# Patient Record
Sex: Female | Born: 1935 | Race: White | Hispanic: No | Smoking: Former smoker
Health system: Southern US, Community
[De-identification: ages and names within clinical notes are randomized; demographics above are authoritative.]

## PROBLEM LIST (undated history)

## (undated) DIAGNOSIS — M48061 Spinal stenosis, lumbar region without neurogenic claudication: Secondary | ICD-10-CM

## (undated) DIAGNOSIS — O88219 Thromboembolism in pregnancy, unspecified trimester: Secondary | ICD-10-CM

## (undated) DIAGNOSIS — I219 Acute myocardial infarction, unspecified: Secondary | ICD-10-CM

## (undated) DIAGNOSIS — E785 Hyperlipidemia, unspecified: Secondary | ICD-10-CM

## (undated) DIAGNOSIS — M1990 Unspecified osteoarthritis, unspecified site: Secondary | ICD-10-CM

## (undated) DIAGNOSIS — N6019 Diffuse cystic mastopathy of unspecified breast: Secondary | ICD-10-CM

## (undated) DIAGNOSIS — Z9889 Other specified postprocedural states: Secondary | ICD-10-CM

## (undated) DIAGNOSIS — K2970 Gastritis, unspecified, without bleeding: Secondary | ICD-10-CM

## (undated) HISTORY — PX: BREAST SURGERY: SHX581

## (undated) HISTORY — PX: ABDOMINAL HYSTERECTOMY: SHX81

## (undated) HISTORY — PX: APPENDECTOMY: SHX54

---
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
## 1978-09-12 DIAGNOSIS — Z9889 Other specified postprocedural states: Secondary | ICD-10-CM

## 1978-09-12 HISTORY — PX: GUM SURGERY: SHX658

## 1978-09-12 HISTORY — PX: KNEE ARTHROSCOPY: SHX127

## 1978-09-12 HISTORY — DX: Other specified postprocedural states: Z98.890

## 2004-05-08 ENCOUNTER — Ambulatory Visit: Payer: Self-pay | Admitting: Internal Medicine

## 2004-05-18 ENCOUNTER — Ambulatory Visit: Payer: Self-pay | Admitting: Internal Medicine

## 2005-01-11 HISTORY — PX: CARPAL TUNNEL RELEASE: SHX101

## 2005-03-12 ENCOUNTER — Ambulatory Visit: Payer: Self-pay | Admitting: Orthopaedic Surgery

## 2005-05-18 ENCOUNTER — Ambulatory Visit: Payer: Self-pay | Admitting: Internal Medicine

## 2005-05-19 ENCOUNTER — Encounter: Payer: Self-pay | Admitting: Orthopaedic Surgery

## 2005-06-11 ENCOUNTER — Encounter: Payer: Self-pay | Admitting: Orthopaedic Surgery

## 2005-08-06 ENCOUNTER — Ambulatory Visit: Payer: Self-pay | Admitting: Unknown Physician Specialty

## 2005-09-29 ENCOUNTER — Emergency Department: Payer: Self-pay | Admitting: Emergency Medicine

## 2006-01-11 DIAGNOSIS — I219 Acute myocardial infarction, unspecified: Secondary | ICD-10-CM

## 2006-01-11 HISTORY — DX: Acute myocardial infarction, unspecified: I21.9

## 2006-05-26 ENCOUNTER — Ambulatory Visit: Payer: Self-pay | Admitting: Internal Medicine

## 2006-06-09 ENCOUNTER — Ambulatory Visit: Payer: Self-pay | Admitting: Internal Medicine

## 2007-06-13 ENCOUNTER — Ambulatory Visit: Payer: Self-pay | Admitting: Internal Medicine

## 2007-12-27 ENCOUNTER — Ambulatory Visit: Payer: Self-pay | Admitting: Surgery

## 2008-01-03 ENCOUNTER — Ambulatory Visit: Payer: Self-pay | Admitting: Surgery

## 2008-06-13 ENCOUNTER — Ambulatory Visit: Payer: Self-pay | Admitting: Internal Medicine

## 2009-05-29 ENCOUNTER — Ambulatory Visit: Payer: Self-pay | Admitting: Internal Medicine

## 2010-06-04 ENCOUNTER — Ambulatory Visit: Payer: Self-pay | Admitting: Internal Medicine

## 2012-04-11 ENCOUNTER — Encounter: Payer: Self-pay | Admitting: Specialist

## 2012-05-11 ENCOUNTER — Encounter: Payer: Self-pay | Admitting: Specialist

## 2014-03-21 ENCOUNTER — Ambulatory Visit: Payer: Self-pay | Admitting: Unknown Physician Specialty

## 2014-03-26 ENCOUNTER — Encounter
Admit: 2014-03-26 | Disposition: A | Discharge status: Home / Self Care | Payer: Self-pay | Attending: Family Medicine | Admitting: Family Medicine

## 2014-04-12 ENCOUNTER — Encounter
Admit: 2014-04-12 | Disposition: A | Discharge status: Home / Self Care | Payer: Self-pay | Attending: Family Medicine | Admitting: Family Medicine

## 2014-06-11 ENCOUNTER — Other Ambulatory Visit: Payer: Self-pay | Admitting: Internal Medicine

## 2014-06-11 DIAGNOSIS — M5416 Radiculopathy, lumbar region: Secondary | ICD-10-CM

## 2014-06-17 ENCOUNTER — Ambulatory Visit
Admission: RE | Admit: 2014-06-17 | Discharge: 2014-06-17 | Disposition: A | Discharge status: Home / Self Care | Payer: Medicare Other | Source: Ambulatory Visit | Attending: Internal Medicine | Admitting: Internal Medicine

## 2014-06-17 DIAGNOSIS — M438X6 Other specified deforming dorsopathies, lumbar region: Secondary | ICD-10-CM | POA: Diagnosis not present

## 2014-06-17 DIAGNOSIS — M5416 Radiculopathy, lumbar region: Secondary | ICD-10-CM

## 2014-06-17 DIAGNOSIS — M5116 Intervertebral disc disorders with radiculopathy, lumbar region: Secondary | ICD-10-CM | POA: Diagnosis not present

## 2014-06-17 DIAGNOSIS — M4806 Spinal stenosis, lumbar region: Secondary | ICD-10-CM | POA: Insufficient documentation

## 2015-03-26 ENCOUNTER — Encounter
Admission: RE | Admit: 2015-03-26 | Discharge: 2015-03-26 | Disposition: A | Discharge status: Home / Self Care | Payer: Medicare Other | Source: Ambulatory Visit | Attending: Orthopedic Surgery | Admitting: Orthopedic Surgery

## 2015-03-26 DIAGNOSIS — Z01812 Encounter for preprocedural laboratory examination: Secondary | ICD-10-CM | POA: Diagnosis present

## 2015-03-26 HISTORY — DX: Acute myocardial infarction, unspecified: I21.9

## 2015-03-26 HISTORY — DX: Other specified postprocedural states: Z98.890

## 2015-03-26 HISTORY — DX: Thromboembolism in pregnancy, unspecified trimester: O88.219

## 2015-03-26 HISTORY — DX: Diffuse cystic mastopathy of unspecified breast: N60.19

## 2015-03-26 HISTORY — DX: Hyperlipidemia, unspecified: E78.5

## 2015-03-26 HISTORY — DX: Unspecified osteoarthritis, unspecified site: M19.90

## 2015-03-26 HISTORY — DX: Gastritis, unspecified, without bleeding: K29.70

## 2015-03-26 HISTORY — DX: Spinal stenosis, lumbar region without neurogenic claudication: M48.061

## 2015-03-26 LAB — SURGICAL PCR SCREEN
MRSA, PCR: NEGATIVE
Staphylococcus aureus: NEGATIVE

## 2015-03-26 LAB — URINALYSIS COMPLETE WITH MICROSCOPIC (ARMC ONLY)
BACTERIA UA: NONE SEEN
Bilirubin Urine: NEGATIVE
Glucose, UA: NEGATIVE mg/dL
HGB URINE DIPSTICK: NEGATIVE
Ketones, ur: NEGATIVE mg/dL
LEUKOCYTES UA: NEGATIVE
NITRITE: NEGATIVE
PH: 8 (ref 5.0–8.0)
PROTEIN: NEGATIVE mg/dL
SQUAMOUS EPITHELIAL / LPF: NONE SEEN
Specific Gravity, Urine: 1.006 (ref 1.005–1.030)
WBC, UA: NONE SEEN WBC/hpf (ref 0–5)

## 2015-03-26 LAB — TYPE AND SCREEN
ABO/RH(D): B POS
ANTIBODY SCREEN: NEGATIVE

## 2015-03-26 LAB — BASIC METABOLIC PANEL
Anion gap: 8 (ref 5–15)
BUN: 12 mg/dL (ref 6–20)
CHLORIDE: 101 mmol/L (ref 101–111)
CO2: 28 mmol/L (ref 22–32)
CREATININE: 0.77 mg/dL (ref 0.44–1.00)
Calcium: 9.3 mg/dL (ref 8.9–10.3)
Glucose, Bld: 97 mg/dL (ref 65–99)
POTASSIUM: 4.2 mmol/L (ref 3.5–5.1)
SODIUM: 137 mmol/L (ref 135–145)

## 2015-03-26 LAB — CBC
HCT: 41.8 % (ref 35.0–47.0)
HEMOGLOBIN: 14.3 g/dL (ref 12.0–16.0)
MCH: 31.6 pg (ref 26.0–34.0)
MCHC: 34.3 g/dL (ref 32.0–36.0)
MCV: 92.1 fL (ref 80.0–100.0)
PLATELETS: 243 10*3/uL (ref 150–440)
RBC: 4.54 MIL/uL (ref 3.80–5.20)
RDW: 12.9 % (ref 11.5–14.5)
WBC: 5 10*3/uL (ref 3.6–11.0)

## 2015-03-26 LAB — SEDIMENTATION RATE: SED RATE: 11 mm/h (ref 0–30)

## 2015-03-26 LAB — PROTIME-INR
INR: 0.99
PROTHROMBIN TIME: 13.3 s (ref 11.4–15.0)

## 2015-03-26 LAB — ABO/RH: ABO/RH(D): B POS

## 2015-03-26 LAB — APTT: aPTT: 25 seconds (ref 24–36)

## 2015-03-26 NOTE — Patient Instructions (Signed)
  Your procedure is scheduled on: Wednesday April 09 2015. Report to Same Day Surgery. To find out your arrival time please call [phone] between 1PM - 3PM on Tuesday April 08, 2015.  Remember: Instructions that are not followed completely may result in serious medical risk, up to and including death, or upon the discretion of your surgeon and anesthesiologist your surgery may need to be rescheduled.    _x___ 1. Do not eat food or drink liquids after midnight. No gum chewing or hard candies.     ____ 2. No Alcohol for 24 hours before or after surgery.   ____ 3. Bring all medications with you on the day of surgery if instructed.    __x__ 4. Notify your doctor if there is any change in your medical condition     (cold, fever, infections).     Do not wear jewelry, make-up, hairpins, clips or nail polish.  Do not wear lotions, powders, or perfumes. You may wear deodorant.  Do not shave 48 hours prior to surgery. Men may shave face and neck.  Do not bring valuables to the hospital.    [facility] is not responsible for any belongings or valuables.               Contacts, dentures or bridgework may not be worn into surgery.  Leave your suitcase in the car. After surgery it may be brought to your room.  For patients admitted to the hospital, discharge time is determined by your treatment team.   Patients discharged the day of surgery will not be allowed to drive home.    Please read over the following fact sheets that you were given:   [facility] Preparing for Surgery  __x__ Take these medicines the morning of surgery with A SIP OF WATER:    1. gabapentin (NEURONTIN)    ____ Fleet Enema (as directed)   _x___ Use CHG Soap as directed  ____ Use inhalers on the day of surgery  ____ Stop metformin 2 days prior to surgery    ____ Take 1/2 of usual insulin dose the night before surgery and none on the morning of surgery.   ____ Stop Coumadin/Plavix/aspirin on does not  apply.  _x___ Stop Anti-inflammatories on Advil, aleve, ibuprofen, Motrin, aspirin products.  OK to take Tylenol for pain.   _x___ Stop supplements Vitamin C and Multivitamin until after surgery.    ____ Bring C-Pap to the hospital.

## 2015-03-26 NOTE — Pre-Procedure Instructions (Signed)
ECG 12-lead3/09/2015  [place]  ECG 12-lead3/09/2015  [place]  Component Name Value Range  Vent Rate (bpm) 82   PR Interval (msec) 158   QRS Interval (msec) 106   QT Interval (msec) 356   QTc (msec) 415    Result Narrative  Normal sinus rhythm Incomplete right bundle branch block ST and T wave abnormality, consider anterolateral ischemia Abnormal ECG , no significant change from prior ECG When compared with ECG of 01-Jul-2014 14:08, Criteria for Septal infarct are no longer present I reviewed and concur with this report. Electronically signed [id number] MD, [place] [phone]) on 03/25/2015 6:35:18 PM   Status Results Details   Encounter Summary

## 2015-03-26 NOTE — Pre-Procedure Instructions (Signed)
Pt reports she has 4 small sores on her left lower leg.  2 look as if they are healing and scabbed over, 2 look pink, swollen and open, no drainage noted.  Spoke with Margaretha Sheffield Dr. Clydell Hakim nurse regarding sore, she instructed me to bring pt over for Barnet Pall to look at sore ASAP. Took pt and daughter in law over to see Barnet Pall.  Wille Glaser said the sore look ok and we can proceed with surgery.

## 2015-03-27 NOTE — Pre-Procedure Instructions (Signed)
Medical clearance from Dr. Ouida Sills is on chart given to this nurse by patient and daughter in law.

## 2015-03-28 LAB — URINE CULTURE: SPECIAL REQUESTS: NORMAL

## 2015-03-31 NOTE — Pre-Procedure Instructions (Signed)
URINE CULTURE FAXED TO DR Maree Krabbe OFFICE

## 2015-04-02 NOTE — Pre-Procedure Instructions (Signed)
NO REPEAT URINE CULTURE NEEDED PER ELINE RN AT DR Maree Krabbe OFFICE

## 2015-04-09 ENCOUNTER — Encounter: Admission: RE | Payer: Self-pay | Source: Ambulatory Visit

## 2015-04-09 ENCOUNTER — Inpatient Hospital Stay: Admission: RE | Admit: 2015-04-09 | Payer: Medicare Other | Source: Ambulatory Visit | Admitting: Orthopedic Surgery

## 2015-04-09 SURGERY — ARTHROPLASTY, KNEE, TOTAL, USING IMAGELESS COMPUTER-ASSISTED NAVIGATION
Anesthesia: Choice | Laterality: Left

## 2016-06-08 ENCOUNTER — Ambulatory Visit
Admission: RE | Admit: 2016-06-08 | Discharge: 2016-06-08 | Disposition: A | Discharge status: Home / Self Care | Payer: Medicare Other | Source: Ambulatory Visit | Attending: Family Medicine | Admitting: Family Medicine

## 2016-06-08 ENCOUNTER — Other Ambulatory Visit: Payer: Self-pay | Admitting: Family Medicine

## 2016-06-08 DIAGNOSIS — M25552 Pain in left hip: Secondary | ICD-10-CM

## 2016-06-08 DIAGNOSIS — M8580 Other specified disorders of bone density and structure, unspecified site: Secondary | ICD-10-CM | POA: Insufficient documentation

## 2016-06-08 DIAGNOSIS — M25452 Effusion, left hip: Secondary | ICD-10-CM | POA: Insufficient documentation

## 2016-06-24 ENCOUNTER — Other Ambulatory Visit: Payer: Self-pay | Admitting: Internal Medicine

## 2016-06-24 ENCOUNTER — Ambulatory Visit
Admission: RE | Admit: 2016-06-24 | Discharge: 2016-06-24 | Disposition: A | Discharge status: Home / Self Care | Payer: Medicare Other | Source: Ambulatory Visit | Attending: Internal Medicine | Admitting: Internal Medicine

## 2016-06-24 DIAGNOSIS — M5416 Radiculopathy, lumbar region: Secondary | ICD-10-CM

## 2016-06-24 DIAGNOSIS — M48061 Spinal stenosis, lumbar region without neurogenic claudication: Secondary | ICD-10-CM | POA: Diagnosis not present

## 2016-08-13 ENCOUNTER — Emergency Department: Payer: Medicare Other

## 2016-08-13 ENCOUNTER — Encounter: Payer: Self-pay | Admitting: Intensive Care

## 2016-08-13 ENCOUNTER — Inpatient Hospital Stay
Admission: EM | Admit: 2016-08-13 | Discharge: 2016-08-16 | DRG: 470 | Disposition: A | Discharge status: Skilled Nursing Facility | Payer: Medicare Other | Attending: Internal Medicine | Admitting: Internal Medicine

## 2016-08-13 DIAGNOSIS — S72002A Fracture of unspecified part of neck of left femur, initial encounter for closed fracture: Secondary | ICD-10-CM | POA: Diagnosis present

## 2016-08-13 DIAGNOSIS — S72012A Unspecified intracapsular fracture of left femur, initial encounter for closed fracture: Principal | ICD-10-CM | POA: Diagnosis present

## 2016-08-13 DIAGNOSIS — W06XXXA Fall from bed, initial encounter: Secondary | ICD-10-CM | POA: Diagnosis present

## 2016-08-13 DIAGNOSIS — Z87891 Personal history of nicotine dependence: Secondary | ICD-10-CM | POA: Diagnosis not present

## 2016-08-13 DIAGNOSIS — M810 Age-related osteoporosis without current pathological fracture: Secondary | ICD-10-CM | POA: Diagnosis present

## 2016-08-13 DIAGNOSIS — Y92129 Unspecified place in nursing home as the place of occurrence of the external cause: Secondary | ICD-10-CM

## 2016-08-13 DIAGNOSIS — Z09 Encounter for follow-up examination after completed treatment for conditions other than malignant neoplasm: Secondary | ICD-10-CM

## 2016-08-13 DIAGNOSIS — I252 Old myocardial infarction: Secondary | ICD-10-CM

## 2016-08-13 LAB — COMPREHENSIVE METABOLIC PANEL
ALBUMIN: 3.4 g/dL — AB (ref 3.5–5.0)
ALK PHOS: 62 U/L (ref 38–126)
ALT: 18 U/L (ref 14–54)
ANION GAP: 6 (ref 5–15)
AST: 25 U/L (ref 15–41)
BUN: 19 mg/dL (ref 6–20)
CALCIUM: 8.8 mg/dL — AB (ref 8.9–10.3)
CHLORIDE: 102 mmol/L (ref 101–111)
CO2: 28 mmol/L (ref 22–32)
Creatinine, Ser: 0.85 mg/dL (ref 0.44–1.00)
GFR calc Af Amer: >60 mL/min (ref >60)
GFR calc non Af Amer: 59 mL/min — ABNORMAL LOW (ref >60)
Glucose, Bld: 98 mg/dL (ref 65–99)
Potassium: 4.2 mmol/L (ref 3.5–5.1)
SODIUM: 136 mmol/L (ref 135–145)
Total Bilirubin: 0.4 mg/dL (ref 0.3–1.2)
Total Protein: 6.9 g/dL (ref 6.5–8.1)

## 2016-08-13 LAB — URINALYSIS, ROUTINE W REFLEX MICROSCOPIC
Bilirubin Urine: NEGATIVE
GLUCOSE, UA: NEGATIVE mg/dL
HGB URINE DIPSTICK: NEGATIVE
KETONES UR: NEGATIVE mg/dL
NITRITE: NEGATIVE
PROTEIN: NEGATIVE mg/dL
RBC / HPF: NONE SEEN RBC/hpf (ref 0–5)
Specific Gravity, Urine: 1.005 (ref 1.005–1.030)
pH: 7 (ref 5.0–8.0)

## 2016-08-13 LAB — CBC WITH DIFFERENTIAL/PLATELET
BASOS PCT: 1 %
Basophils Absolute: 0.1 10*3/uL (ref 0–0.1)
EOS ABS: 0.1 10*3/uL (ref 0–0.7)
Eosinophils Relative: 2 %
HCT: 37.4 % (ref 35.0–47.0)
HEMOGLOBIN: 12.7 g/dL (ref 12.0–16.0)
Lymphocytes Relative: 14 %
Lymphs Abs: 1.1 10*3/uL (ref 1.0–3.6)
MCH: 31.3 pg (ref 26.0–34.0)
MCHC: 34 g/dL (ref 32.0–36.0)
MCV: 92.2 fL (ref 80.0–100.0)
Monocytes Absolute: 1 10*3/uL — ABNORMAL HIGH (ref 0.2–0.9)
Monocytes Relative: 12 %
NEUTROS PCT: 71 %
Neutro Abs: 5.6 10*3/uL (ref 1.4–6.5)
Platelets: 365 10*3/uL (ref 150–440)
RBC: 4.05 MIL/uL (ref 3.80–5.20)
RDW: 13.5 % (ref 11.5–14.5)
WBC: 7.8 10*3/uL (ref 3.6–11.0)

## 2016-08-13 LAB — SURGICAL PCR SCREEN
MRSA, PCR: NEGATIVE
Staphylococcus aureus: NEGATIVE

## 2016-08-13 MED ORDER — VITAMIN C 500 MG PO TABS
500.0000 mg | ORAL_TABLET | ORAL | Status: DC
  Filled 2016-08-13: qty 1

## 2016-08-13 MED ORDER — ONDANSETRON HCL 4 MG/2ML IJ SOLN: 4.0000 mg | Freq: Four times a day (QID) | INTRAMUSCULAR | Status: DC | PRN

## 2016-08-13 MED ORDER — ONDANSETRON HCL 4 MG PO TABS: 4.0000 mg | ORAL_TABLET | Freq: Four times a day (QID) | ORAL | Status: DC | PRN

## 2016-08-13 MED ORDER — MORPHINE SULFATE (PF) 2 MG/ML IV SOLN: 2.0000 mg | INTRAVENOUS | Status: DC | PRN

## 2016-08-13 MED ORDER — DOCUSATE SODIUM 100 MG PO CAPS
200.0000 mg | ORAL_CAPSULE | Freq: Every day | ORAL | Status: DC
  Administered 2016-08-13: 200 mg via ORAL
  Filled 2016-08-13: qty 2

## 2016-08-13 MED ORDER — ACETAMINOPHEN 325 MG PO TABS: 650.0000 mg | ORAL_TABLET | Freq: Four times a day (QID) | ORAL | Status: DC | PRN

## 2016-08-13 MED ORDER — CALCIUM CARBONATE-VITAMIN D 500-200 MG-UNIT PO TABS
1.0000 | ORAL_TABLET | Freq: Two times a day (BID) | ORAL | Status: DC
  Administered 2016-08-13: 22:00:00 1 via ORAL
  Filled 2016-08-13: qty 1

## 2016-08-13 MED ORDER — DICLOFENAC SODIUM 50 MG PO TBEC
50.0000 mg | DELAYED_RELEASE_TABLET | Freq: Two times a day (BID) | ORAL | Status: DC
  Administered 2016-08-13: 50 mg via ORAL
  Filled 2016-08-13 (×2): qty 1

## 2016-08-13 MED ORDER — SENNA 8.6 MG PO TABS: 1.0000 | ORAL_TABLET | Freq: Every day | ORAL | Status: DC | PRN

## 2016-08-13 MED ORDER — ACETAMINOPHEN 650 MG RE SUPP: 650.0000 mg | Freq: Four times a day (QID) | RECTAL | Status: DC | PRN

## 2016-08-13 NOTE — ED Notes (Signed)
Patient assisted to use bedpan. External catheter replaced.

## 2016-08-13 NOTE — ED Provider Notes (Signed)
[facility] Emergency Department Provider Note  ____________________________________________   I have reviewed the triage vital signs and the nursing notes.   HISTORY  Chief Complaint Hip Injury   History limited by: Dementia, some history obtained from son   HPI Misty Lucas is a 83 y.o. female who presents to the emergency department today because of concerns for left hip fracture. The son states that she had a fall sliding out of her bed roughly 1.5 weeks ago. Since that time she has been having more pain. She has been able to walk with a walker. A number of months ago she had a fall and was having left hip pain since that time. Had imaging done with that injury. Today patient was sent to [facility] clinic where x-rays showed a hip fracture.     Past Medical History:  Diagnosis Date  . Arthritis   . Blood clot embolism during pregnancy, antepartum 1957   left thigh  . Fibrocystic breast    left breast  . Gastritis   . History of arthroscopy of knee 1980's   right  . Hyperlipemia   . Lumbar stenosis    2 buldging disc  . Myocardial infarction [facility]) 2008    There are no active problems to display for this patient.   Past Surgical History:  Procedure Laterality Date  . ABDOMINAL HYSTERECTOMY    . APPENDECTOMY    . BREAST SURGERY    . CARPAL TUNNEL RELEASE Bilateral 2007  . GUM SURGERY  1980's  . KNEE ARTHROSCOPY Bilateral 1980's    Prior to Admission medications   Medication Sig Start Date End Date Taking? Authorizing Provider  acetaminophen (TYLENOL) 500 MG tablet Take 1,000 mg by mouth daily as needed.    [provider]  Ascorbic Acid (VITAMIN C WITH ROSE HIPS) 500 MG tablet Take 500 mg by mouth every morning.    [provider]  Calcium Carb-Cholecalciferol (OS-CAL CALCIUM + D3) 500-200 MG-UNIT TABS Take 1 tablet by mouth every morning.    [provider]  docusate sodium (COLACE) 100 MG capsule Take 200 mg by  mouth as needed for mild constipation.    [provider]  gabapentin (NEURONTIN) 100 MG capsule Take 100 mg by mouth 2 (two) times daily.    [provider]  Multiple Vitamins-Minerals (CENTRUM SILVER ULTRA WOMENS PO) Take 1 tablet by mouth every morning.    [provider]  Sennosides 25 MG TABS Take 2 tablets by mouth as needed.    [provider]    Allergies Carafate [sucralfate]; Lipitor [atorvastatin]; Mobic [meloxicam]; Penicillins; and Tramadol  History reviewed. No pertinent family history.  Social History Social History  Substance Use Topics  . Smoking status: Former Smoker    Quit date: 03/26/1967  . Smokeless tobacco: Not on file  . Alcohol use No    Review of Systems Constitutional: No fever/chills Eyes: No visual changes. ENT: No sore throat. Cardiovascular: Denies chest pain. Respiratory: Denies shortness of breath. Gastrointestinal: No abdominal pain.  No nausea, no vomiting.  No diarrhea.   Genitourinary: Negative for dysuria. Musculoskeletal: Pain to left hip and left leg. Skin: Negative for rash. Neurological: Negative for headaches, focal weakness or numbness.  ____________________________________________   PHYSICAL EXAM:  VITAL SIGNS: ED Triage Vitals  Enc Vitals Group     BP 08/13/16 1550 (!) 169/69     Pulse Rate 08/13/16 1550 82     Resp 08/13/16 1550 16  Temp 08/13/16 1550 97.9 F (36.6 C)     Temp Source 08/13/16 1550 Oral     SpO2 08/13/16 1550 97 %     Weight 08/13/16 1546 100 lb (45.4 kg)     Height 08/13/16 1546 5' (1.524 m)     Head Circumference --      Peak Flow --      Pain Score 08/13/16 1545 4     Pain Loc --      Pain Edu? --      Excl. in [place]? --      Constitutional: Awake and alert. No acute distress. Eyes: Conjunctivae are normal.  ENT   Head: Normocephalic and atraumatic.   Nose: No congestion/rhinnorhea.   Mouth/Throat: Mucous membranes are moist.   Neck: No  stridor. Hematological/Lymphatic/Immunilogical: No cervical lymphadenopathy. Cardiovascular: Normal rate, regular rhythm.  No murmurs, rubs, or gallops.  Respiratory: Normal respiratory effort without tachypnea nor retractions. Breath sounds are clear and equal bilaterally. No wheezes/rales/rhonchi. Gastrointestinal: Soft and non tender. No rebound. No guarding.  Genitourinary: Deferred Musculoskeletal: left leg shortened and externally rotated. Tender to manipulation and palpation of the left hip. DP 2+. Sensation grossly intact.  Neurologic:  Normal speech and language. No gross focal neurologic deficits are appreciated.  Skin:  Skin is warm, dry and intact. No rash noted. Psychiatric: Mood and affect are normal. Speech and behavior are normal. Patient exhibits appropriate insight and judgment.  ____________________________________________    LABS (pertinent positives/negatives)  Labs Reviewed  CBC WITH DIFFERENTIAL/PLATELET - Abnormal; Notable for the following:       Result Value   Monocytes Absolute 1.0 (*)    All other components within normal limits  COMPREHENSIVE METABOLIC PANEL - Abnormal; Notable for the following:    Calcium 8.8 (*)    Albumin 3.4 (*)    GFR calc non Af Amer 59 (*)    All other components within normal limits     ____________________________________________   EKG  None  ____________________________________________    RADIOLOGY  Left hip IMPRESSION: Subcapital left hip fracture could be acute but has an appearance suggestive of subacute injury. Flattening and sclerosis of the left femoral head is compatible with avascular necrosis.  CXR  IMPRESSION: No acute abnormality. Mild changes of COPD.  ____________________________________________   PROCEDURES  Procedures  ____________________________________________   INITIAL IMPRESSION / ASSESSMENT AND PLAN / ED COURSE  Pertinent labs & imaging results that were available during my  care of the patient were reviewed by me and considered in my medical decision making (see chart for details).  Patient presents to the emergency department today because of concerns for left hip fracture noted on x-ray performed at [facility] clinic. On exam patient's leg is shortened and rotated. Dorsalis pedis pulses 2+. Patient will be admitted to hospitalist service.  ____________________________________________   FINAL CLINICAL IMPRESSION(S) / ED DIAGNOSES  Final diagnoses:  Closed fracture of left hip, initial encounter [facility])     Note: This dictation was prepared with Dragon dictation. Any transcriptional errors that result from this process are unintentional     Nance Pear, MD 08/13/16 1901

## 2016-08-13 NOTE — H&P (Signed)
[facility] [place] NAME: Misty Lucas    MR#:  [id number]  DATE OF BIRTH:  28-Jul-1924  DATE OF ADMISSION:  08/13/2016  PRIMARY CARE PHYSICIAN: Rusty Aus, MD   REQUESTING/REFERRING PHYSICIAN: Dr. Nance Pear  CHIEF COMPLAINT:   Chief Complaint  Patient presents with  . Hip Injury    HISTORY OF PRESENT ILLNESS:  Misty Lucas  is a 83 y.o. female with a known history of Hyperlipidemia, lumbar stenosis, osteoarthritis, history of an antepartum embolism who presents to the hospital due to left hip pain when ambulating. Patient stated at of her bed about a week or so ago and fell to the ground twice. She was doing fine until the past few days she's been having significant pain when ambulating. She was sent to the orthopedic doctor at [facility] clinic for evaluation and they did x-rays and found her to have a left hip fracture and sent to the ER for further evaluation. Patient does complain of some mild left hip pain on ambulation but no other associated symptoms. She denies any chest pain shortness of breath nausea vomiting fevers chills cough or any other associated symptoms presently.  PAST MEDICAL HISTORY:   Past Medical History:  Diagnosis Date  . Arthritis   . Blood clot embolism during pregnancy, antepartum 1957   left thigh  . Fibrocystic breast    left breast  . Gastritis   . History of arthroscopy of knee 1980's   right  . Hyperlipemia   . Lumbar stenosis    2 buldging disc  . Myocardial infarction [facility]) 2008    PAST SURGICAL HISTORY:   Past Surgical History:  Procedure Laterality Date  . ABDOMINAL HYSTERECTOMY    . APPENDECTOMY    . BREAST SURGERY    . CARPAL TUNNEL RELEASE Bilateral 2007  . GUM SURGERY  1980's  . KNEE ARTHROSCOPY Bilateral 1980's    SOCIAL HISTORY:   Social History  Substance Use Topics  . Smoking status: Former Smoker    Types: Cigarettes    Quit date: 03/26/1967  . Smokeless tobacco: Not  on file  . Alcohol use No    FAMILY HISTORY:   Family History  Problem Relation Age of Onset  . Heart attack Mother   . Tuberculosis Father   . Dementia Father     DRUG ALLERGIES:   Allergies  Allergen Reactions  . Carafate [Sucralfate] Other (See Comments)    Unknown, pt does not remember  . Lipitor [Atorvastatin] Other (See Comments)    Unknown, pt can't remember   . Mobic [Meloxicam] Other (See Comments)    Pt does not remember  . Penicillins Other (See Comments)    Pt does not remember  . Tramadol Other (See Comments)    Pt does not remember    REVIEW OF SYSTEMS:   Review of Systems  Constitutional: Negative for chills, fever and weight loss.  HENT: Negative for congestion, nosebleeds and tinnitus.   Eyes: Negative for blurred vision, double vision and redness.  Respiratory: Negative for cough, hemoptysis, shortness of breath and wheezing.   Cardiovascular: Negative for chest pain, orthopnea, leg swelling and PND.  Gastrointestinal: Negative for abdominal pain, diarrhea, melena, nausea and vomiting.  Genitourinary: Negative for dysuria, hematuria and urgency.  Musculoskeletal: Positive for falls and joint pain (Left Hip).  Neurological: Negative for dizziness, tingling, sensory change, focal weakness, seizures, weakness and headaches.  Endo/Heme/Allergies: Negative for polydipsia. Does not bruise/bleed easily.  Psychiatric/Behavioral: Negative for depression and memory loss. The patient is not nervous/anxious.   All other systems reviewed and are negative.   MEDICATIONS AT HOME:   Prior to Admission medications   Medication Sig Start Date End Date Taking? Authorizing Provider  acetaminophen (TYLENOL) 500 MG tablet Take 1,000 mg by mouth 3 (three) times daily.    Yes [provider]  Ascorbic Acid (VITAMIN C WITH ROSE HIPS) 500 MG tablet Take 500 mg by mouth every morning.   Yes [provider]  Calcium Carb-Cholecalciferol (OS-CAL CALCIUM +  D3) 500-200 MG-UNIT TABS Take 1 tablet by mouth 2 (two) times daily.    Yes [provider]  diclofenac (VOLTAREN) 50 MG EC tablet Take 50 mg by mouth 2 (two) times daily.   Yes [provider]  docusate sodium (COLACE) 100 MG capsule Take 200 mg by mouth at bedtime.    Yes [provider]  Multiple Vitamins-Minerals (CENTRUM SILVER ULTRA WOMENS PO) Take 1 tablet by mouth every morning.   Yes [provider]  senna (SENOKOT) 8.6 MG TABS tablet Take 1 tablet by mouth daily as needed for mild constipation.   Yes [provider]      VITAL SIGNS:  Blood pressure (!) 169/69, pulse 82, temperature 97.9 F (36.6 C), temperature source Oral, resp. rate 16, height 5' (1.524 m), weight 45.4 kg (100 lb), SpO2 97 %.  PHYSICAL EXAMINATION:  Physical Exam  GENERAL:  83 y.o.-year-old patient lying in the bed in no acute distress.  EYES: Pupils equal, round, reactive to light and accommodation. No scleral icterus. Extraocular muscles intact.  HEENT: Head atraumatic, normocephalic. Oropharynx and nasopharynx clear. No oropharyngeal erythema, moist oral mucosa  NECK:  Supple, no jugular venous distention. No thyroid enlargement, no tenderness.  LUNGS: Normal breath sounds bilaterally, no wheezing, rales, rhonchi. No use of accessory muscles of respiration.  CARDIOVASCULAR: S1, S2 RRR. No murmurs, rubs, gallops, clicks.  ABDOMEN: Soft, nontender, nondistended. Bowel sounds present. No organomegaly or mass.  EXTREMITIES: No pedal edema, cyanosis, or clubbing. + 2 pedal & radial pulses b/l.  Left lower extremities shortened and externally Rotated due to her fracture. NEUROLOGIC: Cranial nerves II through XII are intact. No focal Motor or sensory deficits appreciated b/l PSYCHIATRIC: The patient is alert and oriented x 3. SKIN: No obvious rash, lesion, or ulcer.   LABORATORY PANEL:   CBC  Recent Labs Lab 08/13/16 1630  WBC 7.8  HGB 12.7  HCT 37.4  PLT 365    ------------------------------------------------------------------------------------------------------------------  Chemistries   Recent Labs Lab 08/13/16 1630  NA 136  K 4.2  CL 102  CO2 28  GLUCOSE 98  BUN 19  CREATININE 0.85  CALCIUM 8.8*  AST 25  ALT 18  ALKPHOS 62  BILITOT 0.4   ------------------------------------------------------------------------------------------------------------------  Cardiac Enzymes No results for input(s): TROPONINI in the last 168 hours. ------------------------------------------------------------------------------------------------------------------  RADIOLOGY:  Dg Chest 1 View  Result Date: 08/13/2016 CLINICAL DATA:  Left hip fracture.  Fell 2 weeks ago. EXAM: CHEST 1 VIEW COMPARISON:  None. FINDINGS: Heart size near the upper limit of normal. Tortuous aorta. Mildly hyperexpanded lungs with mildly prominent interstitial markings. Mild to moderate levoconvex thoracolumbar scoliosis and degenerative changes. Diffuse osteopenia. IMPRESSION: No acute abnormality.  Mild changes of COPD. Electronically Signed   By: Claudie Revering M.D.   On: 08/13/2016 18:40   Dg Hip Unilat W Or Wo Pelvis 2-3 Views Left  Result Date: 08/13/2016 CLINICAL DATA:  Status post fall 2 weeks  ago.  Left hip fracture. EXAM: DG HIP (WITH OR WITHOUT PELVIS) 2-3V LEFT COMPARISON:  CT left hip 06/08/2017. FINDINGS: The patient has a subcapital left hip fracture. Some fracture margins appear somewhat corticated suggesting subacute injury. The femoral head remnant is somewhat flattened and sclerotic compatible with necrosis. No other fracture is identified. The right hip is located. IMPRESSION: Subcapital left hip fracture could be acute but has an appearance suggestive of subacute injury. Flattening and sclerosis of the left femoral head is compatible with avascular necrosis. Electronically Signed   By: Inge Rise M.D.   On: 08/13/2016 18:41     IMPRESSION AND PLAN:    83 year old female with past medical history of osteoporosis, fibrocystic breast disease, hyperlipidemia, lumbar stenosis who presents to the hospital due to left hip pain and noted to have a left hip fracture.  1. Preoperative cardiovascular examination-patient is a low cardiac risk for noncardiac surgery. -Patient denies any previous history of MI or any previous CVA or any vascular disease. -We'll review preoperative EKG. No contraindications surgery at this time.  2. Left hip fracture-secondary to a mechanical fall about a week or so ago. Burnis Medin consult orthopedics, patient likely to go to the operating room tomorrow.  3. Osteoporosis-continue calcium with vitamin D supplements. -Continue diclofenac.    All the records are reviewed and case discussed with ED provider. Management plans discussed with the patient, family and they are in agreement.  CODE STATUS: Full code  TOTAL TIME TAKING CARE OF THIS PATIENT: 40 minutes.    Henreitta Leber M.D on 08/13/2016 at 7:06 PM  Between 7am to 6pm - Pager - [phone]  After 6pm go to [email or web address] - password [email or web address] [place] [facility]  Office  [phone]  CC: Primary care physician; Rusty Aus, MD

## 2016-08-13 NOTE — ED Notes (Signed)
Nurse on 1A will call this RN back when she is able to take report. Number given for call back. Pt is in NAD at this time.

## 2016-08-13 NOTE — ED Triage Notes (Signed)
Patient is from [facility] for confirmed L hip fracture. Xray done today showed fracture.

## 2016-08-14 ENCOUNTER — Encounter
Admission: EM | Disposition: A | Discharge status: Skilled Nursing Facility | Payer: Self-pay | Source: Home / Self Care | Attending: Internal Medicine

## 2016-08-14 ENCOUNTER — Encounter: Payer: Self-pay | Admitting: Anesthesiology

## 2016-08-14 ENCOUNTER — Inpatient Hospital Stay: Payer: Medicare Other

## 2016-08-14 ENCOUNTER — Inpatient Hospital Stay: Payer: Medicare Other | Admitting: Anesthesiology

## 2016-08-14 HISTORY — PX: HIP ARTHROPLASTY: SHX981

## 2016-08-14 LAB — CBC
HCT: 35.4 % (ref 35.0–47.0)
Hemoglobin: 12.2 g/dL (ref 12.0–16.0)
MCH: 31.7 pg (ref 26.0–34.0)
MCHC: 34.5 g/dL (ref 32.0–36.0)
MCV: 92.1 fL (ref 80.0–100.0)
PLATELETS: 343 10*3/uL (ref 150–440)
RBC: 3.84 MIL/uL (ref 3.80–5.20)
RDW: 13.5 % (ref 11.5–14.5)
WBC: 6.4 10*3/uL (ref 3.6–11.0)

## 2016-08-14 LAB — BASIC METABOLIC PANEL
Anion gap: 5 (ref 5–15)
BUN: 14 mg/dL (ref 6–20)
CO2: 27 mmol/L (ref 22–32)
CREATININE: 0.67 mg/dL (ref 0.44–1.00)
Calcium: 8.9 mg/dL (ref 8.9–10.3)
Chloride: 106 mmol/L (ref 101–111)
Glucose, Bld: 103 mg/dL — ABNORMAL HIGH (ref 65–99)
Potassium: 4 mmol/L (ref 3.5–5.1)
SODIUM: 138 mmol/L (ref 135–145)

## 2016-08-14 SURGERY — HEMIARTHROPLASTY, HIP, DIRECT ANTERIOR APPROACH, FOR FRACTURE
Anesthesia: Spinal | Laterality: Left

## 2016-08-14 MED ORDER — PHENOL 1.4 % MT LIQD
1.0000 | OROMUCOSAL | Status: DC | PRN
  Filled 2016-08-14: qty 177

## 2016-08-14 MED ORDER — ONDANSETRON HCL 4 MG PO TABS: 4.0000 mg | ORAL_TABLET | Freq: Four times a day (QID) | ORAL | Status: DC | PRN

## 2016-08-14 MED ORDER — ASPIRIN EC 325 MG PO TBEC
325.0000 mg | DELAYED_RELEASE_TABLET | Freq: Every day | ORAL | Status: DC
  Administered 2016-08-15 – 2016-08-16 (×2): 325 mg via ORAL
  Filled 2016-08-14 (×2): qty 1

## 2016-08-14 MED ORDER — TRANEXAMIC ACID 1000 MG/10ML IV SOLN
INTRAVENOUS | Status: DC | PRN
  Administered 2016-08-14: 1000 mg via INTRAVENOUS

## 2016-08-14 MED ORDER — BACITRACIN 50000 UNITS IM SOLR
INTRAMUSCULAR | Status: AC
  Filled 2016-08-14: qty 4

## 2016-08-14 MED ORDER — POVIDONE-IODINE 10 % EX SWAB: 2.0000 "application " | Freq: Once | CUTANEOUS | Status: DC

## 2016-08-14 MED ORDER — POLYETHYLENE GLYCOL 3350 17 G PO PACK: 17.0000 g | PACK | Freq: Every day | ORAL | Status: DC | PRN

## 2016-08-14 MED ORDER — FLEET ENEMA 7-19 GM/118ML RE ENEM: 1.0000 | ENEMA | Freq: Once | RECTAL | Status: DC | PRN

## 2016-08-14 MED ORDER — SENNA 8.6 MG PO TABS
1.0000 | ORAL_TABLET | Freq: Two times a day (BID) | ORAL | Status: DC
  Administered 2016-08-14 – 2016-08-16 (×4): 8.6 mg via ORAL
  Filled 2016-08-14 (×4): qty 1

## 2016-08-14 MED ORDER — LACTATED RINGERS IV SOLN
INTRAVENOUS | Status: DC
  Administered 2016-08-14 (×2): via INTRAVENOUS

## 2016-08-14 MED ORDER — PHENYLEPHRINE HCL 10 MG/ML IJ SOLN
INTRAMUSCULAR | Status: DC | PRN
  Administered 2016-08-14 (×2): 100 ug via INTRAVENOUS

## 2016-08-14 MED ORDER — ACETAMINOPHEN 500 MG PO TABS
1000.0000 mg | ORAL_TABLET | Freq: Four times a day (QID) | ORAL | Status: AC
  Administered 2016-08-14 (×2): 1000 mg via ORAL
  Filled 2016-08-14: qty 2

## 2016-08-14 MED ORDER — METOCLOPRAMIDE HCL 5 MG/ML IJ SOLN: 5.0000 mg | Freq: Three times a day (TID) | INTRAMUSCULAR | Status: DC | PRN

## 2016-08-14 MED ORDER — CLINDAMYCIN PHOSPHATE 600 MG/4ML IJ SOLN
600.0000 mg | Freq: Four times a day (QID) | INTRAMUSCULAR | Status: AC
  Administered 2016-08-14: 600 mg via INTRAVENOUS
  Administered 2016-08-14: 900 mg via INTRAVENOUS
  Filled 2016-08-14 (×2): qty 4

## 2016-08-14 MED ORDER — PROPOFOL 500 MG/50ML IV EMUL
INTRAVENOUS | Status: DC | PRN
  Administered 2016-08-14: 25 ug/kg/min via INTRAVENOUS

## 2016-08-14 MED ORDER — MORPHINE SULFATE (PF) 2 MG/ML IV SOLN
1.0000 mg | INTRAVENOUS | Status: DC | PRN
  Administered 2016-08-14: 1 mg via INTRAVENOUS
  Filled 2016-08-14: qty 1

## 2016-08-14 MED ORDER — CLINDAMYCIN PHOSPHATE 900 MG/50ML IV SOLN
900.0000 mg | INTRAVENOUS | Status: DC
  Filled 2016-08-14: qty 50

## 2016-08-14 MED ORDER — BACITRACIN 50000 UNITS IM SOLR
INTRAMUSCULAR | Status: DC | PRN
  Administered 2016-08-14: 3000 mL

## 2016-08-14 MED ORDER — MENTHOL 3 MG MT LOZG
1.0000 | LOZENGE | OROMUCOSAL | Status: DC | PRN
Start: 2016-08-14 — End: 2016-08-16
  Filled 2016-08-14: qty 9

## 2016-08-14 MED ORDER — ONDANSETRON HCL 4 MG/2ML IJ SOLN: 4.0000 mg | Freq: Four times a day (QID) | INTRAMUSCULAR | Status: DC | PRN

## 2016-08-14 MED ORDER — LACTATED RINGERS IV SOLN: INTRAVENOUS | Status: DC

## 2016-08-14 MED ORDER — CHLORHEXIDINE GLUCONATE 4 % EX LIQD: 60.0000 mL | Freq: Once | CUTANEOUS | Status: DC

## 2016-08-14 MED ORDER — CELECOXIB 200 MG PO CAPS
200.0000 mg | ORAL_CAPSULE | Freq: Two times a day (BID) | ORAL | Status: DC
  Administered 2016-08-14 – 2016-08-16 (×4): 200 mg via ORAL
  Filled 2016-08-14 (×4): qty 1

## 2016-08-14 MED ORDER — ALUM & MAG HYDROXIDE-SIMETH 200-200-20 MG/5ML PO SUSP: 30.0000 mL | ORAL | Status: DC | PRN

## 2016-08-14 MED ORDER — DOCUSATE SODIUM 100 MG PO CAPS
100.0000 mg | ORAL_CAPSULE | Freq: Two times a day (BID) | ORAL | Status: DC
  Administered 2016-08-14 – 2016-08-16 (×4): 100 mg via ORAL
  Filled 2016-08-14 (×4): qty 1

## 2016-08-14 MED ORDER — SODIUM CHLORIDE 0.9 % IV SOLN
1000.0000 mg | INTRAVENOUS | Status: DC
  Filled 2016-08-14: qty 10

## 2016-08-14 MED ORDER — GABAPENTIN 300 MG PO CAPS: 300.0000 mg | ORAL_CAPSULE | Freq: Once | ORAL | Status: DC

## 2016-08-14 MED ORDER — SODIUM CHLORIDE 0.9 % IJ SOLN
INTRAMUSCULAR | Status: AC
  Filled 2016-08-14: qty 50

## 2016-08-14 MED ORDER — GLYCOPYRROLATE 0.2 MG/ML IJ SOLN
INTRAMUSCULAR | Status: DC | PRN
  Administered 2016-08-14: 0.2 mg via INTRAVENOUS

## 2016-08-14 MED ORDER — PROPOFOL 10 MG/ML IV BOLUS
INTRAVENOUS | Status: DC | PRN
  Administered 2016-08-14 (×4): 10 mg via INTRAVENOUS

## 2016-08-14 MED ORDER — PROPOFOL 500 MG/50ML IV EMUL
INTRAVENOUS | Status: AC
  Filled 2016-08-14: qty 50

## 2016-08-14 MED ORDER — SODIUM CHLORIDE 0.9 % IV SOLN
INTRAVENOUS | Status: DC | PRN
  Administered 2016-08-14: 15 ug/min via INTRAVENOUS

## 2016-08-14 MED ORDER — BUPIVACAINE HCL (PF) 0.25 % IJ SOLN
INTRAMUSCULAR | Status: AC
  Filled 2016-08-14: qty 30

## 2016-08-14 MED ORDER — METOCLOPRAMIDE HCL 10 MG PO TABS: 5.0000 mg | ORAL_TABLET | Freq: Three times a day (TID) | ORAL | Status: DC | PRN

## 2016-08-14 MED ORDER — BUPIVACAINE HCL (PF) 0.5 % IJ SOLN
INTRAMUSCULAR | Status: DC | PRN
  Administered 2016-08-14: 2.5 mL

## 2016-08-14 MED ORDER — EPINEPHRINE PF 1 MG/ML IJ SOLN
INTRAMUSCULAR | Status: AC
  Filled 2016-08-14: qty 1

## 2016-08-14 MED ORDER — BISACODYL 10 MG RE SUPP
10.0000 mg | Freq: Every day | RECTAL | Status: DC | PRN
  Administered 2016-08-16: 10 mg via RECTAL
  Filled 2016-08-14: qty 1

## 2016-08-14 MED ORDER — OXYCODONE HCL 5 MG PO TABS
5.0000 mg | ORAL_TABLET | Freq: Four times a day (QID) | ORAL | Status: DC | PRN
  Administered 2016-08-14 – 2016-08-15 (×2): 5 mg via ORAL
  Filled 2016-08-14 (×3): qty 1

## 2016-08-14 MED ORDER — BUPIVACAINE-EPINEPHRINE (PF) 0.25% -1:200000 IJ SOLN
INTRAMUSCULAR | Status: DC | PRN
  Administered 2016-08-14: 30 mL

## 2016-08-14 MED ORDER — ACETAMINOPHEN 500 MG PO TABS: 1000.0000 mg | ORAL_TABLET | Freq: Once | ORAL | Status: DC

## 2016-08-14 SURGICAL SUPPLY — 55 items
BAG DECANTER FOR FLEXI CONT (MISCELLANEOUS) ×3 IMPLANT
BLADE SAW 1 (BLADE) ×3 IMPLANT
CANISTER SUCT 1200ML W/VALVE (MISCELLANEOUS) ×3 IMPLANT
CANISTER SUCT 3000ML PPV (MISCELLANEOUS) ×6 IMPLANT
CAPT HIP HEMI 2 ×3 IMPLANT
CATH FOL LEG HOLDER (MISCELLANEOUS) ×3 IMPLANT
CHLORAPREP W/TINT 26ML (MISCELLANEOUS) ×3 IMPLANT
CRADLE LAMINECT ARM (MISCELLANEOUS) ×3 IMPLANT
DRAPE INCISE IOBAN 66X60 STRL (DRAPES) ×3 IMPLANT
DRAPE SHEET LG 3/4 BI-LAMINATE (DRAPES) ×3 IMPLANT
DRAPE STERI IOBAN 125X83 (DRAPES) ×3 IMPLANT
DRAPE U-SHAPE 47X51 STRL (DRAPES) ×3 IMPLANT
DRSG AQUACEL AG ADV 3.5X10 (GAUZE/BANDAGES/DRESSINGS) ×3 IMPLANT
DRSG AQUACEL AG ADV 3.5X14 (GAUZE/BANDAGES/DRESSINGS) ×3 IMPLANT
DRSG TEGADERM 4X10 (GAUZE/BANDAGES/DRESSINGS) ×3 IMPLANT
ELECT BLADE 6.5 EXT (BLADE) ×3 IMPLANT
ELECT CAUTERY BLADE 6.4 (BLADE) IMPLANT
ELECT REM PT RETURN 9FT ADLT (ELECTROSURGICAL) ×3
ELECTRODE REM PT RTRN 9FT ADLT (ELECTROSURGICAL) ×1 IMPLANT
GAUZE PACK 2X3YD (MISCELLANEOUS) ×3 IMPLANT
GAUZE PETRO XEROFOAM 1X8 (MISCELLANEOUS) ×3 IMPLANT
GAUZE SPONGE 4X4 12PLY STRL (GAUZE/BANDAGES/DRESSINGS) ×3 IMPLANT
GAUZE XEROFORM 4X4 STRL (GAUZE/BANDAGES/DRESSINGS) ×3 IMPLANT
GLOVE INDICATOR 8.0 STRL GRN (GLOVE) ×3 IMPLANT
GLOVE SURG ORTHO 8.0 STRL STRW (GLOVE) ×3 IMPLANT
GOWN STRL REUS W/ TWL LRG LVL3 (GOWN DISPOSABLE) ×1 IMPLANT
GOWN STRL REUS W/ TWL XL LVL3 (GOWN DISPOSABLE) ×1 IMPLANT
GOWN STRL REUS W/TWL LRG LVL3 (GOWN DISPOSABLE) ×2
GOWN STRL REUS W/TWL XL LVL3 (GOWN DISPOSABLE) ×2
HEMOVAC 400CC 10FR (MISCELLANEOUS) IMPLANT
HOOD W/PEELAWAY (MISCELLANEOUS) ×9 IMPLANT
IV NS 100ML SINGLE PACK (IV SOLUTION) ×3 IMPLANT
KIT RM TURNOVER STRD PROC AR (KITS) ×3 IMPLANT
NDL SAFETY 18GX1.5 (NEEDLE) ×3 IMPLANT
NEEDLE HYPO 22GX1.5 SAFETY (NEEDLE) ×3 IMPLANT
NS IRRIG 1000ML POUR BTL (IV SOLUTION) ×3 IMPLANT
PACK HIP PROSTHESIS (MISCELLANEOUS) ×3 IMPLANT
PILLOW ABDUCTION FOAM SM (MISCELLANEOUS) ×3 IMPLANT
PILLOW ABDUCTION MEDIUM (MISCELLANEOUS) ×3 IMPLANT
PRESSURIZER CEMENT PROX FEM SM (MISCELLANEOUS) ×3 IMPLANT
PRESSURIZER FEM CANAL M (MISCELLANEOUS) ×3 IMPLANT
PULSAVAC PLUS IRRIG FAN TIP (DISPOSABLE) ×3
SOL .9 NS 3000ML IRR  AL (IV SOLUTION) ×2
SOL .9 NS 3000ML IRR UROMATIC (IV SOLUTION) ×1 IMPLANT
STAPLER SKIN PROX 35W (STAPLE) ×3 IMPLANT
SUT DVC 2 QUILL PDO  T11 36X36 (SUTURE) ×2
SUT DVC 2 QUILL PDO T11 36X36 (SUTURE) ×1 IMPLANT
SUT VIC AB 0 CT1 18XCR BRD 8 (SUTURE) ×1 IMPLANT
SUT VIC AB 0 CT1 8-18 (SUTURE) ×2
SUT VIC AB 2-0 CT1 18 (SUTURE) ×3 IMPLANT
SYR 20CC LL (SYRINGE) ×3 IMPLANT
SYR 30ML LL (SYRINGE) ×3 IMPLANT
TIP BRUSH PULSAVAC PLUS 24.33 (MISCELLANEOUS) ×3 IMPLANT
TIP FAN IRRIG PULSAVAC PLUS (DISPOSABLE) ×1 IMPLANT
TOWER CARTRIDGE SMART MIX (DISPOSABLE) ×3 IMPLANT

## 2016-08-14 NOTE — Care Management (Signed)
Her son forgot to sign the consent form. Dr. Harlow Mares did talk to him this am. The pt is in pain. I see no reason to hold off surgery. We have been unable to contact the son after repeated attempts.  I am able to talk to the pt, she seems aware of the surgery, and what we are going to do. Will proceed.

## 2016-08-14 NOTE — Progress Notes (Signed)
PT Cancellation Note  Patient Details Name: Misty Lucas MRN: [id number] DOB: Oct 01, 1924   Cancelled Treatment:    Reason Eval/Treat Not Completed: Other (comment). Consult received and chart reviewed. Pt currently pending surgery this date for displaced hip fracture. Will hold and cancel current therapy order. Will need new orders post-op for therapy initiation.    Roxanne Panek 08/14/2016, 11:01 AM  Greggory Stallion, PT, DPT [phone]

## 2016-08-14 NOTE — Progress Notes (Signed)
This nurse and Osvaldo Human RN obtained telephone consent from patient's son, Bracha Frankowski, for surgical consent.

## 2016-08-14 NOTE — Op Note (Signed)
08/13/2016 - 08/14/2016  9:41 AM  PATIENT:  Misty Lucas   MRN: [id number]  PRE-OPERATIVE DIAGNOSIS:  Displaced Subcapital fracture left hip   POST-OPERATIVE DIAGNOSIS: Same  Procedure: Left Hip Anterior Hip Hemiarthroplasty   Surgeon: Elyn Aquas. Harlow Mares, MD   Anesthesia: Spinal   EBL: 100 mL   Specimens: None   Drains: None   Components used: A size 3 standard Polarstem [employer], a 43 x 28 +0 mm bipolar head    Description of the procedure in detail: After informed consent was obtained and the appropriate extremity marked in the pre-operative holding area, the patient was taken to the operating room and placed in the supine position on the fracture table. All pressure points were well padded and bilateral lower extremities were place in traction spars. The hip was prepped and draped in standard sterile fashion. A spinal anesthetic had been delivered by the anesthesia team. The skin and subcutaneous tissues were injected with a mixture of Marcaine with epinephrine for post-operative pain. A longitudinal incision approximately 10 cm in length was carried out from the anterior superior iliac spine to the greater trochanter. The tensor fascia was divided and blunt dissection was taken down to the level of the joint capsule. The lateral circumflex vessels were cauterized. Deep retractors were placed and a portion of the anterior capsule was excised. Using fluoroscopy the neck cut was planned and carried out with a sagittal saw. The head was passed from the field with use of a corkscrew and hip skid. Deep retractors were placed along the acetabulum and bony and soft tissue debris was removed.   Attention was then turned to the proximal femur. The leg was placed in extension and external rotation. The canal was opened and sequentially broached to a size 3. The trial components were placed and the hip relocated. The components were found to be in good position using fluoroscopy. The hip was  dislocated and the trial components removed. The final components were impacted in to position and the hip relocated. The final components were again check with fluoroscopy and found to be in good position. Hemostasis was achieved with electrocautery. The deep capsule was injected with Marcaine and epinephrine. The wound was irrigated with bacitracin laced normal saline and the tensor fascia closed with #2 Quill suture. The subcutaneous tissues were closed with 2-0 vicryl and staples for the skin. A sterile dressing was applied and an abduction pillow. Patient tolerated the procedure well and there were no apparent complication. Patient was taken to the recovery room in good condition.    Elyn Aquas. Harlow Mares, MD  08/14/2016 9:41 AM

## 2016-08-14 NOTE — Anesthesia Procedure Notes (Signed)
Performed by: Saivion Goettel Pre-anesthesia Checklist: Patient identified, Emergency Drugs available, Suction available, Patient being monitored and Timeout performed Oxygen Delivery Method: Simple face mask       

## 2016-08-14 NOTE — Progress Notes (Addendum)
[facility] NAME: Misty Lucas    MR#:  [id number]  DATE OF BIRTH:  05-May-1924  SUBJECTIVE:  Came in after a fall at the facility about a week ago. Son in the room. Patient sleepy and confused.  REVIEW OF SYSTEMS:   Review of Systems  Unable to perform ROS: Dementia   Tolerating Diet:npo Tolerating PT: pending  DRUG ALLERGIES:   Allergies  Allergen Reactions  . Carafate [Sucralfate] Other (See Comments)    Unknown, pt does not remember  . Lipitor [Atorvastatin] Other (See Comments)    Unknown, pt can't remember   . Mobic [Meloxicam] Other (See Comments)    Pt does not remember  . Penicillins Other (See Comments)    Pt does not remember  . Tramadol Other (See Comments)    Pt does not remember    VITALS:  Blood pressure (!) 152/58, pulse 81, temperature 98.1 F (36.7 C), temperature source Oral, resp. rate 18, height 5' (1.524 m), weight 43.5 kg (95 lb 14.4 oz), SpO2 96 %.  PHYSICAL EXAMINATION:   Physical Exam  GENERAL:  83 y.o.-year-old patient lying in the bed with no acute distress.  EYES: Pupils equal, round, reactive to light and accommodation. No scleral icterus. Extraocular muscles intact.  HEENT: Head atraumatic, normocephalic. Oropharynx and nasopharynx clear.  NECK:  Supple, no jugular venous distention. No thyroid enlargement, no tenderness.  LUNGS: Normal breath sounds bilaterally, no wheezing, rales, rhonchi. No use of accessory muscles of respiration.  CARDIOVASCULAR: S1, S2 normal. No murmurs, rubs, or gallops.  ABDOMEN: Soft, nontender, nondistended. Bowel sounds present. No organomegaly or mass.  EXTREMITIES: No cyanosis, clubbing or edema b/l.    NEUROLOGIC: Cranial nerves II through XII are intact. No focal Motor or sensory deficits b/l.   PSYCHIATRIC:  patient is alert and oriented x 3.  SKIN: No obvious rash, lesion, or ulcer.   LABORATORY PANEL:  CBC  Recent Labs Lab 08/14/16 0426   WBC 6.4  HGB 12.2  HCT 35.4  PLT 343    Chemistries   Recent Labs Lab 08/13/16 1630 08/14/16 0426  NA 136 138  K 4.2 4.0  CL 102 106  CO2 28 27  GLUCOSE 98 103*  BUN 19 14  CREATININE 0.85 0.67  CALCIUM 8.8* 8.9  AST 25  --   ALT 18  --   ALKPHOS 62  --   BILITOT 0.4  --    Cardiac Enzymes No results for input(s): TROPONINI in the last 168 hours. RADIOLOGY:  Dg Chest 1 View  Result Date: 08/13/2016 CLINICAL DATA:  Left hip fracture.  Fell 2 weeks ago. EXAM: CHEST 1 VIEW COMPARISON:  None. FINDINGS: Heart size near the upper limit of normal. Tortuous aorta. Mildly hyperexpanded lungs with mildly prominent interstitial markings. Mild to moderate levoconvex thoracolumbar scoliosis and degenerative changes. Diffuse osteopenia. IMPRESSION: No acute abnormality.  Mild changes of COPD. Electronically Signed   By: Claudie Revering M.D.   On: 08/13/2016 18:40   Dg Hip Unilat W Or Wo Pelvis 2-3 Views Left  Result Date: 08/13/2016 CLINICAL DATA:  Status post fall 2 weeks ago.  Left hip fracture. EXAM: DG HIP (WITH OR WITHOUT PELVIS) 2-3V LEFT COMPARISON:  CT left hip 06/08/2017. FINDINGS: The patient has a subcapital left hip fracture. Some fracture margins appear somewhat corticated suggesting subacute injury. The femoral head remnant is somewhat flattened and sclerotic compatible with necrosis. No other fracture is identified. The right  hip is located. IMPRESSION: Subcapital left hip fracture could be acute but has an appearance suggestive of subacute injury. Flattening and sclerosis of the left femoral head is compatible with avascular necrosis. Electronically Signed   By: Inge Rise M.D.   On: 08/13/2016 18:41   ASSESSMENT AND PLAN:  83 year old female with past medical history of osteoporosis, fibrocystic breast disease, hyperlipidemia, lumbar stenosis who presents to the hospital due to left hip pain and noted to have a left hip fracture.  1. Preoperative cardiovascular  examination-patient is a low cardiac risk for noncardiac surgery. -Patient denies any previous history of MI or any previous CVA or any vascular disease. -preoperative EKG--NSR, no acute ST-T changes - No contraindications surgery at this time--pt is at intermediate risk for surgery. D/w son in the room  2. Left hip fracture-secondary to a mechanical fall about a week or so ago. -ortho consult with Dr Harlow Mares pending  3. Osteoporosis-continue calcium with vitamin D supplements. -Continue diclofenac.   Case discussed with Care Management/Social Worker. Management plans discussed with the patient, family and they are in agreement.  CODE STATUS: FULL code per son  DVT Prophylaxis: after surgery  TOTAL TIME TAKING CARE OF THIS PATIENT: 45minutes.  >50% time spent on counselling and coordination of care  POSSIBLE D/C IN 2-3 DAYS, DEPENDING ON CLINICAL CONDITION.  Note: This dictation was prepared with Dragon dictation along with smaller phrase technology. Any transcriptional errors that result from this process are unintentional.  Isaiahs Chancy M.D on 08/14/2016 at 8:08 AM  Between 7am to 6pm - Pager - [phone]  After 6pm go to [email or web address] - password [email or web address] [facility]  Office  [phone]  CC: Primary care physician; Rusty Aus, MD

## 2016-08-14 NOTE — Consult Note (Signed)
ORTHOPAEDIC CONSULTATION  REQUESTING PHYSICIAN: Fritzi Mandes, MD  Chief Complaint: left hip pain  HPI: Misty Lucas is a 83 y.o. female who complains of left hip pain after two mechanical falls about a week ago. She presents to the ED with a displaced subacute left hip fracture. She has no other complaints. She was able to walk without assistance prior to her injury. The history is primarily obtained from her son 34.  Past Medical History:  Diagnosis Date  . Arthritis   . Blood clot embolism during pregnancy, antepartum 1957   left thigh  . Fibrocystic breast    left breast  . Gastritis   . History of arthroscopy of knee 1980's   right  . Hyperlipemia   . Lumbar stenosis    2 buldging disc  . Myocardial infarction [facility]) 2008   Past Surgical History:  Procedure Laterality Date  . ABDOMINAL HYSTERECTOMY    . APPENDECTOMY    . BREAST SURGERY    . CARPAL TUNNEL RELEASE Bilateral 2007  . GUM SURGERY  1980's  . KNEE ARTHROSCOPY Bilateral 1980's   Social History   Social History  . Marital status: Widowed    Spouse name: N/A  . Number of children: N/A  . Years of education: N/A   Social History Main Topics  . Smoking status: Former Smoker    Types: Cigarettes    Quit date: 03/26/1967  . Smokeless tobacco: None  . Alcohol use No  . Drug use: No  . Sexual activity: Not Asked   Other Topics Concern  . None   Social History Narrative  . None   Family History  Problem Relation Age of Onset  . Heart attack Mother   . Tuberculosis Father   . Dementia Father    Allergies  Allergen Reactions  . Carafate [Sucralfate] Other (See Comments)    Unknown, pt does not remember  . Lipitor [Atorvastatin] Other (See Comments)    Unknown, pt can't remember   . Mobic [Meloxicam] Other (See Comments)    Pt does not remember  . Penicillins Other (See Comments)    Pt does not remember  . Tramadol Other (See Comments)    Pt does not remember   Prior to Admission  medications   Medication Sig Start Date End Date Taking? Authorizing Provider  acetaminophen (TYLENOL) 500 MG tablet Take 1,000 mg by mouth 3 (three) times daily.    Yes [provider]  Ascorbic Acid (VITAMIN C WITH ROSE HIPS) 500 MG tablet Take 500 mg by mouth every morning.   Yes [provider]  Calcium Carb-Cholecalciferol (OS-CAL CALCIUM + D3) 500-200 MG-UNIT TABS Take 1 tablet by mouth 2 (two) times daily.    Yes [provider]  diclofenac (VOLTAREN) 50 MG EC tablet Take 50 mg by mouth 2 (two) times daily.   Yes [provider]  docusate sodium (COLACE) 100 MG capsule Take 200 mg by mouth at bedtime.    Yes [provider]  Multiple Vitamins-Minerals (CENTRUM SILVER ULTRA WOMENS PO) Take 1 tablet by mouth every morning.   Yes [provider]  senna (SENOKOT) 8.6 MG TABS tablet Take 1 tablet by mouth daily as needed for mild constipation.   Yes [provider]   Dg Chest 1 View  Result Date: 08/13/2016 CLINICAL DATA:  Left hip fracture.  Fell 2 weeks ago. EXAM: CHEST 1 VIEW COMPARISON:  None. FINDINGS: Heart size near the upper limit of normal. Tortuous aorta. Mildly hyperexpanded  lungs with mildly prominent interstitial markings. Mild to moderate levoconvex thoracolumbar scoliosis and degenerative changes. Diffuse osteopenia. IMPRESSION: No acute abnormality.  Mild changes of COPD. Electronically Signed   By: Claudie Revering M.D.   On: 08/13/2016 18:40   Dg Hip Unilat W Or Wo Pelvis 2-3 Views Left  Result Date: 08/13/2016 CLINICAL DATA:  Status post fall 2 weeks ago.  Left hip fracture. EXAM: DG HIP (WITH OR WITHOUT PELVIS) 2-3V LEFT COMPARISON:  CT left hip 06/08/2017. FINDINGS: The patient has a subcapital left hip fracture. Some fracture margins appear somewhat corticated suggesting subacute injury. The femoral head remnant is somewhat flattened and sclerotic compatible with necrosis. No other fracture is identified. The right hip  is located. IMPRESSION: Subcapital left hip fracture could be acute but has an appearance suggestive of subacute injury. Flattening and sclerosis of the left femoral head is compatible with avascular necrosis. Electronically Signed   By: Inge Rise M.D.   On: 08/13/2016 18:41    Positive ROS: All other systems have been reviewed and were otherwise negative with the exception of those mentioned in the HPI and as above.  Physical Exam: General: Alert, no acute distress Cardiovascular: No pedal edema Respiratory: No cyanosis, no use of accessory musculature GI: No organomegaly, abdomen is soft and non-tender Skin: No lesions in the area of chief complaint, otherwise multiple skin lesions throughout bilateral lower extremities Neurologic: Sensation intact distally Psychiatric: Patient appears competent for consent with normal mood and affect Lymphatic: No axillary or cervical lymphadenopathy  MUSCULOSKELETAL: left hip short and externally rotated, +DF/PF/EHL, good cap refill RLE has a FROM and is non-tender, NVI BUE non-tender, NVI  Assessment: Displaced subcapital hip fracture and AVN of the femoral head  Plan: The diagnosis, risks, benefits and alternatives to treatment are all discussed in detail with the patient and family. Risks include but are not limited to bleeding, infection, DVT, PE, nerve or vascular injury, non-union, repeat operation, persistent pain, weakness, stiffness and death. She understands and is eager to proceed.  Plan an anterior approach left hip hemiarthroplasty.     Lovell Sheehan, MD    08/14/2016 8:52 AM

## 2016-08-14 NOTE — Progress Notes (Signed)
Attempted to call son Misty Lucas for consent, no answer.

## 2016-08-14 NOTE — Anesthesia Post-op Follow-up Note (Cosign Needed)
Anesthesia QCDR form completed.        

## 2016-08-14 NOTE — Anesthesia Procedure Notes (Signed)
Spinal  Patient location during procedure: OR Staffing Anesthesiologist: Talar Fraley Performed: anesthesiologist  Preanesthetic Checklist Completed: patient identified, site marked, surgical consent, pre-op evaluation, timeout performed, IV checked and risks and benefits discussed Spinal Block Patient position: sitting Prep: Betadine Patient monitoring: heart rate, cardiac monitor, continuous pulse ox and blood pressure Approach: midline Location: L3-4 Injection technique: single-shot Needle Needle type: Pencil-Tip  Needle gauge: 25 G Needle length: 9 cm Assessment Sensory level: T10     

## 2016-08-14 NOTE — Clinical Social Work Note (Signed)
CSW will follow for SNF placement pending PT recommendation. The CSW will assess when able as the patient has admitted from [facility].  Santiago Bumpers, MSW, Latanya Presser [phone]

## 2016-08-14 NOTE — Progress Notes (Signed)
Pt is alert to self. SCD's/TEDS  removed because pt could not tolerate them. Pt has external catheter. NPO since midnight for possible surgery today. Denies pain.

## 2016-08-14 NOTE — Anesthesia Preprocedure Evaluation (Signed)
Anesthesia Evaluation  Patient identified by MRN, date of birth, ID band Patient awake    Reviewed: Allergy & Precautions, NPO status , Patient's Chart, lab work & pertinent test results, reviewed documented beta blocker date and time   Airway Mallampati: II  TM Distance: >3 FB     Dental  (+) Chipped   Pulmonary former smoker,           Cardiovascular + Past MI       Neuro/Psych    GI/Hepatic   Endo/Other    Renal/GU      Musculoskeletal  (+) Arthritis ,   Abdominal   Peds  Hematology   Anesthesia Other Findings   Reproductive/Obstetrics                             Anesthesia Physical Anesthesia Plan  ASA: III  Anesthesia Plan: Spinal   Post-op Pain Management:    Induction:   PONV Risk Score and Plan:   Airway Management Planned:   Additional Equipment:   Intra-op Plan:   Post-operative Plan:   Informed Consent: I have reviewed the patients History and Physical, chart, labs and discussed the procedure including the risks, benefits and alternatives for the proposed anesthesia with the patient or authorized representative who has indicated his/her understanding and acceptance.     Plan Discussed with: CRNA  Anesthesia Plan Comments:         Anesthesia Quick Evaluation

## 2016-08-14 NOTE — Anesthesia Preprocedure Evaluation (Signed)
Anesthesia Evaluation  Patient identified by MRN, date of birth, ID band Patient awake    Reviewed: Allergy & Precautions, NPO status , Patient's Chart, lab work & pertinent test results, reviewed documented beta blocker date and time   Airway Mallampati: II  TM Distance: >3 FB     Dental  (+) Chipped   Pulmonary former smoker,           Cardiovascular + Past MI       Neuro/Psych    GI/Hepatic   Endo/Other    Renal/GU      Musculoskeletal  (+) Arthritis ,   Abdominal   Peds  Hematology   Anesthesia Other Findings Pt knows what the procedure is going to be.  Son forgot to sign connsent. Dr. Harlow Mares saw the son this am. The pt is in pain. Cannot get in touch with the son. I don't see any reason to postpone the case. Will proceed.  Reproductive/Obstetrics                             Anesthesia Physical Anesthesia Plan  ASA: III  Anesthesia Plan: Spinal   Post-op Pain Management:    Induction:   PONV Risk Score and Plan:   Airway Management Planned:   Additional Equipment:   Intra-op Plan:   Post-operative Plan:   Informed Consent: I have reviewed the patients History and Physical, chart, labs and discussed the procedure including the risks, benefits and alternatives for the proposed anesthesia with the patient or authorized representative who has indicated his/her understanding and acceptance.     Plan Discussed with: CRNA  Anesthesia Plan Comments:         Anesthesia Quick Evaluation

## 2016-08-14 NOTE — Progress Notes (Signed)
Patient arrived from PACU. Dressing dry and intact. External catheter placed. Son at bedside. VSS. No complaints of pain. Continue to monitor.

## 2016-08-14 NOTE — Transfer of Care (Signed)
Immediate Anesthesia Transfer of Care Note  Patient: Misty Lucas  Procedure(s) Performed: Procedure(s): ARTHROPLASTY BIPOLAR HIP (HEMIARTHROPLASTY) (Left)  Patient Location: PACU  Anesthesia Type:Spinal  Level of Consciousness: awake, alert  and oriented  Airway & Oxygen Therapy: Patient Spontanous Breathing and Patient connected to face mask oxygen  Post-op Assessment: Report given to RN and Post -op Vital signs reviewed and stable  Post vital signs: Reviewed and stable  Last Vitals:  Vitals:   08/13/16 2036 08/14/16 0736  BP: (!) 172/67 (!) 152/58  Pulse: 87 81  Resp: 17 18  Temp: 36.6 C 36.7 C    Last Pain:  Vitals:   08/14/16 0736  TempSrc: Oral  PainSc: 3          Complications: No apparent anesthesia complications

## 2016-08-15 LAB — BASIC METABOLIC PANEL
ANION GAP: 7 (ref 5–15)
BUN: 12 mg/dL (ref 6–20)
CO2: 27 mmol/L (ref 22–32)
Calcium: 8.8 mg/dL — ABNORMAL LOW (ref 8.9–10.3)
Chloride: 104 mmol/L (ref 101–111)
Creatinine, Ser: 0.88 mg/dL (ref 0.44–1.00)
GFR calc Af Amer: >60 mL/min (ref >60)
GFR, EST NON AFRICAN AMERICAN: 56 mL/min — AB (ref >60)
GLUCOSE: 142 mg/dL — AB (ref 65–99)
POTASSIUM: 3.6 mmol/L (ref 3.5–5.1)
Sodium: 138 mmol/L (ref 135–145)

## 2016-08-15 LAB — CBC
HEMATOCRIT: 35 % (ref 35.0–47.0)
Hemoglobin: 12.1 g/dL (ref 12.0–16.0)
MCH: 31.9 pg (ref 26.0–34.0)
MCHC: 34.7 g/dL (ref 32.0–36.0)
MCV: 92.1 fL (ref 80.0–100.0)
PLATELETS: 372 10*3/uL (ref 150–440)
RBC: 3.8 MIL/uL (ref 3.80–5.20)
RDW: 13.4 % (ref 11.5–14.5)
WBC: 9.5 10*3/uL (ref 3.6–11.0)

## 2016-08-15 MED ORDER — METOPROLOL TARTRATE 25 MG PO TABS
25.0000 mg | ORAL_TABLET | Freq: Two times a day (BID) | ORAL | Status: DC
  Administered 2016-08-15 – 2016-08-16 (×2): 25 mg via ORAL
  Filled 2016-08-15 (×2): qty 1

## 2016-08-15 MED ORDER — HALOPERIDOL LACTATE 5 MG/ML IJ SOLN
2.0000 mg | Freq: Three times a day (TID) | INTRAMUSCULAR | Status: DC | PRN
  Administered 2016-08-15: 2 mg via INTRAVENOUS
  Filled 2016-08-15: qty 1

## 2016-08-15 NOTE — Evaluation (Signed)
Physical Therapy Evaluation Patient Details Name: Misty Lucas MRN: [id number] DOB: [birth year] Today's Date: 08/15/2016   History of Present Illness  Pt is a 83 yo F, admitted to acute care on 8/3 for closed L hip fx, which led to ant hip hemiarthroplasty on 8/4. Pt is currently post-op day 1. Prior to admission, pt was resident at [facility] PMH: arthritis, blood clot embolism during pregnancy, fibrocystic breast, gastritis, hx of R knee arthroscopy, HLD, lumbar stenosis, MI and OP.   Clinical Impression  Pt is pleasantly confused and willing to participate in therapy. Pt performs bed mobility with Min-ModA, tranfers with Max-ModA +2, and ambulation with MinA +2, due to impaired strength, endurance, balance, pain, confusion, and pt fear of standing and amb. Pt able to amb total of 5 ft with B UE support from RW. Overall, pt responded well to today's treatment with no adverse affects. Pt would benefit from skilled PT to address the previously mentioned impairments and promote return to PLOF. Currently recommending SNF, pending d/c.    Follow Up Recommendations SNF    Equipment Recommendations  None recommended by PT    Recommendations for Other Services       Precautions / Restrictions Precautions Precautions: Fall Restrictions Weight Bearing Restrictions: Yes LLE Weight Bearing: Weight bearing as tolerated      Mobility  Bed Mobility Overal bed mobility: Needs Assistance Bed Mobility: Supine to Sit     Supine to sit: Mod assist;Min assist     General bed mobility comments: Pt min-mod assist, requiring ModA to scoot to EOB , and max verbal/visual/tactile cues to perform sequencing to transition from supine ot sit.   Transfers Overall transfer level: Needs assistance Equipment used: Rolling walker (2 wheeled) Transfers: Sit to/from Stand Sit to Stand: Max assist;+2 physical assistance;Mod assist         General transfer comment: Pt Max +2 with initial STS transfer,  struggling with initiation of task and becoming highly scared and stopping transfer when half way standing. Max cues for mechanics (cuing pt to come to full erect standing) and safety. With multiple repititions of standing (x3) pt proressed to [place] +2, demonstrating improved carryover of sequencing.   Ambulation/Gait Ambulation/Gait assistance: Min assist;+2 physical assistance Ambulation Distance (Feet): 5 Feet Assistive device: Rolling walker (2 wheeled) Gait Pattern/deviations: Shuffle Gait velocity: decreased    General Gait Details: Pt MinA +2 and requires mod cues to amb from bed to chair, then to bedside commode. Pt required minA with navigating RW, and visual cues to sit in chair, as pt was highly confused.   Stairs            Wheelchair Mobility    Modified Rankin (Stroke Patients Only)       Balance Overall balance assessment: Needs assistance Sitting-balance support: Bilateral upper extremity supported;Feet supported Sitting balance-Leahy Scale: Good Sitting balance - Comments: Supervision with sitting balance, as pt is highly impulsive. Pt with slight increase in postural sway, but is able to self correct.    Standing balance support: Bilateral upper extremity supported Standing balance-Leahy Scale: Fair Standing balance comment: Pt with fair static standing balance, requiring B UE support from RW. Pt with fear of and tendency to lose balance posteriorly, minA and cues to stang with erect posture.                             Pertinent Vitals/Pain Pain Assessment: Faces Faces Pain Scale: Hurts little more  Pain Location: L hip  Pain Descriptors / Indicators: Operative site guarding;Discomfort Pain Intervention(s): Limited activity within patient's tolerance;Monitored during session;Premedicated before session    Home Living Family/patient expects to be discharged to:: Assisted living                 Additional Comments: Pt is a poor  historian, due to high confusion     Prior Function Level of Independence: Needs assistance         Comments: Pt living in ALF at [facility], prior to admission. Pt is highly confused and a poor historian. Unclear how much asssitance or AD pt needing prior to admission.      Hand Dominance        Extremity/Trunk Assessment   Upper Extremity Assessment Upper Extremity Assessment: Overall WFL for tasks assessed    Lower Extremity Assessment Lower Extremity Assessment: Generalized weakness (MMT to pt B LE's grossly 4-/5 )       Communication   Communication: No difficulties  Cognition Arousal/Alertness: Awake/alert Behavior During Therapy: Impulsive Overall Cognitive Status: No family/caregiver present to determine baseline cognitive functioning                                 General Comments: Pt highly confused and concerned, thinking her husband may be in a car accident since he is not at the hospital. Pt oriented to person and place, but not to the situation, thinking her surgery was cancelled yesterday. Pt does not recall living in assisted living, and was unable to provide an accurate history.       General Comments      Exercises Other Exercises Other Exercises: Supine therex performed to B LE's with supervision x 10 reps: ankle pumps, quad sets, glute sets, SLR (minA for L LE), and hip abd. Pt follows multistep commands well. Able to maintain repetitive task when SPT counts reps allowed.  Other Exercises: Toileting: Pt ModA +2 for transfer, with maxA for clothing management.    Assessment/Plan    PT Assessment Patient needs continued PT services  PT Problem List Decreased strength;Decreased activity tolerance;Decreased balance;Decreased mobility;Decreased coordination;Decreased cognition;Decreased safety awareness;Pain       PT Treatment Interventions Gait training;DME instruction;Functional mobility training;Therapeutic activities;Therapeutic  exercise;Balance training;Neuromuscular re-education;Patient/family education;Manual techniques    PT Goals (Current goals can be found in the Care Plan section)  Acute Rehab PT Goals Patient Stated Goal: to get stronger PT Goal Formulation: With patient Time For Goal Achievement: 08/29/16 Potential to Achieve Goals: Good Additional Goals Additional Goal #1: Pt to perform STS x5 with min A  for greater strength through B LE's and return to PLOF.     Frequency BID   Barriers to discharge        Co-evaluation               AM-PAC PT "6 Clicks" Daily Activity  Outcome Measure Difficulty turning over in bed (including adjusting bedclothes, sheets and blankets)?: Total Difficulty moving from lying on back to sitting on the side of the bed? : Total Difficulty sitting down on and standing up from a chair with arms (e.g., wheelchair, bedside commode, etc,.)?: Total Help needed moving to and from a bed to chair (including a wheelchair)?: A Lot Help needed walking in hospital room?: A Lot Help needed climbing 3-5 steps with a railing? : Total 6 Click Score: 8    End of Session Equipment Utilized During Treatment:  Gait belt Activity Tolerance: Patient tolerated treatment well Patient left: Other (comment) (bedside commode with CNA and family in room) Nurse Communication: Mobility status PT Visit Diagnosis: Unsteadiness on feet (R26.81);Other abnormalities of gait and mobility (R26.89);Muscle weakness (generalized) (M62.81);Pain Pain - Right/Left: Left Pain - part of body: Hip    Time: 0852-0929 PT Time Calculation (min) (ACUTE ONLY): 37 min   Charges:         PT G Codes:        Oran Rein PT, SPT  Bevelyn Ngo 08/15/2016, 3:56 PM

## 2016-08-15 NOTE — Anesthesia Postprocedure Evaluation (Signed)
Anesthesia Post Note  Patient: Misty Lucas  Procedure(s) Performed: Procedure(s) (LRB): ARTHROPLASTY BIPOLAR HIP (HEMIARTHROPLASTY) (Left)  Patient location during evaluation: PACU Anesthesia Type: Spinal Level of consciousness: oriented and awake and alert Pain management: pain level controlled Vital Signs Assessment: post-procedure vital signs reviewed and stable Respiratory status: spontaneous breathing, respiratory function stable and patient connected to nasal cannula oxygen Cardiovascular status: blood pressure returned to baseline and stable Postop Assessment: no headache and no backache Anesthetic complications: no     Last Vitals:  Vitals:   08/15/16 0730 08/15/16 0731  BP: (!) 186/59 (!) 176/61  Pulse: (!) 113 (!) 107  Resp: 18   Temp: 37.5 C     Last Pain:  Vitals:   08/15/16 0730  TempSrc: Oral  PainSc:                  Jayanth Szczesniak S

## 2016-08-15 NOTE — Progress Notes (Signed)
Patient restless and pulling off her external cath x 3, pulling clothes off, very confused. Yelling out. Reoriented several times to being in the hospital. Will continue to monitor

## 2016-08-15 NOTE — Progress Notes (Signed)
Patient very restless, agittated. MD paged, orders for haldol and bp medication.

## 2016-08-15 NOTE — Progress Notes (Signed)
Patient very confused, thinks she is at home and wants to get in contact with her husband. Attempted to reorient patient, no success. Patient restless in bed this am. Bed alarm placed. Continue to monitor patient.

## 2016-08-15 NOTE — Progress Notes (Signed)
Subjective:  Patient reports pain as mild.  She is confused. Her friend is in the room.  Objective:   VITALS:   Vitals:   08/14/16 2159 08/15/16 0339 08/15/16 0730 08/15/16 0731  BP: (!) 149/71 (!) 156/61 (!) 186/59 (!) 176/61  Pulse: 88 (!) 108 (!) 113 (!) 107  Resp: 18 19 18    Temp: 97.6 F (36.4 C) 99.7 F (37.6 C) 99.5 F (37.5 C)   TempSrc:  Oral Oral   SpO2: 95% 100% 95% 96%  Weight:      Height:        PHYSICAL EXAM:  Sensation intact distally Dorsiflexion/Plantar flexion intact Incision: dressing C/D/I Compartment soft  LABS  Results for orders placed or performed during the hospital encounter of 08/13/16 (from the past 24 hour(s))  CBC     Status: None   Collection Time: 08/15/16  3:50 AM  Result Value Ref Range   WBC 9.5 3.6 - 11.0 K/uL   RBC 3.80 3.80 - 5.20 MIL/uL   Hemoglobin 12.1 12.0 - 16.0 g/dL   HCT 35.0 35.0 - 47.0 %   MCV 92.1 80.0 - 100.0 fL   MCH 31.9 26.0 - 34.0 pg   MCHC 34.7 32.0 - 36.0 g/dL   RDW 13.4 11.5 - 14.5 %   Platelets 372 150 - 440 K/uL  Basic metabolic panel     Status: Abnormal   Collection Time: 08/15/16  3:50 AM  Result Value Ref Range   Sodium 138 135 - 145 mmol/L   Potassium 3.6 3.5 - 5.1 mmol/L   Chloride 104 101 - 111 mmol/L   CO2 27 22 - 32 mmol/L   Glucose, Bld 142 (H) 65 - 99 mg/dL   BUN 12 6 - 20 mg/dL   Creatinine, Ser 0.88 0.44 - 1.00 mg/dL   Calcium 8.8 (L) 8.9 - 10.3 mg/dL   GFR calc non Af Amer 56 (L) >60 mL/min   GFR calc Af Amer >60 >60 mL/min   Anion gap 7 5 - 15    Dg Chest 1 View  Result Date: 08/13/2016 CLINICAL DATA:  Left hip fracture.  Fell 2 weeks ago. EXAM: CHEST 1 VIEW COMPARISON:  None. FINDINGS: Heart size near the upper limit of normal. Tortuous aorta. Mildly hyperexpanded lungs with mildly prominent interstitial markings. Mild to moderate levoconvex thoracolumbar scoliosis and degenerative changes. Diffuse osteopenia. IMPRESSION: No acute abnormality.  Mild changes of COPD. Electronically  Signed   By: Claudie Revering M.D.   On: 08/13/2016 18:40   Dg Pelvis Portable  Result Date: 08/14/2016 CLINICAL DATA:  Post-op left anterior hip replacement EXAM: PORTABLE PELVIS 1-2 VIEWS COMPARISON:  06/24/2016, 06/08/2016 FINDINGS: Patient has undergone left hip hemiarthroplasty. The femoral head component appears well seated in the acetabulum on the frontal views performed. Surgical clips overlie the left hip. Postoperative gas is present. Degenerative changes are noted in the right hip. Degenerative changes are seen in the spine. IMPRESSION: Postoperative appearance of left hip hemiarthroplasty. No adverse features. Electronically Signed   By: Nolon Nations M.D.   On: 08/14/2016 14:35   Dg Hip Operative Unilat W Or W/o Pelvis Left  Result Date: 08/14/2016 CLINICAL DATA:  Intra-op left anterior hip replacement, fluoro time 12 sec EXAM: OPERATIVE LEFT HIP (WITH PELVIS IF PERFORMED)  VIEWS TECHNIQUE: Fluoroscopic spot image(s) were submitted for interpretation post-operatively. COMPARISON:  None. FINDINGS: Four intraoperative fluoroscopic spot images are provided showing placement of left hip arthroplasty hardware. Final images show the hardware to be appropriately  positioned. Adjacent osseous structures appear anatomic in configuration. Fluoroscopy provided for 12 seconds. IMPRESSION: Intraoperative fluoroscopic spot images demonstrating left hip arthroplasty. Hardware appears appropriately positioned. No evidence of surgical complicating feature. Electronically Signed   By: Franki Cabot M.D.   On: 08/14/2016 13:34   Dg Hip Unilat W Or Wo Pelvis 2-3 Views Left  Result Date: 08/13/2016 CLINICAL DATA:  Status post fall 2 weeks ago.  Left hip fracture. EXAM: DG HIP (WITH OR WITHOUT PELVIS) 2-3V LEFT COMPARISON:  CT left hip 06/08/2017. FINDINGS: The patient has a subcapital left hip fracture. Some fracture margins appear somewhat corticated suggesting subacute injury. The femoral head remnant is somewhat  flattened and sclerotic compatible with necrosis. No other fracture is identified. The right hip is located. IMPRESSION: Subcapital left hip fracture could be acute but has an appearance suggestive of subacute injury. Flattening and sclerosis of the left femoral head is compatible with avascular necrosis. Electronically Signed   By: Inge Rise M.D.   On: 08/13/2016 18:41    Assessment/Plan: 1 Day Post-Op   Active Problems:   Closed left hip fracture (HCC)   [facility] diet Up with therapy D/C IV fluids Discharge to SNF when medically stable. She is cleared from an orthopedic standpoint.   Lovell Sheehan , MD 08/15/2016, 9:45 AM

## 2016-08-15 NOTE — Progress Notes (Signed)
[facility] NAME: Misty Lucas    MR#:  [id number]  DATE OF BIRTH:  1924/05/13  SUBJECTIVE:  Came in after a fall at the facility about a week ago. POD #1 IN the chair. Very confused. Appears calm and pleasant.  REVIEW OF SYSTEMS:   Review of Systems  Unable to perform ROS: Dementia   Tolerating Diet:npo Tolerating PT: pending  DRUG ALLERGIES:   Allergies  Allergen Reactions  . Carafate [Sucralfate] Other (See Comments)    Unknown, pt does not remember  . Lipitor [Atorvastatin] Other (See Comments)    Unknown, pt can't remember   . Mobic [Meloxicam] Other (See Comments)    Pt does not remember  . Penicillins Other (See Comments)    Pt does not remember  . Tramadol Other (See Comments)    Pt does not remember    VITALS:  Blood pressure (!) 176/61, pulse (!) 107, temperature 99.5 F (37.5 C), temperature source Oral, resp. rate 18, height 5' (1.524 m), weight 43.5 kg (95 lb 14.4 oz), SpO2 96 %.  PHYSICAL EXAMINATION:   Physical Exam  GENERAL:  83 y.o.-year-old patient lying in the bed with no acute distress.  EYES: Pupils equal, round, reactive to light and accommodation. No scleral icterus. Extraocular muscles intact.  HEENT: Head atraumatic, normocephalic. Oropharynx and nasopharynx clear.  NECK:  Supple, no jugular venous distention. No thyroid enlargement, no tenderness.  LUNGS: Normal breath sounds bilaterally, no wheezing, rales, rhonchi. No use of accessory muscles of respiration.  CARDIOVASCULAR: S1, S2 normal. No murmurs, rubs, or gallops.  ABDOMEN: Soft, nontender, nondistended. Bowel sounds present. No organomegaly or mass.  EXTREMITIES: No cyanosis, clubbing or edema b/l.   Left hip surgical dressing intact NEUROLOGIC: Cranial nerves II through XII are intact. No focal Motor or sensory deficits b/l.   PSYCHIATRIC:  patient is alert and awake. Confused SKIN: No obvious rash, lesion, or ulcer.    LABORATORY PANEL:  CBC  Recent Labs Lab 08/15/16 0350  WBC 9.5  HGB 12.1  HCT 35.0  PLT 372    Chemistries   Recent Labs Lab 08/13/16 1630  08/15/16 0350  NA 136  < > 138  K 4.2  < > 3.6  CL 102  < > 104  CO2 28  < > 27  GLUCOSE 98  < > 142*  BUN 19  < > 12  CREATININE 0.85  < > 0.88  CALCIUM 8.8*  < > 8.8*  AST 25  --   --   ALT 18  --   --   ALKPHOS 62  --   --   BILITOT 0.4  --   --   < > = values in this interval not displayed. Cardiac Enzymes No results for input(s): TROPONINI in the last 168 hours. RADIOLOGY:  Dg Chest 1 View  Result Date: 08/13/2016 CLINICAL DATA:  Left hip fracture.  Fell 2 weeks ago. EXAM: CHEST 1 VIEW COMPARISON:  None. FINDINGS: Heart size near the upper limit of normal. Tortuous aorta. Mildly hyperexpanded lungs with mildly prominent interstitial markings. Mild to moderate levoconvex thoracolumbar scoliosis and degenerative changes. Diffuse osteopenia. IMPRESSION: No acute abnormality.  Mild changes of COPD. Electronically Signed   By: Claudie Revering M.D.   On: 08/13/2016 18:40   Dg Pelvis Portable  Result Date: 08/14/2016 CLINICAL DATA:  Post-op left anterior hip replacement EXAM: PORTABLE PELVIS 1-2 VIEWS COMPARISON:  06/24/2016, 06/08/2016 FINDINGS: Patient has undergone  left hip hemiarthroplasty. The femoral head component appears well seated in the acetabulum on the frontal views performed. Surgical clips overlie the left hip. Postoperative gas is present. Degenerative changes are noted in the right hip. Degenerative changes are seen in the spine. IMPRESSION: Postoperative appearance of left hip hemiarthroplasty. No adverse features. Electronically Signed   By: Nolon Nations M.D.   On: 08/14/2016 14:35   Dg Hip Operative Unilat W Or W/o Pelvis Left  Result Date: 08/14/2016 CLINICAL DATA:  Intra-op left anterior hip replacement, fluoro time 12 sec EXAM: OPERATIVE LEFT HIP (WITH PELVIS IF PERFORMED)  VIEWS TECHNIQUE: Fluoroscopic spot  image(s) were submitted for interpretation post-operatively. COMPARISON:  None. FINDINGS: Four intraoperative fluoroscopic spot images are provided showing placement of left hip arthroplasty hardware. Final images show the hardware to be appropriately positioned. Adjacent osseous structures appear anatomic in configuration. Fluoroscopy provided for 12 seconds. IMPRESSION: Intraoperative fluoroscopic spot images demonstrating left hip arthroplasty. Hardware appears appropriately positioned. No evidence of surgical complicating feature. Electronically Signed   By: Franki Cabot M.D.   On: 08/14/2016 13:34   Dg Hip Unilat W Or Wo Pelvis 2-3 Views Left  Result Date: 08/13/2016 CLINICAL DATA:  Status post fall 2 weeks ago.  Left hip fracture. EXAM: DG HIP (WITH OR WITHOUT PELVIS) 2-3V LEFT COMPARISON:  CT left hip 06/08/2017. FINDINGS: The patient has a subcapital left hip fracture. Some fracture margins appear somewhat corticated suggesting subacute injury. The femoral head remnant is somewhat flattened and sclerotic compatible with necrosis. No other fracture is identified. The right hip is located. IMPRESSION: Subcapital left hip fracture could be acute but has an appearance suggestive of subacute injury. Flattening and sclerosis of the left femoral head is compatible with avascular necrosis. Electronically Signed   By: Inge Rise M.D.   On: 08/13/2016 18:41   ASSESSMENT AND PLAN:  83 year old female with past medical history of osteoporosis, fibrocystic breast disease, hyperlipidemia, lumbar stenosis who presents to the hospital due to left hip pain and noted to have a left hip fracture.  1. Preoperative cardiovascular examination-patient is a low cardiac risk for noncardiac surgery. -Patient denies any previous history of MI or any previous CVA or any vascular disease. -preoperative EKG--NSR, no acute ST-T changes -Patient tolerated surgery well.  2. Left hip fracture-secondary to a mechanical  fall about a week or so ago. -ortho consult with Dr Harlow Mares appreciated -POD#1  3. Osteoporosis-continue calcium with vitamin D supplements. -Continue diclofenac.  4. Dementia at baseline Pt is calm and pleasant  Management for discharge planning  Case discussed with Care Management/Social Worker. Management plans discussed with the patient, family and they are in agreement.  CODE STATUS: FULL code per son  DVT Prophylaxis: Lovenox   TOTAL TIME TAKING CARE OF THIS PATIENT: 44minutes.  >50% time spent on counselling and coordination of care  POSSIBLE D/C IN 1-2 DAYS, DEPENDING ON CLINICAL CONDITION.  Note: This dictation was prepared with Dragon dictation along with smaller phrase technology. Any transcriptional errors that result from this process are unintentional.  Lular Letson M.D on 08/15/2016 at 12:01 PM  Between 7am to 6pm - Pager - [phone]  After 6pm go to [email or web address] - password [email or web address] [facility]  Office  [phone]  CC: Primary care physician; Rusty Aus, MD

## 2016-08-15 NOTE — Clinical Social Work Note (Signed)
Clinical Social Work Assessment  Patient Details  Name: Misty Lucas MRN: [id number] Date of Birth: 07-28-1924  Date of referral:  08/15/16               Reason for consult:  Facility Placement                Permission sought to share information with:  Facility Art therapist granted to share information::  Yes, Verbal Permission Granted  Name::        Agency::     Relationship::     Contact Information:     Housing/Transportation Living arrangements for the past 2 months:  [place] of Information:  Adult Children Patient Interpreter Needed:  None Criminal Activity/Legal Involvement Pertinent to Current Situation/Hospitalization:  No - Comment as needed Significant Relationships:  Adult Children, Community Support Lives with:  Facility Resident Do you feel safe going back to the place where you live?  Yes Need for family participation in patient care:  Yes (Comment) (Patient has dementia and currently has post-op delirium)  Care giving concerns:  Patient admitted from [facility] ALF/Probably PT recommendation for STR   Social Worker assessment / plan:  CSW attempted to meet with patient and family at bedside. The patient was confused and seemed to have s/s of post-operative delirium in addition to baseline dementia. No family was at bedside. The CSW contacted the patient's daughter-in-law [name] as the patient's son [name] was not available. [name] gave verbal permission to conduct a SNF referral.   At baseline, the patient is oriented to self and place. The plan is for the patient to discharge by post-op day 3 to SNF pending PT recommendation. Should PT recommend HHPT, the patient can discharge back to her ALF.  Employment status:  Retired Nurse, adult PT Recommendations:  Not assessed at this time Information / Referral to community resources:  [place]  Patient/Family's Response to care:  The family thanked the CSW for assistance.  Patient/Family's Understanding of and Emotional Response to Diagnosis, Current Treatment, and Prognosis:  The patient is currently confused and does not fully understand the situation. The patient's family are aware of possible STR needs and are in agreement.  Emotional Assessment Appearance:  Appears stated age Attitude/Demeanor/Rapport:  Other (Confused and oriented only to self) Affect (typically observed):  Restless Orientation:  Oriented to Self Alcohol / Substance use:  Never Used Psych involvement (Current and /or in the community):  No (Comment)  Discharge Needs  Concerns to be addressed:  Care Coordination, Discharge Planning Concerns Readmission within the last 30 days:  No Current discharge risk:  Cognitively Impaired Barriers to Discharge:  Continued Medical Work up   [employer], LCSW 08/15/2016, 3:03 PM

## 2016-08-15 NOTE — Progress Notes (Signed)
Patient restless and confused. Attempted to reorient, no success. Patient in bed with alarm on. Abduction pillow in place. Dressing dry and intact on left hip. No needs at this time.

## 2016-08-15 NOTE — NC FL2 (Signed)
[place] LEVEL OF CARE SCREENING TOOL     IDENTIFICATION  Patient Name: Misty Lucas Birthdate: July 09, 1924 Sex: female Admission Date (Current Location): 08/13/2016  [place] and [place] Number:  Engineering geologist and Address:  [facility], [address], [place], [place] [address]      Provider Number: [id number]  Attending Physician Name and Address:  Fritzi Mandes, MD  Relative Name and Phone Number:       Current Level of Care: Hospital Recommended Level of Care: [place] Prior Approval Number:    Date Approved/Denied:   PASRR Number: [id number]  Discharge Plan: SNF    Current Diagnoses: Patient Active Problem List   Diagnosis Date Noted  . Closed left hip fracture ([place]) 08/13/2016    Orientation RESPIRATION BLADDER Height & Weight     Self, Time, Situation, Place  Normal Continent Weight: 95 lb 14.4 oz (43.5 kg) Height:  5' (152.4 cm)  BEHAVIORAL SYMPTOMS/MOOD NEUROLOGICAL BOWEL NUTRITION STATUS      Continent Diet (Heart healthy)  AMBULATORY STATUS COMMUNICATION OF NEEDS Skin   Extensive Assist Verbally Surgical wounds                       Personal Care Assistance Level of Assistance  Bathing, Feeding, Dressing Bathing Assistance: Maximum assistance Feeding assistance: Independent Dressing Assistance: Maximum assistance     Functional Limitations Info             SPECIAL CARE FACTORS FREQUENCY  PT (By licensed PT)     PT Frequency: Up to 5X per day, 5 days per week              Contractures Contractures Info: Present    Additional Factors Info  Code Status, Allergies Code Status Info: Full Allergies Info: Carafate Sucralfate, Lipitor Atorvastatin, Mobic Meloxicam, Penicillins, Tramadol           Current Medications (08/15/2016):  This is the current hospital active medication list Current Facility-Administered Medications  Medication Dose Route Frequency Provider Last  Rate Last Dose  . acetaminophen (TYLENOL) tablet 1,000 mg  1,000 mg Oral Q6H Lovell Sheehan, MD   1,000 mg at 08/14/16 2302  . alum & mag hydroxide-simeth (MAALOX/MYLANTA) 200-200-20 MG/5ML suspension 30 mL  30 mL Oral Q4H PRN Lovell Sheehan, MD      . aspirin EC tablet 325 mg  325 mg Oral Q breakfast Lovell Sheehan, MD   325 mg at 08/15/16 0816  . bisacodyl (DULCOLAX) suppository 10 mg  10 mg Rectal Daily PRN Lovell Sheehan, MD      . celecoxib (CELEBREX) capsule 200 mg  200 mg Oral Q12H Lovell Sheehan, MD   200 mg at 08/15/16 0817  . docusate sodium (COLACE) capsule 100 mg  100 mg Oral BID Lovell Sheehan, MD   100 mg at 08/15/16 0816  . lactated ringers infusion   Intravenous Continuous Lovell Sheehan, MD      . menthol-cetylpyridinium (CEPACOL) lozenge 3 mg  1 lozenge Oral PRN Lovell Sheehan, MD       Or  . phenol (CHLORASEPTIC) mouth spray 1 spray  1 spray Mouth/Throat PRN Lovell Sheehan, MD      . metoCLOPramide (REGLAN) tablet 5-10 mg  5-10 mg Oral Q8H PRN Lovell Sheehan, MD       Or  . metoCLOPramide (REGLAN) injection 5-10 mg  5-10 mg Intravenous Q8H PRN Kurtis Bushman  R, MD      . morphine 2 MG/ML injection 1 mg  1 mg Intravenous Q3H PRN Lovell Sheehan, MD   1 mg at 08/14/16 1834  . ondansetron (ZOFRAN) tablet 4 mg  4 mg Oral Q6H PRN Lovell Sheehan, MD       Or  . ondansetron [facility]) injection 4 mg  4 mg Intravenous Q6H PRN Lovell Sheehan, MD      . oxyCODONE (Oxy IR/ROXICODONE) immediate release tablet 5 mg  5 mg Oral Q6H PRN Lovell Sheehan, MD   5 mg at 08/15/16 0817  . polyethylene glycol (MIRALAX / GLYCOLAX) packet 17 g  17 g Oral Daily PRN Lovell Sheehan, MD      . senna [facility]) tablet 8.6 mg  1 tablet Oral BID Lovell Sheehan, MD   8.6 mg at 08/15/16 0816  . sodium phosphate (FLEET) 7-19 GM/118ML enema 1 enema  1 enema Rectal Once PRN Lovell Sheehan, MD         Discharge Medications: Please see discharge summary for a list of discharge  medications.  Relevant Imaging Results:  Relevant Lab Results:   Additional Information SS# [id number]  Zettie Pho, LCSW

## 2016-08-16 LAB — CBC
HEMATOCRIT: 28.9 % — AB (ref 35.0–47.0)
HEMOGLOBIN: 10.2 g/dL — AB (ref 12.0–16.0)
MCH: 32.5 pg (ref 26.0–34.0)
MCHC: 35.4 g/dL (ref 32.0–36.0)
MCV: 91.6 fL (ref 80.0–100.0)
Platelets: 294 10*3/uL (ref 150–440)
RBC: 3.16 MIL/uL — ABNORMAL LOW (ref 3.80–5.20)
RDW: 13.5 % (ref 11.5–14.5)
WBC: 9.1 10*3/uL (ref 3.6–11.0)

## 2016-08-16 MED ORDER — METOPROLOL TARTRATE 25 MG PO TABS: 25.0000 mg | ORAL_TABLET | Freq: Two times a day (BID) | ORAL | 0 refills | Status: DC

## 2016-08-16 MED ORDER — OXYCODONE HCL 5 MG PO TABS: 5.0000 mg | ORAL_TABLET | Freq: Four times a day (QID) | ORAL | 0 refills | Status: DC | PRN

## 2016-08-16 MED ORDER — ASPIRIN 325 MG PO TBEC: 325.0000 mg | DELAYED_RELEASE_TABLET | Freq: Every day | ORAL | 1 refills | Status: DC

## 2016-08-16 NOTE — Progress Notes (Signed)
Clinical Social Worker (CSW) contacted patient's daughter in law [name] to present bed offers however she did not answer and a voicemail was left. CSW contacted patient's son [name] and presented bed offers. Son chose [facility]. [name] called CSW back and is in agreement with [facility].   Patient is medically stable for D/C to [facility] today. Per [facility] admissions coordinator at [facility] patient can come today to room 407. RN will call report and arrange EMS for transport. CSW sent D/C orders to [facility] via [place]. Patient is aware of above. Patient's son [name] and daughter in law [name] are aware of above. Please reconsult if future social work needs arise. CSW signing off.   [employer], LCSW [phone]

## 2016-08-16 NOTE — Discharge Summary (Signed)
[facility] [place] NAME: Misty Lucas    MR#:  [id number]  DATE OF BIRTH:  1924/02/09  DATE OF ADMISSION:  08/13/2016 ADMITTING PHYSICIAN: Henreitta Leber, MD  DATE OF DISCHARGE:   PRIMARY CARE PHYSICIAN: Rusty Aus, MD    ADMISSION DIAGNOSIS:  Closed fracture of left hip, initial encounter ([place]) [S72.002A]  DISCHARGE DIAGNOSIS:  Closed Fracture Left hip s/p surgery  SECONDARY DIAGNOSIS:   Past Medical History:  Diagnosis Date  . Arthritis   . Blood clot embolism during pregnancy, antepartum 1957   left thigh  . Fibrocystic breast    left breast  . Gastritis   . History of arthroscopy of knee 1980's   right  . Hyperlipemia   . Lumbar stenosis    2 buldging disc  . Myocardial infarction [facility]) 2008    HOSPITAL COURSE:   84 year old female with past medical history of osteoporosis, fibrocystic breast disease, hyperlipidemia, lumbar stenosis who presents to the hospital due to left hip pain and noted to have a left hip fracture.  1. Preoperative cardiovascular examination-patient is a low cardiac risk for noncardiac surgery. -Patient denies any previous history of MI or any previous CVA or any vascular disease. -preoperative EKG--NSR, no acute ST-T changes -Patient tolerated surgery well.  2. Left hip fracture-secondary to a mechanical fall about a week or so ago. -ortho consult with Dr Harlow Mares appreciated -POD# 2---doing well  3. Osteoporosis-continue calcium with vitamin D supplements. -Continue diclofenac.  4. Dementia at baseline -much clear today -got 1 dose of haldol y'day  5. HTN (BP has been elevated consistently) Metoprolol 25 mg bid  DVT prophylaxis ASA per Dr Harlow Mares  D/c to rehab CONSULTS OBTAINED:  Treatment Team:  Lovell Sheehan, MD  DRUG ALLERGIES:   Allergies  Allergen Reactions  . Carafate [Sucralfate] Other (See Comments)    Unknown, pt does not remember  . Lipitor  [Atorvastatin] Other (See Comments)    Unknown, pt can't remember   . Mobic [Meloxicam] Other (See Comments)    Pt does not remember  . Penicillins Other (See Comments)    Pt does not remember  . Tramadol Other (See Comments)    Pt does not remember    DISCHARGE MEDICATIONS:   Current Discharge Medication List    START taking these medications   Details  aspirin EC 325 MG EC tablet Take 1 tablet (325 mg total) by mouth daily with breakfast. Qty: 30 tablet, Refills: 1    metoprolol tartrate (LOPRESSOR) 25 MG tablet Take 1 tablet (25 mg total) by mouth 2 (two) times daily. Qty: 60 tablet, Refills: 0    oxyCODONE (OXY IR/ROXICODONE) 5 MG immediate release tablet Take 1 tablet (5 mg total) by mouth every 6 (six) hours as needed for breakthrough pain. Qty: 20 tablet, Refills: 0      CONTINUE these medications which have NOT CHANGED   Details  acetaminophen (TYLENOL) 500 MG tablet Take 1,000 mg by mouth 3 (three) times daily.     Ascorbic Acid (VITAMIN C WITH ROSE HIPS) 500 MG tablet Take 500 mg by mouth every morning.    Calcium Carb-Cholecalciferol (OS-CAL CALCIUM + D3) 500-200 MG-UNIT TABS Take 1 tablet by mouth 2 (two) times daily.     diclofenac (VOLTAREN) 50 MG EC tablet Take 50 mg by mouth 2 (two) times daily.    docusate sodium (COLACE) 100 MG capsule Take 200 mg by mouth at bedtime.  Multiple Vitamins-Minerals (CENTRUM SILVER ULTRA WOMENS PO) Take 1 tablet by mouth every morning.    senna (SENOKOT) 8.6 MG TABS tablet Take 1 tablet by mouth daily as needed for mild constipation.        If you experience worsening of your admission symptoms, develop shortness of breath, life threatening emergency, suicidal or homicidal thoughts you must seek medical attention immediately by calling 911 or calling your MD immediately  if symptoms less severe.  You Must read complete instructions/literature along with all the possible adverse reactions/side effects for all the  Medicines you take and that have been prescribed to you. Take any new Medicines after you have completely understood and accept all the possible adverse reactions/side effects.   Please note  You were cared for by a hospitalist during your hospital stay. If you have any questions about your discharge medications or the care you received while you were in the hospital after you are discharged, you can call the unit and asked to speak with the hospitalist on call if the hospitalist that took care of you is not available. Once you are discharged, your primary care physician will handle any further medical issues. Please note that NO REFILLS for any discharge medications will be authorized once you are discharged, as it is imperative that you return to your primary care physician (or establish a relationship with a primary care physician if you do not have one) for your aftercare needs so that they can reassess your need for medications and monitor your lab values. Today   SUBJECTIVE   Eating apple sauce No new issues  VITAL SIGNS:  Blood pressure (!) 160/69, pulse 97, temperature 98.4 F (36.9 C), temperature source Oral, resp. rate 16, height 5' (1.524 m), weight 43.5 kg (95 lb 14.4 oz), SpO2 96 %.  I/O:   Intake/Output Summary (Last 24 hours) at 08/16/16 0837 Last data filed at 08/15/16 2135  Gross per 24 hour  Intake              360 ml  Output                0 ml  Net              360 ml    PHYSICAL EXAMINATION:  GENERAL:  83 y.o.-year-old patient lying in the bed with no acute distress.  EYES: Pupils equal, round, reactive to light and accommodation. No scleral icterus. Extraocular muscles intact.  HEENT: Head atraumatic, normocephalic. Oropharynx and nasopharynx clear.  NECK:  Supple, no jugular venous distention. No thyroid enlargement, no tenderness.  LUNGS: Normal breath sounds bilaterally, no wheezing, rales,rhonchi or crepitation. No use of accessory muscles of respiration.   CARDIOVASCULAR: S1, S2 normal. No murmurs, rubs, or gallops.  ABDOMEN: Soft, non-tender, non-distended. Bowel sounds present. No organomegaly or mass.  EXTREMITIES: No pedal edema, cyanosis, or clubbing.  NEUROLOGIC: Cranial nerves II through XII are intact. Muscle strength 5/5 in all extremities. Sensation intact. Gait not checked.  PSYCHIATRIC: The patient is alert and awake SKIN: No obvious rash, lesion, or ulcer.   DATA REVIEW:   CBC   Recent Labs Lab 08/16/16 0413  WBC 9.1  HGB 10.2*  HCT 28.9*  PLT 294    Chemistries   Recent Labs Lab 08/13/16 1630  08/15/16 0350  NA 136  < > 138  K 4.2  < > 3.6  CL 102  < > 104  CO2 28  < > 27  GLUCOSE 98  < >  142*  BUN 19  < > 12  CREATININE 0.85  < > 0.88  CALCIUM 8.8*  < > 8.8*  AST 25  --   --   ALT 18  --   --   ALKPHOS 62  --   --   BILITOT 0.4  --   --   < > = values in this interval not displayed.  Microbiology Results   Recent Results (from the past 240 hour(s))  Surgical pcr screen     Status: None   Collection Time: 08/13/16  9:53 PM  Result Value Ref Range Status   MRSA, PCR NEGATIVE NEGATIVE Final   Staphylococcus aureus NEGATIVE NEGATIVE Final    Comment:        The Xpert SA Assay (FDA approved for NASAL specimens in patients over 18 years of age), is one component of a comprehensive surveillance program.  Test performance has been validated by [facility] for patients greater than or equal to 21 year old. It is not intended to diagnose infection nor to guide or monitor treatment.     RADIOLOGY:  Dg Pelvis Portable  Result Date: 08/14/2016 CLINICAL DATA:  Post-op left anterior hip replacement EXAM: PORTABLE PELVIS 1-2 VIEWS COMPARISON:  06/24/2016, 06/08/2016 FINDINGS: Patient has undergone left hip hemiarthroplasty. The femoral head component appears well seated in the acetabulum on the frontal views performed. Surgical clips overlie the left hip. Postoperative gas is present. Degenerative  changes are noted in the right hip. Degenerative changes are seen in the spine. IMPRESSION: Postoperative appearance of left hip hemiarthroplasty. No adverse features. Electronically Signed   By: Nolon Nations M.D.   On: 08/14/2016 14:35   Dg Hip Operative Unilat W Or W/o Pelvis Left  Result Date: 08/14/2016 CLINICAL DATA:  Intra-op left anterior hip replacement, fluoro time 12 sec EXAM: OPERATIVE LEFT HIP (WITH PELVIS IF PERFORMED)  VIEWS TECHNIQUE: Fluoroscopic spot image(s) were submitted for interpretation post-operatively. COMPARISON:  None. FINDINGS: Four intraoperative fluoroscopic spot images are provided showing placement of left hip arthroplasty hardware. Final images show the hardware to be appropriately positioned. Adjacent osseous structures appear anatomic in configuration. Fluoroscopy provided for 12 seconds. IMPRESSION: Intraoperative fluoroscopic spot images demonstrating left hip arthroplasty. Hardware appears appropriately positioned. No evidence of surgical complicating feature. Electronically Signed   By: Franki Cabot M.D.   On: 08/14/2016 13:34     Management plans discussed with the patient, family and they are in agreement.  CODE STATUS:     Code Status Orders        Start     Ordered   08/14/16 0812  Full code  Continuous     08/14/16 0811    Code Status History    Date Active Date Inactive Code Status Order ID Comments User Context   08/13/2016  8:07 PM 08/14/2016  8:11 AM DNR [id number]  Henreitta Leber, MD ED    Advance Directive Documentation     Most Recent Value  Type of Advance Directive  Healthcare Power of Attorney  Pre-existing out of facility DNR order (yellow form or pink MOST form)  -  "MOST" Form in Place?  -      TOTAL TIME TAKING CARE OF THIS PATIENT: 40 minutes.    Brynnlee Cumpian M.D on 08/16/2016 at 8:37 AM  Between 7am to 6pm - Pager - [phone] After 6pm go to [email or web address] - password [email or web address] [facility]  Office   [phone]  CC: Primary care physician; Emily Filbert  F, MD

## 2016-08-16 NOTE — Care Management Important Message (Signed)
Important Message  Patient Details  Name: FREDDA CLARIDA MRN: [id number] Date of Birth: 01-01-1925   Medicare Important Message Given:  Yes    Jolly Mango, RN 08/16/2016, 9:14 AM

## 2016-08-16 NOTE — Clinical Social Work Placement (Signed)
   CLINICAL SOCIAL WORK PLACEMENT  NOTE  Date:  08/16/2016  Patient Details  Name: Misty Lucas MRN: [id number] Date of Birth: 11/13/1924  Clinical Social Work is seeking post-discharge placement for this patient at the [place] level of care (*CSW will initial, date and re-position this form in  chart as items are completed):  Yes   Patient/family provided with [place] Work Department's list of facilities offering this level of care within the geographic area requested by the patient (or if unable, by the patient's family).  Yes   Patient/family informed of their freedom to choose among providers that offer the needed level of care, that participate in Medicare, Medicaid or managed care program needed by the patient, have an available bed and are willing to accept the patient.  Yes   Patient/family informed of [place]'s ownership interest in [facility] and [facility], as well as of the fact that they are under no obligation to receive care at these facilities.  PASRR submitted to EDS on 08/15/16     PASRR number received on 08/15/16     Existing PASRR number confirmed on       FL2 transmitted to all facilities in geographic area requested by pt/family on 08/15/16     FL2 transmitted to all facilities within larger geographic area on       Patient informed that his/her managed care company has contracts with or will negotiate with certain facilities, including the following:        Yes   Patient/family informed of bed offers received.  Patient chooses bed at  [facility] )     Physician recommends and patient chooses bed at      Patient to be transferred to  [facility] ) on 08/16/16.  Patient to be transferred to facility by  [facility] )     Patient family notified on 08/16/16 of transfer.  Name of family member notified:   (Patient's son [name] and daughter in law [name] are aware of D/C today. )      PHYSICIAN       Additional Comment:    _______________________________________________ Ramzi Brathwaite, Veronia Beets, LCSW 08/16/2016, 11:09 AM

## 2016-08-16 NOTE — Progress Notes (Signed)
Physical Therapy Treatment Patient Details Name: Misty Lucas MRN: [id number] DOB: [birth year] Today's Date: 08/16/2016    History of Present Illness Pt is a 83 yo F, admitted to acute care on 8/3 for closed L hip fx, which led to ant hip hemiarthroplasty on 8/4. Pt is currently post-op day 1. Prior to admission, pt was resident at [facility]. PMH: arthritis, blood clot embolism during pregnancy, fibrocystic breast, gastritis, hx of R knee arthroscopy, HLD, lumbar stenosis, MI and OP.     PT Comments    Pt agreeable to PT; pain noted in left hip with movement 4/10 through face scale. Pt participates well with supine exercises with assist throughout and greater assist on left. Mild muscle guarding/operative site guarding on left with movement. Pt to transfer to skilled nursing facility today for continued rehab efforts.    Follow Up Recommendations  SNF     Equipment Recommendations       Recommendations for Other Services       Precautions / Restrictions Restrictions Weight Bearing Restrictions: Yes LLE Weight Bearing: Weight bearing as tolerated    Mobility  Bed Mobility               General bed mobility comments: Not tested  Transfers                    Ambulation/Gait                 Stairs            Wheelchair Mobility    Modified Rankin (Stroke Patients Only)       Balance                                            Cognition Arousal/Alertness: Awake/alert Behavior During Therapy: Anxious (at times with LLE movement) Overall Cognitive Status: No family/caregiver present to determine baseline cognitive functioning                                        Exercises Total Joint Exercises Ankle Circles/Pumps: AAROM;Both;10 reps;Supine Quad Sets: Strengthening;Both;10 reps;Supine Gluteal Sets: Strengthening;Both;10 reps;Supine Short Arc Quad: AAROM;Both;10 reps;Supine Heel Slides: AAROM;Both;10  reps;Supine Hip ABduction/ADduction: AAROM;Both;10 reps;Supine Straight Leg Raises: AAROM;Both;10 reps;Supine    General Comments        Pertinent Vitals/Pain Faces Pain Scale: Hurts little more Pain Location: L hip  Pain Descriptors / Indicators: Operative site guarding Pain Intervention(s): Monitored during session;Limited activity within patient's tolerance    Home Living                      Prior Function            PT Goals (current goals can now be found in the care plan section)      Frequency    BID      PT Plan Current plan remains appropriate    Co-evaluation              AM-PAC PT "6 Clicks" Daily Activity  Outcome Measure  Difficulty turning over in bed (including adjusting bedclothes, sheets and blankets)?: Total Difficulty moving from lying on back to sitting on the side of the bed? : Total Difficulty sitting down on and standing up from a chair  with arms (e.g., wheelchair, bedside commode, etc,.)?: Total Help needed moving to and from a bed to chair (including a wheelchair)?: Total Help needed walking in hospital room?: Total Help needed climbing 3-5 steps with a railing? : Total 6 Click Score: 6    End of Session   Activity Tolerance: Patient tolerated treatment well Patient left: in bed;with call bell/phone within reach;with bed alarm set   PT Visit Diagnosis: Unsteadiness on feet (R26.81);Other abnormalities of gait and mobility (R26.89);Muscle weakness (generalized) (M62.81);Pain Pain - Right/Left: Left Pain - part of body: Hip     Time: 0712-1975 PT Time Calculation (min) (ACUTE ONLY): 20 min  Charges:  $Therapeutic Exercise: 8-22 mins                    G Codes:       Misty Lucas, PTA 08/16/2016, 12:13 PM

## 2016-08-16 NOTE — Progress Notes (Signed)
Called report to [place], LPN @ [facility]. Answered all questions. EMS called for transport.

## 2016-08-17 ENCOUNTER — Encounter: Payer: Self-pay | Admitting: Orthopedic Surgery

## 2016-08-18 LAB — SURGICAL PATHOLOGY

## 2016-08-31 ENCOUNTER — Emergency Department: Payer: Medicare Other

## 2016-08-31 ENCOUNTER — Emergency Department
Admission: EM | Admit: 2016-08-31 | Discharge: 2016-09-01 | Disposition: A | Discharge status: Home / Self Care | Payer: Medicare Other | Attending: Student in an Organized Health Care Education/Training Program | Admitting: Student in an Organized Health Care Education/Training Program

## 2016-08-31 DIAGNOSIS — Z87891 Personal history of nicotine dependence: Secondary | ICD-10-CM | POA: Diagnosis not present

## 2016-08-31 DIAGNOSIS — Z7982 Long term (current) use of aspirin: Secondary | ICD-10-CM | POA: Insufficient documentation

## 2016-08-31 DIAGNOSIS — R079 Chest pain, unspecified: Secondary | ICD-10-CM | POA: Diagnosis present

## 2016-08-31 DIAGNOSIS — Z96642 Presence of left artificial hip joint: Secondary | ICD-10-CM | POA: Insufficient documentation

## 2016-08-31 DIAGNOSIS — Z79899 Other long term (current) drug therapy: Secondary | ICD-10-CM | POA: Diagnosis not present

## 2016-08-31 LAB — CBC WITH DIFFERENTIAL/PLATELET
BASOS ABS: 0.1 10*3/uL (ref 0–0.1)
Basophils Relative: 1 %
EOS ABS: 0.2 10*3/uL (ref 0–0.7)
Eosinophils Relative: 1 %
HCT: 31.9 % — ABNORMAL LOW (ref 35.0–47.0)
HEMOGLOBIN: 10.6 g/dL — AB (ref 12.0–16.0)
Lymphocytes Relative: 6 %
Lymphs Abs: 0.8 10*3/uL — ABNORMAL LOW (ref 1.0–3.6)
MCH: 30.3 pg (ref 26.0–34.0)
MCHC: 33.3 g/dL (ref 32.0–36.0)
MCV: 91 fL (ref 80.0–100.0)
MONO ABS: 0.9 10*3/uL (ref 0.2–0.9)
Monocytes Relative: 7 %
NEUTROS ABS: 11.6 10*3/uL — AB (ref 1.4–6.5)
Neutrophils Relative %: 85 %
PLATELETS: 633 10*3/uL — AB (ref 150–440)
RBC: 3.5 MIL/uL — AB (ref 3.80–5.20)
RDW: 14 % (ref 11.5–14.5)
WBC: 13.6 10*3/uL — ABNORMAL HIGH (ref 3.6–11.0)

## 2016-08-31 LAB — COMPREHENSIVE METABOLIC PANEL
ALK PHOS: 86 U/L (ref 38–126)
ALT: 15 U/L (ref 14–54)
AST: 20 U/L (ref 15–41)
Albumin: 2.8 g/dL — ABNORMAL LOW (ref 3.5–5.0)
Anion gap: 7 (ref 5–15)
BUN: 16 mg/dL (ref 6–20)
CHLORIDE: 102 mmol/L (ref 101–111)
CO2: 25 mmol/L (ref 22–32)
CREATININE: 0.78 mg/dL (ref 0.44–1.00)
Calcium: 8.7 mg/dL — ABNORMAL LOW (ref 8.9–10.3)
GFR calc Af Amer: >60 mL/min (ref >60)
GFR calc non Af Amer: >60 mL/min (ref >60)
GLUCOSE: 138 mg/dL — AB (ref 65–99)
Potassium: 3.8 mmol/L (ref 3.5–5.1)
SODIUM: 134 mmol/L — AB (ref 135–145)
Total Bilirubin: 0.4 mg/dL (ref 0.3–1.2)
Total Protein: 6.4 g/dL — ABNORMAL LOW (ref 6.5–8.1)

## 2016-08-31 LAB — TROPONIN I: Troponin I: <0.03 ng/mL (ref <0.03)

## 2016-08-31 LAB — LIPASE, BLOOD: Lipase: 32 U/L (ref 11–51)

## 2016-08-31 MED ORDER — SODIUM CHLORIDE 0.9 % IV BOLUS (SEPSIS)
250.0000 mL | Freq: Once | INTRAVENOUS | Status: AC
  Administered 2016-08-31: 250 mL via INTRAVENOUS

## 2016-08-31 MED ORDER — IOPAMIDOL (ISOVUE-370) INJECTION 76%
75.0000 mL | Freq: Once | INTRAVENOUS | Status: AC | PRN
  Administered 2016-08-31: 75 mL via INTRAVENOUS

## 2016-08-31 MED ORDER — AMLODIPINE BESYLATE 5 MG PO TABS
5.0000 mg | ORAL_TABLET | Freq: Once | ORAL | Status: AC
  Administered 2016-08-31: 5 mg via ORAL
  Filled 2016-08-31: qty 1

## 2016-08-31 MED ORDER — ACETAMINOPHEN 325 MG PO TABS
650.0000 mg | ORAL_TABLET | Freq: Once | ORAL | Status: AC
  Administered 2016-08-31: 650 mg via ORAL
  Filled 2016-08-31: qty 2

## 2016-08-31 NOTE — ED Provider Notes (Signed)
[facility] Emergency Department Provider Note    First MD Initiated Contact with Patient 08/31/16 Bosie Helper     (approximate)  I have reviewed the triage vital signs and the nursing notes.   HISTORY  Chief Complaint Chest Pain    HPI Misty Lucas is a 83 y.o. female who presents from [facility] 2 and half weeks status post left hip arthroplasty which she complaint of chest pain. Patient does have a reported history of dementia. States that she has been having chest pain and ill on and off for the past 2 months. States she was having chest pain that is squeezing in the epigastric region and going around her chest this morning. No associated nausea. EMS was called and patient was given nitroglycerin. Patient without any pain at this time. Patient states that she was at [facility] she can't remember why. It appears the patient is recalling her admission to this facility for operative fixation of hip fracture.   Past Medical History:  Diagnosis Date  . Arthritis   . Blood clot embolism during pregnancy, antepartum 1957   left thigh  . Fibrocystic breast    left breast  . Gastritis   . History of arthroscopy of knee 1980's   right  . Hyperlipemia   . Lumbar stenosis    2 buldging disc  . Myocardial infarction [facility]) 2008   Family History  Problem Relation Age of Onset  . Heart attack Mother   . Tuberculosis Father   . Dementia Father    Past Surgical History:  Procedure Laterality Date  . ABDOMINAL HYSTERECTOMY    . APPENDECTOMY    . BREAST SURGERY    . CARPAL TUNNEL RELEASE Bilateral 2007  . GUM SURGERY  1980's  . HIP ARTHROPLASTY Left 08/14/2016   Procedure: ARTHROPLASTY BIPOLAR HIP (HEMIARTHROPLASTY);  Surgeon: Lovell Sheehan, MD;  Location: [facility] ORS;  Service: Orthopedics;  Laterality: Left;  . KNEE ARTHROSCOPY Bilateral 1980's   Patient Active Problem List   Diagnosis Date Noted  . Closed left hip fracture ([place]) 08/13/2016      Prior to  Admission medications   Medication Sig Start Date End Date Taking? Authorizing Provider  acetaminophen (TYLENOL) 500 MG tablet Take 1,000 mg by mouth 3 (three) times daily.     [provider]  Ascorbic Acid (VITAMIN C WITH ROSE HIPS) 500 MG tablet Take 500 mg by mouth every morning.    [provider]  aspirin EC 325 MG EC tablet Take 1 tablet (325 mg total) by mouth daily with breakfast. 08/17/16   Fritzi Mandes, MD  Calcium Carb-Cholecalciferol (OS-CAL CALCIUM + D3) 500-200 MG-UNIT TABS Take 1 tablet by mouth 2 (two) times daily.     [provider]  diclofenac (VOLTAREN) 50 MG EC tablet Take 50 mg by mouth 2 (two) times daily.    [provider]  docusate sodium (COLACE) 100 MG capsule Take 200 mg by mouth at bedtime.     [provider]  metoprolol tartrate (LOPRESSOR) 25 MG tablet Take 1 tablet (25 mg total) by mouth 2 (two) times daily. 08/16/16   Fritzi Mandes, MD  Multiple Vitamins-Minerals (CENTRUM SILVER ULTRA WOMENS PO) Take 1 tablet by mouth every morning.    [provider]  oxyCODONE (OXY IR/ROXICODONE) 5 MG immediate release tablet Take 1 tablet (5 mg total) by mouth every 6 (six) hours as needed for breakthrough pain. 08/16/16   Fritzi Mandes, MD  senna (SENOKOT) 8.6 MG TABS  tablet Take 1 tablet by mouth daily as needed for mild constipation.    [provider]    Allergies Carafate [sucralfate]; Lipitor [atorvastatin]; Mobic [meloxicam]; Penicillins; and Tramadol    Social History Social History  Substance Use Topics  . Smoking status: Former Smoker    Types: Cigarettes    Quit date: 03/26/1967  . Smokeless tobacco: Never Used  . Alcohol use No    Review of Systems Patient denies headaches, rhinorrhea, blurry vision, numbness, shortness of breath, chest pain, edema, cough, abdominal pain, nausea, vomiting, diarrhea, dysuria, fevers, rashes or hallucinations unless otherwise stated above in  HPI. ____________________________________________   PHYSICAL EXAM:  VITAL SIGNS: Vitals:   08/31/16 2300 08/31/16 2330  BP: (!) 175/123 (!) 182/70  Pulse: 97 90  Resp: (!) 23 (!) 23  SpO2: 96% 94%    Constitutional: Alert, in no acute distress. Eyes: Conjunctivae are normal.  Head: Atraumatic. Nose: No congestion/rhinnorhea. Mouth/Throat: Mucous membranes are moist.   Neck: No stridor. Painless ROM.  Cardiovascular: Normal rate, regular rhythm. Grossly normal heart sounds.  Good peripheral circulation. Respiratory: Normal respiratory effort.  No retractions. Lungs CTAB. Gastrointestinal: Soft and nontender. No distention. No abdominal bruits. No CVA tenderness. Genitourinary:  Musculoskeletal: No lower extremity tenderness , left hip appears appropriate in the post op phase, no erythema, + edema, compartments are soft.  No joint effusions. Neurologic:  Normal speech and language. No gross focal neurologic deficits are appreciated. No facial droop Skin:  Skin is warm, dry and intact. No rash noted. Psychiatric: Mood and affect are normal. Speech and behavior are normal.  ____________________________________________   LABS (all labs ordered are listed, but only abnormal results are displayed)  Results for orders placed or performed during the hospital encounter of 08/31/16 (from the past 24 hour(s))  CBC with Differential/Platelet     Status: Abnormal   Collection Time: 08/31/16  6:48 PM  Result Value Ref Range   WBC 13.6 (H) 3.6 - 11.0 K/uL   RBC 3.50 (L) 3.80 - 5.20 MIL/uL   Hemoglobin 10.6 (L) 12.0 - 16.0 g/dL   HCT 31.9 (L) 35.0 - 47.0 %   MCV 91.0 80.0 - 100.0 fL   MCH 30.3 26.0 - 34.0 pg   MCHC 33.3 32.0 - 36.0 g/dL   RDW 14.0 11.5 - 14.5 %   Platelets 633 (H) 150 - 440 K/uL   Neutrophils Relative % 85 %   Neutro Abs 11.6 (H) 1.4 - 6.5 K/uL   Lymphocytes Relative 6 %   Lymphs Abs 0.8 (L) 1.0 - 3.6 K/uL   Monocytes Relative 7 %   Monocytes Absolute 0.9 0.2 -  0.9 K/uL   Eosinophils Relative 1 %   Eosinophils Absolute 0.2 0 - 0.7 K/uL   Basophils Relative 1 %   Basophils Absolute 0.1 0 - 0.1 K/uL  Comprehensive metabolic panel     Status: Abnormal   Collection Time: 08/31/16  6:48 PM  Result Value Ref Range   Sodium 134 (L) 135 - 145 mmol/L   Potassium 3.8 3.5 - 5.1 mmol/L   Chloride 102 101 - 111 mmol/L   CO2 25 22 - 32 mmol/L   Glucose, Bld 138 (H) 65 - 99 mg/dL   BUN 16 6 - 20 mg/dL   Creatinine, Ser 0.78 0.44 - 1.00 mg/dL   Calcium 8.7 (L) 8.9 - 10.3 mg/dL   Total Protein 6.4 (L) 6.5 - 8.1 g/dL   Albumin 2.8 (L) 3.5 - 5.0 g/dL  AST 20 15 - 41 U/L   ALT 15 14 - 54 U/L   Alkaline Phosphatase 86 38 - 126 U/L   Total Bilirubin 0.4 0.3 - 1.2 mg/dL   GFR calc non Af Amer >60 >60 mL/min   GFR calc Af Amer >60 >60 mL/min   Anion gap 7 5 - 15  Lipase, blood     Status: None   Collection Time: 08/31/16  6:48 PM  Result Value Ref Range   Lipase 32 11 - 51 U/L  Troponin I     Status: None   Collection Time: 08/31/16  6:48 PM  Result Value Ref Range   Troponin I <0.03 <0.03 ng/mL   ____________________________________________  EKG My review and personal interpretation at Time: 18:47   Indication: chest pain  Rate: 85  Rhythm: sinus Axis: normal  Other: infero lateral t wave inversions and incomplete rbbb unchanged from previous 08/13/16 ____________________________________________  RADIOLOGY  I personally reviewed all radiographic images ordered to evaluate for the above acute complaints and reviewed radiology reports and findings.  These findings were personally discussed with the patient.  Please see medical record for radiology report.  ____________________________________________   PROCEDURES  Procedure(s) performed:  Procedures    Critical Care performed: no ____________________________________________   INITIAL IMPRESSION / ASSESSMENT AND PLAN / ED COURSE  Pertinent labs & imaging results that were available during  my care of the patient were reviewed by me and considered in my medical decision making (see chart for details).  DDX: ACS, pericarditis, esophagitis, boerhaaves, pe, dissection, pna, bronchitis, costochondritis   Misty Lucas is a 83 y.o. who presents to the ED with atypical chest discomfort as described above. Her abdominal exam is soft and benign. EKG shows nonspecific changes but there are no changes compared to her preop surgery and she was able to make it through orthopedic surgery without any complications. Trop negative.  Patient otherwise afebrile and hemodynamically stable. Does have a mild leukocytosis which would be appropriate in the postoperative phase. No significant blood loss anemia. Based on her symptoms she has high risk for PE will order CT angiogram to further characterize.  The patient will be placed on continuous pulse oximetry and telemetry for monitoring.  Laboratory evaluation will be sent to evaluate for the above complaints.     Clinical Course as of Sep 02 2  Tue Aug 31, 2016  2241 Glucose: (!) 138 [PR]  2241 No evidence of pulmonary embolism. Patient remains dynamically stable after discussion with son would like to have patient transferred back to [facility]  [PR]  2253 CT angiogram negative. I discussed the case with the patient's family on the phone. I spoke with them at bedside earlier today. Discussed that she does have mildly abnormal EKG but is unchanged from previous. We discussed that her troponin is negative and she does not have any evidence of any chest pain right now despite having several days of pain per her report. I have offered admission to hospital for observation and further management but after discussing her since of dementia with family and concern for worsening of her confusion the family has preferred her to be further worked up as an outpatient which I do believe is reasonable. She does not appear to be in any acute distress or discomfort at this time.  She appears comfortable.  [PR]    Clinical Course User Index [PR] Merlyn Lot, MD     ____________________________________________   FINAL CLINICAL IMPRESSION(S) / ED DIAGNOSES  Final diagnoses:  Chest pain, unspecified type      NEW MEDICATIONS STARTED DURING THIS VISIT:  New Prescriptions   No medications on file     Note:  This document was prepared using Dragon voice recognition software and may include unintentional dictation errors.    Merlyn Lot, MD 09/01/16 [phone]

## 2016-08-31 NOTE — ED Triage Notes (Signed)
Pt. Arrived via EMS from [facility] facility with chest pain. EMS reported pt had a few PACs on the EKG along with HTN in the 190s. Pt was given Nitro and 324 ASA per EMS. They also inserted a 20G in the left AC. EMS reported that pt has been doing femur fracture rehabilitation and has a left leg splint. Pt also skin tear on left arm.

## 2016-08-31 NOTE — ED Notes (Signed)
Misty Lucas (son) [phone] (home) [phone] (cell) [phone] (work)

## 2016-09-01 NOTE — ED Notes (Signed)
Pt gave verbal consent for E-signature,. Pad not working.

## 2016-09-08 ENCOUNTER — Emergency Department: Payer: Medicare Other

## 2016-09-08 ENCOUNTER — Encounter: Payer: Self-pay | Admitting: Emergency Medicine

## 2016-09-08 ENCOUNTER — Emergency Department
Admission: EM | Admit: 2016-09-08 | Discharge: 2016-09-09 | Disposition: A | Discharge status: Home / Self Care | Payer: Medicare Other | Attending: Emergency Medicine | Admitting: Emergency Medicine

## 2016-09-08 DIAGNOSIS — K5641 Fecal impaction: Secondary | ICD-10-CM | POA: Diagnosis not present

## 2016-09-08 DIAGNOSIS — K5900 Constipation, unspecified: Secondary | ICD-10-CM

## 2016-09-08 DIAGNOSIS — Z96642 Presence of left artificial hip joint: Secondary | ICD-10-CM | POA: Insufficient documentation

## 2016-09-08 DIAGNOSIS — Z87891 Personal history of nicotine dependence: Secondary | ICD-10-CM | POA: Insufficient documentation

## 2016-09-08 DIAGNOSIS — Z79899 Other long term (current) drug therapy: Secondary | ICD-10-CM | POA: Insufficient documentation

## 2016-09-08 LAB — CBC WITH DIFFERENTIAL/PLATELET
BASOS ABS: 0.1 10*3/uL (ref 0–0.1)
BASOS PCT: 1 %
EOS ABS: 0.1 10*3/uL (ref 0–0.7)
EOS PCT: 1 %
HCT: 33.7 % — ABNORMAL LOW (ref 35.0–47.0)
Hemoglobin: 11.5 g/dL — ABNORMAL LOW (ref 12.0–16.0)
Lymphocytes Relative: 5 %
Lymphs Abs: 0.6 10*3/uL — ABNORMAL LOW (ref 1.0–3.6)
MCH: 30.4 pg (ref 26.0–34.0)
MCHC: 34.2 g/dL (ref 32.0–36.0)
MCV: 88.8 fL (ref 80.0–100.0)
MONO ABS: 1.1 10*3/uL — AB (ref 0.2–0.9)
Monocytes Relative: 9 %
Neutro Abs: 10.5 10*3/uL — ABNORMAL HIGH (ref 1.4–6.5)
Neutrophils Relative %: 84 %
PLATELETS: 604 10*3/uL — AB (ref 150–440)
RBC: 3.79 MIL/uL — ABNORMAL LOW (ref 3.80–5.20)
RDW: 13.9 % (ref 11.5–14.5)
WBC: 12.3 10*3/uL — ABNORMAL HIGH (ref 3.6–11.0)

## 2016-09-08 LAB — BASIC METABOLIC PANEL
ANION GAP: 9 (ref 5–15)
BUN: 18 mg/dL (ref 6–20)
CALCIUM: 9.1 mg/dL (ref 8.9–10.3)
CO2: 25 mmol/L (ref 22–32)
Chloride: 97 mmol/L — ABNORMAL LOW (ref 101–111)
Creatinine, Ser: 0.75 mg/dL (ref 0.44–1.00)
GLUCOSE: 144 mg/dL — AB (ref 65–99)
Potassium: 4.2 mmol/L (ref 3.5–5.1)
Sodium: 131 mmol/L — ABNORMAL LOW (ref 135–145)

## 2016-09-08 LAB — HEPATIC FUNCTION PANEL
ALT: 15 U/L (ref 14–54)
AST: 23 U/L (ref 15–41)
Albumin: 3 g/dL — ABNORMAL LOW (ref 3.5–5.0)
Alkaline Phosphatase: 80 U/L (ref 38–126)
Total Bilirubin: 0.5 mg/dL (ref 0.3–1.2)
Total Protein: 7.2 g/dL (ref 6.5–8.1)

## 2016-09-08 LAB — LACTIC ACID, PLASMA: LACTIC ACID, VENOUS: 1.4 mmol/L (ref 0.5–1.9)

## 2016-09-08 LAB — LIPASE, BLOOD: Lipase: 22 U/L (ref 11–51)

## 2016-09-08 LAB — TROPONIN I: TROPONIN I: 0.03 ng/mL — AB (ref <0.03)

## 2016-09-08 MED ORDER — LIDOCAINE HCL 2 % EX GEL: 1.0000 "application " | Freq: Once | CUTANEOUS | Status: DC

## 2016-09-08 MED ORDER — SODIUM CHLORIDE 0.9 % IV BOLUS (SEPSIS)
1000.0000 mL | Freq: Once | INTRAVENOUS | Status: AC
  Administered 2016-09-08: 1000 mL via INTRAVENOUS

## 2016-09-08 MED ORDER — IOPAMIDOL (ISOVUE-370) INJECTION 76%
100.0000 mL | Freq: Once | INTRAVENOUS | Status: AC | PRN
  Administered 2016-09-08: 100 mL via INTRAVENOUS

## 2016-09-08 MED ORDER — LIDOCAINE HCL 2 % EX GEL
CUTANEOUS | Status: AC
  Filled 2016-09-08: qty 10

## 2016-09-08 NOTE — ED Triage Notes (Signed)
Pt. Here via EMS from [place].  Pt. States lower abdominal pain with constipation for the past 3 days.

## 2016-09-08 NOTE — ED Notes (Signed)
Pt. Stated "My back hurts" Pt. repositioned on side.  Pt. Stated, " I am still cold".  Pt. Given additional warm blankets.

## 2016-09-08 NOTE — ED Notes (Signed)
Lab called to report elevated troponin of 0.03, results given to Dr. Forest Gleason.

## 2016-09-08 NOTE — Discharge Instructions (Signed)
Please increase the amount of fiber in your diet and make sure you remain well-hydrated. Follow-up with your primary care physician as needed.  It was a pleasure to take care of you today, and thank you for coming to our emergency department.  If you have any questions or concerns before leaving please ask the nurse to grab me and I'm more than happy to go through your aftercare instructions again.  If you were prescribed any opioid pain medication today such as Norco, Vicodin, Percocet, morphine, hydrocodone, or oxycodone please make sure you do not drive when you are taking this medication as it can alter your ability to drive safely.  If you have any concerns once you are home that you are not improving or are in fact getting worse before you can make it to your follow-up appointment, please do not hesitate to call [phone] and come back for further evaluation.  Darel Hong, MD  Results for orders placed or performed during the hospital encounter of 29/52/84  Basic metabolic panel  Result Value Ref Range   Sodium 131 (L) 135 - 145 mmol/L   Potassium 4.2 3.5 - 5.1 mmol/L   Chloride 97 (L) 101 - 111 mmol/L   CO2 25 22 - 32 mmol/L   Glucose, Bld 144 (H) 65 - 99 mg/dL   BUN 18 6 - 20 mg/dL   Creatinine, Ser 0.75 0.44 - 1.00 mg/dL   Calcium 9.1 8.9 - 10.3 mg/dL   GFR calc non Af Amer >60 >60 mL/min   GFR calc Af Amer >60 >60 mL/min   Anion gap 9 5 - 15  Hepatic function panel  Result Value Ref Range   Total Protein 7.2 6.5 - 8.1 g/dL   Albumin 3.0 (L) 3.5 - 5.0 g/dL   AST 23 15 - 41 U/L   ALT 15 14 - 54 U/L   Alkaline Phosphatase 80 38 - 126 U/L   Total Bilirubin 0.5 0.3 - 1.2 mg/dL   Bilirubin, Direct <0.1 (L) 0.1 - 0.5 mg/dL   Indirect Bilirubin NOT CALCULATED 0.3 - 0.9 mg/dL  Lipase, blood  Result Value Ref Range   Lipase 22 11 - 51 U/L  Troponin I  Result Value Ref Range   Troponin I 0.03 (HH) <0.03 ng/mL  Lactic acid, plasma  Result Value Ref Range   Lactic Acid, Venous 1.4  0.5 - 1.9 mmol/L  CBC with Differential  Result Value Ref Range   WBC 12.3 (H) 3.6 - 11.0 K/uL   RBC 3.79 (L) 3.80 - 5.20 MIL/uL   Hemoglobin 11.5 (L) 12.0 - 16.0 g/dL   HCT 33.7 (L) 35.0 - 47.0 %   MCV 88.8 80.0 - 100.0 fL   MCH 30.4 26.0 - 34.0 pg   MCHC 34.2 32.0 - 36.0 g/dL   RDW 13.9 11.5 - 14.5 %   Platelets 604 (H) 150 - 440 K/uL   Neutrophils Relative % 84 %   Neutro Abs 10.5 (H) 1.4 - 6.5 K/uL   Lymphocytes Relative 5 %   Lymphs Abs 0.6 (L) 1.0 - 3.6 K/uL   Monocytes Relative 9 %   Monocytes Absolute 1.1 (H) 0.2 - 0.9 K/uL   Eosinophils Relative 1 %   Eosinophils Absolute 0.1 0 - 0.7 K/uL   Basophils Relative 1 %   Basophils Absolute 0.1 0 - 0.1 K/uL   Dg Chest 1 View  Result Date: 08/13/2016 CLINICAL DATA:  Left hip fracture.  Fell 2 weeks ago. EXAM: CHEST 1  VIEW COMPARISON:  None. FINDINGS: Heart size near the upper limit of normal. Tortuous aorta. Mildly hyperexpanded lungs with mildly prominent interstitial markings. Mild to moderate levoconvex thoracolumbar scoliosis and degenerative changes. Diffuse osteopenia. IMPRESSION: No acute abnormality.  Mild changes of COPD. Electronically Signed   By: Claudie Revering M.D.   On: 08/13/2016 18:40   Ct Angio Chest Pe W And/or Wo Contrast  Result Date: 08/31/2016 CLINICAL DATA:  Chest pain. EXAM: CT ANGIOGRAPHY CHEST WITH CONTRAST TECHNIQUE: Multidetector CT imaging of the chest was performed using the standard protocol during bolus administration of intravenous contrast. Multiplanar CT image reconstructions and MIPs were obtained to evaluate the vascular anatomy. CONTRAST:  75 mL of Isovue 370 intravenously. COMPARISON:  Radiographs of same day. FINDINGS: Cardiovascular: Satisfactory opacification of the pulmonary arteries to the segmental level. No evidence of pulmonary embolism. Normal heart size. No pericardial effusion. Atherosclerosis of thoracic aorta is noted. Mediastinum/Nodes: 2 cm left thyroid nodule is noted. No mediastinal  adenopathy is noted. The esophagus is unremarkable. Lungs/Pleura: Lungs are clear. No pleural effusion or pneumothorax. Upper Abdomen: No acute abnormality. Musculoskeletal: No chest wall abnormality. No acute or significant osseous findings. Review of the MIP images confirms the above findings. IMPRESSION: No definite evidence of pulmonary embolus. 2 cm left thyroid nodule is noted. Thyroid ultrasound is recommended for further evaluation. Aortic Atherosclerosis (ICD10-I70.0). Electronically Signed   By: Marijo Conception, M.D.   On: 08/31/2016 22:29   Dg Pelvis Portable  Result Date: 08/14/2016 CLINICAL DATA:  Post-op left anterior hip replacement EXAM: PORTABLE PELVIS 1-2 VIEWS COMPARISON:  06/24/2016, 06/08/2016 FINDINGS: Patient has undergone left hip hemiarthroplasty. The femoral head component appears well seated in the acetabulum on the frontal views performed. Surgical clips overlie the left hip. Postoperative gas is present. Degenerative changes are noted in the right hip. Degenerative changes are seen in the spine. IMPRESSION: Postoperative appearance of left hip hemiarthroplasty. No adverse features. Electronically Signed   By: Nolon Nations M.D.   On: 08/14/2016 14:35   Dg Chest Port 1 View  Result Date: 09/08/2016 CLINICAL DATA:  Chest pain x3 days and dyspnea EXAM: PORTABLE CHEST 1 VIEW COMPARISON:  08/13/2016 FINDINGS: Stable cardiomegaly with aortic atherosclerosis. Clear lungs. Scoliotic curvature of the lower thoracic spine convex to the left is stable appearance. IMPRESSION: 1. Cardiomegaly with aortic atherosclerosis. 2. No acute pulmonary disease. 3. Levoscoliosis of the lower thoracic spine, stable in appearance. Electronically Signed   By: Ashley Royalty M.D.   On: 09/08/2016 20:55   Dg Abdomen Acute W/chest  Result Date: 08/31/2016 CLINICAL DATA:  Pain. EXAM: DG ABDOMEN ACUTE W/ 1V CHEST COMPARISON:  None. FINDINGS: There is no evidence of dilated bowel loops or free intraperitoneal  air. No radiopaque calculi or other significant radiographic abnormality is seen. Heart size and mediastinal contours are within normal limits. Both lungs are clear. IMPRESSION: Negative abdominal radiographs.  No acute cardiopulmonary disease. Electronically Signed   By: Dorise Bullion III M.D   On: 08/31/2016 19:32   Dg Hip Operative Unilat W Or W/o Pelvis Left  Result Date: 08/14/2016 CLINICAL DATA:  Intra-op left anterior hip replacement, fluoro time 12 sec EXAM: OPERATIVE LEFT HIP (WITH PELVIS IF PERFORMED)  VIEWS TECHNIQUE: Fluoroscopic spot image(s) were submitted for interpretation post-operatively. COMPARISON:  None. FINDINGS: Four intraoperative fluoroscopic spot images are provided showing placement of left hip arthroplasty hardware. Final images show the hardware to be appropriately positioned. Adjacent osseous structures appear anatomic in configuration. Fluoroscopy provided for 12 seconds.  IMPRESSION: Intraoperative fluoroscopic spot images demonstrating left hip arthroplasty. Hardware appears appropriately positioned. No evidence of surgical complicating feature. Electronically Signed   By: Franki Cabot M.D.   On: 08/14/2016 13:34   Dg Hip Unilat W Or Wo Pelvis 2-3 Views Left  Result Date: 08/13/2016 CLINICAL DATA:  Status post fall 2 weeks ago.  Left hip fracture. EXAM: DG HIP (WITH OR WITHOUT PELVIS) 2-3V LEFT COMPARISON:  CT left hip 06/08/2017. FINDINGS: The patient has a subcapital left hip fracture. Some fracture margins appear somewhat corticated suggesting subacute injury. The femoral head remnant is somewhat flattened and sclerotic compatible with necrosis. No other fracture is identified. The right hip is located. IMPRESSION: Subcapital left hip fracture could be acute but has an appearance suggestive of subacute injury. Flattening and sclerosis of the left femoral head is compatible with avascular necrosis. Electronically Signed   By: Inge Rise M.D.   On: 08/13/2016 18:41    Ct Angio Abd/pel W And/or Wo Contrast  Result Date: 09/08/2016 CLINICAL DATA:  Lower abdominal pain with constipation for 3 days EXAM: CTA ABDOMEN AND PELVIS wITHOUT AND WITH CONTRAST TECHNIQUE: Multidetector CT imaging of the abdomen and pelvis was performed using the standard protocol during bolus administration of intravenous contrast. Multiplanar reconstructed images and MIPs were obtained and reviewed to evaluate the vascular anatomy. CONTRAST:  100 mL Isovue 370 intravenous COMPARISON:  Radiograph 821 1,018 FINDINGS: VASCULAR Aorta: No aneurysmal dilatation. Atherosclerotic calcifications. No dissection is seen. Celiac: Calcification at the origin with mild narrowing. Distal vessel branch vessels are patent SMA: Small amount of calcification at the origin. No significant stenosis. No filling defects Renals: Single right and single left renal artery. Mild narrowing at the origin of left renal artery. Normal right renal artery. IMA: Patent without evidence of aneurysm, dissection, vasculitis or significant stenosis. Inflow: Atherosclerotic calcifications. No hemodynamically significant stenosis or occlusive disease. Proximal Outflow: Bilateral common femoral and visualized portions of the superficial and profunda femoral arteries are patent without evidence of aneurysm, dissection, vasculitis or significant stenosis. Veins: No obvious venous abnormality within the limitations of this arterial phase study. Review of the MIP images confirms the above findings. NON-VASCULAR Lower chest: Calcified granuloma in the right lower lobe. No consolidation or effusion. Mitral calcification. Normal heart size. Hepatobiliary: No focal liver abnormality is seen. No gallstones, gallbladder wall thickening, or biliary dilatation. Pancreas: Unremarkable. No pancreatic ductal dilatation or surrounding inflammatory changes. Spleen: Normal in size without focal abnormality. Adrenals/Urinary Tract: Adrenal glands are  unremarkable. Kidneys are normal, without renal calculi, focal lesion, or hydronephrosis. Bladder is unremarkable. Prominent extrarenal pelvis bilaterally. Stomach/Bowel: Questionable wall thickening of the distal stomach with mild mucosal enhancement. No dilated small bowel. Diffuse fluid-filled colon. Moderate stool in the left colon. Moderate feces retention in the rectum. No wall thickening or intramural air. Nonvisualized appendix Lymphatic: No significantly enlarged abdominal or pelvic lymph nodes Reproductive: Status post hysterectomy. No adnexal masses. Other: Negative for free air or free fluid Musculoskeletal: Scoliosis and degenerative changes. No acute or suspicious bone lesion. Status post left hip replacement with artifact IMPRESSION: VASCULAR 1. Scattered atherosclerotic calcifications of the aorta and branch vessels but no evidence for hemodynamically significant stenosis or occlusive disease. NON-VASCULAR 1. Fluid-filled right and transverse colon with moderate stool in the left colon and moderate feces retention in the rectum. Negative for bowel obstruction or colon wall thickening. Negative for intramural air, mesenteric venous gas, or free air 2. Suggested mild wall thickening of the distal stomach with prominent mucosal  enhancement, query gastritis Electronically Signed   By: Donavan Foil M.D.   On: 09/08/2016 22:47

## 2016-09-08 NOTE — ED Notes (Signed)
Patient transported to CT 

## 2016-09-08 NOTE — ED Provider Notes (Signed)
[facility] Emergency Department Provider Note  ____________________________________________   First MD Initiated Contact with Patient 09/08/16 2027     (approximate)  I have reviewed the triage vital signs and the nursing notes.   HISTORY  Chief Complaint Abdominal Pain and Constipation  Level V exemption history Limited by the patient's dementia  HPI Misty Lucas is a 83 y.o. female who comes to the emergency department via EMS with 3 days of constipation and mild to moderate diffuse abdominal discomfort. She does have a past medical history of atrial fibrillation as well as the past surgical history of appendicitis in the past.   Past Medical History:  Diagnosis Date  . Arthritis   . Blood clot embolism during pregnancy, antepartum 1957   left thigh  . Fibrocystic breast    left breast  . Gastritis   . History of arthroscopy of knee 1980's   right  . Hyperlipemia   . Lumbar stenosis    2 buldging disc  . Myocardial infarction [facility]) 2008    Patient Active Problem List   Diagnosis Date Noted  . Closed left hip fracture ([place]) 08/13/2016    Past Surgical History:  Procedure Laterality Date  . ABDOMINAL HYSTERECTOMY    . APPENDECTOMY    . BREAST SURGERY    . CARPAL TUNNEL RELEASE Bilateral 2007  . GUM SURGERY  1980's  . HIP ARTHROPLASTY Left 08/14/2016   Procedure: ARTHROPLASTY BIPOLAR HIP (HEMIARTHROPLASTY);  Surgeon: Lovell Sheehan, MD;  Location: [facility] ORS;  Service: Orthopedics;  Laterality: Left;  . KNEE ARTHROSCOPY Bilateral 1980's    Prior to Admission medications   Medication Sig Start Date End Date Taking? Authorizing Provider  acetaminophen (TYLENOL) 500 MG tablet Take 1,000 mg by mouth 3 (three) times daily.     [provider]  Ascorbic Acid (VITAMIN C WITH ROSE HIPS) 500 MG tablet Take 500 mg by mouth every morning.    [provider]  aspirin EC 325 MG EC tablet Take 1 tablet (325 mg total) by mouth  daily with breakfast. 08/17/16   Fritzi Mandes, MD  Calcium Carb-Cholecalciferol (OS-CAL CALCIUM + D3) 500-200 MG-UNIT TABS Take 1 tablet by mouth 2 (two) times daily.     [provider]  diclofenac (VOLTAREN) 50 MG EC tablet Take 50 mg by mouth 2 (two) times daily.    [provider]  docusate sodium (COLACE) 100 MG capsule Take 200 mg by mouth at bedtime.     [provider]  metoprolol tartrate (LOPRESSOR) 25 MG tablet Take 1 tablet (25 mg total) by mouth 2 (two) times daily. 08/16/16   Fritzi Mandes, MD  Multiple Vitamins-Minerals (CENTRUM SILVER ULTRA WOMENS PO) Take 1 tablet by mouth every morning.    [provider]  oxyCODONE (OXY IR/ROXICODONE) 5 MG immediate release tablet Take 1 tablet (5 mg total) by mouth every 6 (six) hours as needed for breakthrough pain. 08/16/16   Fritzi Mandes, MD  senna (SENOKOT) 8.6 MG TABS tablet Take 1 tablet by mouth daily as needed for mild constipation.    [provider]    Allergies Carafate [sucralfate]; Lipitor [atorvastatin]; Mobic [meloxicam]; Penicillins; and Tramadol  Family History  Problem Relation Age of Onset  . Heart attack Mother   . Tuberculosis Father   . Dementia Father     Social History Social History  Substance Use Topics  . Smoking status: Former Smoker    Types: Cigarettes    Quit date: 03/26/1967  .  Smokeless tobacco: Never Used  . Alcohol use No    Review of Systems {*level V exemption history Limited by the patient's dementia  ____________________________________________   PHYSICAL EXAM:  VITAL SIGNS: ED Triage Vitals  Enc Vitals Group     BP      Pulse      Resp      Temp      Temp src      SpO2      Weight      Height      Head Circumference      Peak Flow      Pain Score      Pain Loc      Pain Edu?      Excl. in [place]?     Constitutional: pleasant cooperative knows her name but not the date or where she is no distress Eyes: PERRL EOMI. Head:  Atraumatic. Nose: No congestion/rhinnorhea. Mouth/Throat: No trismus Neck: No stridor.   Cardiovascular: Normal rate, regular rhythm. Grossly normal heart sounds.  Good peripheral circulation. Respiratory: Normal respiratory effort.  No retractions. Lungs CTAB and moving good air Gastrointestinal: soft mild diffuse tenderness without focality no rebound or guarding no peritonitis Musculoskeletal: No lower extremity edema   Neurologic:  Normal speech and language. No gross focal neurologic deficits are appreciated. Skin:  Skin is warm, dry and intact. No rash noted. Psychiatric: severe dementia   ____________________________________________   DIFFERENTIAL includes but not limited to  Mesenteric ischemia, colitis, diverticulitis, volvulus, constipation ____________________________________________   LABS (all labs ordered are listed, but only abnormal results are displayed)  Labs Reviewed  BASIC METABOLIC PANEL - Abnormal; Notable for the following:       Result Value   Sodium 131 (*)    Chloride 97 (*)    Glucose, Bld 144 (*)    All other components within normal limits  HEPATIC FUNCTION PANEL - Abnormal; Notable for the following:    Albumin 3.0 (*)    Bilirubin, Direct <0.1 (*)    All other components within normal limits  TROPONIN I - Abnormal; Notable for the following:    Troponin I 0.03 (*)    All other components within normal limits  CBC WITH DIFFERENTIAL/PLATELET - Abnormal; Notable for the following:    WBC 12.3 (*)    RBC 3.79 (*)    Hemoglobin 11.5 (*)    HCT 33.7 (*)    Platelets 604 (*)    Neutro Abs 10.5 (*)    Lymphs Abs 0.6 (*)    Monocytes Absolute 1.1 (*)    All other components within normal limits  LIPASE, BLOOD  LACTIC ACID, PLASMA  URINALYSIS, COMPLETE (UACMP) WITH MICROSCOPIC    Elevated troponin of unclear clinical significance normal lactate __________________________________________  EKG  ED ECG REPORT I, Darel Hong, the attending  physician, personally viewed and interpreted this ECG.  Date: 09/08/2016 Rate: 91 Rhythm: normal sinus rhythm QRS Axis: normal Intervals: normal ST/T Wave abnormalities: normal Narrative Interpretation: normal sinus rhythm at 91 normal intervals sinus arrhythmia and no blocks incomplete right bundle branch block with lateral T-wave inversions unchanged from previous EKG days ago. She does have large voltage and this could be normal repolarization abnormalities. No signs of acute ischemia. __________________________________________  RADIOLOGY  CT angiogram of the abdomen and pelvis with no acute disease aside from fecal impaction ____________________________________________   PROCEDURES  Procedure(s) performed: yes  ------------------------------------------------------------------------------------------------------------------- Fecal Disimpaction Procedure Note:  Performed by me:  Patient placed in the lateral  recumbent position with knees drawn towards chest. Nurse present for patient support. Large amount of hard brown stool removed. No complications during procedure.   ------------------------------------------------------------------------------------------------------------------    Procedures  Critical Care performed: no  Observation: no ____________________________________________   INITIAL IMPRESSION / ASSESSMENT AND PLAN / ED COURSE  Pertinent labs & imaging results that were available during my care of the patient were reviewed by me and considered in my medical decision making (see chart for details).  Exam is difficult on the severely demented patient. Given her history of atrial fibrillation and diffuse abdominal pain I believe she requires a CT angiogram of her abdomen pelvis and not simply regular CT scan.    ----------------------------------------- 11:09 PM on 09/08/2016 -----------------------------------------  The patient's CT scan is negative  for acute pathology aside from fecal impaction. I performed a disimpaction removing copious amounts of hard stool. The patient will get a soapsuds enema following and then be discharged home.  ____________________________________________   FINAL CLINICAL IMPRESSION(S) / ED DIAGNOSES  Final diagnoses:  Constipation, unspecified constipation type  Fecal impaction ([place])      NEW MEDICATIONS STARTED DURING THIS VISIT:  New Prescriptions   No medications on file     Note:  This document was prepared using Dragon voice recognition software and may include unintentional dictation errors.     Darel Hong, MD 09/08/16 2310

## 2016-09-09 NOTE — ED Notes (Signed)
Pt. Assisted to toilet, pt. Had large bowel movement post soap suds enema.

## 2016-09-09 NOTE — ED Notes (Signed)
Enema given per MD order, pt tolerated well.

## 2016-09-16 ENCOUNTER — Inpatient Hospital Stay: Payer: Medicare Other

## 2016-09-16 ENCOUNTER — Inpatient Hospital Stay: Payer: Medicare Other | Admitting: Anesthesiology

## 2016-09-16 ENCOUNTER — Emergency Department: Payer: Medicare Other

## 2016-09-16 ENCOUNTER — Inpatient Hospital Stay
Admission: EM | Admit: 2016-09-16 | Discharge: 2016-09-30 | DRG: 853 | Disposition: A | Discharge status: Skilled Nursing Facility | Payer: Medicare Other | Attending: Surgery | Admitting: Surgery

## 2016-09-16 ENCOUNTER — Encounter
Admission: EM | Disposition: A | Discharge status: Skilled Nursing Facility | Payer: Self-pay | Source: Home / Self Care | Attending: Surgery

## 2016-09-16 ENCOUNTER — Encounter: Payer: Self-pay | Admitting: Emergency Medicine

## 2016-09-16 DIAGNOSIS — K316 Fistula of stomach and duodenum: Secondary | ICD-10-CM | POA: Diagnosis present

## 2016-09-16 DIAGNOSIS — Z96642 Presence of left artificial hip joint: Secondary | ICD-10-CM | POA: Diagnosis present

## 2016-09-16 DIAGNOSIS — Z515 Encounter for palliative care: Secondary | ICD-10-CM

## 2016-09-16 DIAGNOSIS — Z87891 Personal history of nicotine dependence: Secondary | ICD-10-CM

## 2016-09-16 DIAGNOSIS — K651 Peritoneal abscess: Secondary | ICD-10-CM | POA: Diagnosis present

## 2016-09-16 DIAGNOSIS — Z7189 Other specified counseling: Secondary | ICD-10-CM

## 2016-09-16 DIAGNOSIS — I10 Essential (primary) hypertension: Secondary | ICD-10-CM | POA: Diagnosis present

## 2016-09-16 DIAGNOSIS — K631 Perforation of intestine (nontraumatic): Secondary | ICD-10-CM

## 2016-09-16 DIAGNOSIS — D473 Essential (hemorrhagic) thrombocythemia: Secondary | ICD-10-CM | POA: Diagnosis not present

## 2016-09-16 DIAGNOSIS — K5641 Fecal impaction: Secondary | ICD-10-CM | POA: Diagnosis present

## 2016-09-16 DIAGNOSIS — G309 Alzheimer's disease, unspecified: Secondary | ICD-10-CM | POA: Diagnosis present

## 2016-09-16 DIAGNOSIS — Z885 Allergy status to narcotic agent status: Secondary | ICD-10-CM

## 2016-09-16 DIAGNOSIS — B9620 Unspecified Escherichia coli [E. coli] as the cause of diseases classified elsewhere: Secondary | ICD-10-CM | POA: Diagnosis present

## 2016-09-16 DIAGNOSIS — Z681 Body mass index (BMI) 19 or less, adult: Secondary | ICD-10-CM

## 2016-09-16 DIAGNOSIS — F0390 Unspecified dementia without behavioral disturbance: Secondary | ICD-10-CM | POA: Diagnosis not present

## 2016-09-16 DIAGNOSIS — Z79899 Other long term (current) drug therapy: Secondary | ICD-10-CM | POA: Diagnosis not present

## 2016-09-16 DIAGNOSIS — E43 Unspecified severe protein-calorie malnutrition: Secondary | ICD-10-CM | POA: Diagnosis present

## 2016-09-16 DIAGNOSIS — Z9889 Other specified postprocedural states: Secondary | ICD-10-CM | POA: Diagnosis not present

## 2016-09-16 DIAGNOSIS — E876 Hypokalemia: Secondary | ICD-10-CM | POA: Diagnosis not present

## 2016-09-16 DIAGNOSIS — R000 Tachycardia, unspecified: Secondary | ICD-10-CM | POA: Diagnosis present

## 2016-09-16 DIAGNOSIS — R778 Other specified abnormalities of plasma proteins: Secondary | ICD-10-CM

## 2016-09-16 DIAGNOSIS — K5900 Constipation, unspecified: Secondary | ICD-10-CM

## 2016-09-16 DIAGNOSIS — N390 Urinary tract infection, site not specified: Secondary | ICD-10-CM | POA: Diagnosis present

## 2016-09-16 DIAGNOSIS — D649 Anemia, unspecified: Secondary | ICD-10-CM | POA: Diagnosis not present

## 2016-09-16 DIAGNOSIS — F329 Major depressive disorder, single episode, unspecified: Secondary | ICD-10-CM | POA: Diagnosis present

## 2016-09-16 DIAGNOSIS — Z66 Do not resuscitate: Secondary | ICD-10-CM | POA: Diagnosis present

## 2016-09-16 DIAGNOSIS — A419 Sepsis, unspecified organism: Principal | ICD-10-CM

## 2016-09-16 DIAGNOSIS — Z880 Allergy status to penicillin: Secondary | ICD-10-CM

## 2016-09-16 DIAGNOSIS — K265 Chronic or unspecified duodenal ulcer with perforation: Secondary | ICD-10-CM | POA: Diagnosis not present

## 2016-09-16 DIAGNOSIS — R748 Abnormal levels of other serum enzymes: Secondary | ICD-10-CM | POA: Diagnosis not present

## 2016-09-16 DIAGNOSIS — A0472 Enterocolitis due to Clostridium difficile, not specified as recurrent: Secondary | ICD-10-CM | POA: Diagnosis present

## 2016-09-16 DIAGNOSIS — Z86718 Personal history of other venous thrombosis and embolism: Secondary | ICD-10-CM

## 2016-09-16 DIAGNOSIS — F0280 Dementia in other diseases classified elsewhere without behavioral disturbance: Secondary | ICD-10-CM | POA: Diagnosis present

## 2016-09-16 DIAGNOSIS — I2510 Atherosclerotic heart disease of native coronary artery without angina pectoris: Secondary | ICD-10-CM | POA: Diagnosis present

## 2016-09-16 DIAGNOSIS — M1990 Unspecified osteoarthritis, unspecified site: Secondary | ICD-10-CM | POA: Diagnosis not present

## 2016-09-16 DIAGNOSIS — Z7982 Long term (current) use of aspirin: Secondary | ICD-10-CM

## 2016-09-16 DIAGNOSIS — K219 Gastro-esophageal reflux disease without esophagitis: Secondary | ICD-10-CM | POA: Diagnosis present

## 2016-09-16 DIAGNOSIS — K3189 Other diseases of stomach and duodenum: Secondary | ICD-10-CM | POA: Diagnosis not present

## 2016-09-16 DIAGNOSIS — I252 Old myocardial infarction: Secondary | ICD-10-CM

## 2016-09-16 DIAGNOSIS — N179 Acute kidney failure, unspecified: Secondary | ICD-10-CM | POA: Diagnosis present

## 2016-09-16 DIAGNOSIS — E785 Hyperlipidemia, unspecified: Secondary | ICD-10-CM | POA: Diagnosis not present

## 2016-09-16 DIAGNOSIS — Z888 Allergy status to other drugs, medicaments and biological substances status: Secondary | ICD-10-CM

## 2016-09-16 DIAGNOSIS — F05 Delirium due to known physiological condition: Secondary | ICD-10-CM | POA: Diagnosis not present

## 2016-09-16 DIAGNOSIS — M48061 Spinal stenosis, lumbar region without neurogenic claudication: Secondary | ICD-10-CM | POA: Diagnosis present

## 2016-09-16 DIAGNOSIS — R7989 Other specified abnormal findings of blood chemistry: Secondary | ICD-10-CM

## 2016-09-16 DIAGNOSIS — R1084 Generalized abdominal pain: Secondary | ICD-10-CM

## 2016-09-16 DIAGNOSIS — N3000 Acute cystitis without hematuria: Secondary | ICD-10-CM

## 2016-09-16 HISTORY — PX: LAPAROTOMY: SHX154

## 2016-09-16 LAB — COMPREHENSIVE METABOLIC PANEL
ALK PHOS: 83 U/L (ref 38–126)
ALT: 21 U/L (ref 14–54)
AST: 29 U/L (ref 15–41)
Albumin: 3.2 g/dL — ABNORMAL LOW (ref 3.5–5.0)
Anion gap: 12 (ref 5–15)
BILIRUBIN TOTAL: 0.6 mg/dL (ref 0.3–1.2)
BUN: 15 mg/dL (ref 6–20)
CALCIUM: 9.2 mg/dL (ref 8.9–10.3)
CO2: 29 mmol/L (ref 22–32)
Chloride: 93 mmol/L — ABNORMAL LOW (ref 101–111)
Creatinine, Ser: 0.81 mg/dL (ref 0.44–1.00)
Glucose, Bld: 171 mg/dL — ABNORMAL HIGH (ref 65–99)
Potassium: 4.4 mmol/L (ref 3.5–5.1)
Sodium: 134 mmol/L — ABNORMAL LOW (ref 135–145)
TOTAL PROTEIN: 8.2 g/dL — AB (ref 6.5–8.1)

## 2016-09-16 LAB — CBC WITH DIFFERENTIAL/PLATELET
Basophils Absolute: 0.1 10*3/uL (ref 0–0.1)
Basophils Relative: 0 %
Eosinophils Absolute: 0 10*3/uL (ref 0–0.7)
Eosinophils Relative: 0 %
HCT: 38.2 % (ref 35.0–47.0)
Hemoglobin: 13.2 g/dL (ref 12.0–16.0)
Lymphocytes Relative: 8 %
Lymphs Abs: 1 10*3/uL (ref 1.0–3.6)
MCH: 29.8 pg (ref 26.0–34.0)
MCHC: 34.6 g/dL (ref 32.0–36.0)
MCV: 86.2 fL (ref 80.0–100.0)
Monocytes Absolute: 0.9 10*3/uL (ref 0.2–0.9)
Monocytes Relative: 7 %
Neutro Abs: 11 10*3/uL — ABNORMAL HIGH (ref 1.4–6.5)
Neutrophils Relative %: 85 %
Platelets: 875 10*3/uL — ABNORMAL HIGH (ref 150–440)
RBC: 4.43 MIL/uL (ref 3.80–5.20)
RDW: 13.7 % (ref 11.5–14.5)
WBC: 13 10*3/uL — ABNORMAL HIGH (ref 3.6–11.0)

## 2016-09-16 LAB — URINALYSIS, COMPLETE (UACMP) WITH MICROSCOPIC
Bacteria, UA: NONE SEEN
Bilirubin Urine: NEGATIVE
Glucose, UA: NEGATIVE mg/dL
Hgb urine dipstick: NEGATIVE
KETONES UR: 5 mg/dL — AB
Nitrite: POSITIVE — AB
PH: 7 (ref 5.0–8.0)
PROTEIN: 30 mg/dL — AB
Specific Gravity, Urine: 1.013 (ref 1.005–1.030)
Squamous Epithelial / LPF: NONE SEEN

## 2016-09-16 LAB — GLUCOSE, CAPILLARY: Glucose-Capillary: 158 mg/dL — ABNORMAL HIGH (ref 65–99)

## 2016-09-16 LAB — TROPONIN I
TROPONIN I: 0.05 ng/mL — AB (ref <0.03)
TROPONIN I: 0.07 ng/mL — AB (ref <0.03)

## 2016-09-16 LAB — LIPASE, BLOOD: Lipase: 24 U/L (ref 11–51)

## 2016-09-16 LAB — LACTIC ACID, PLASMA: Lactic Acid, Venous: 2.2 mmol/L (ref 0.5–1.9)

## 2016-09-16 LAB — MRSA PCR SCREENING: MRSA BY PCR: NEGATIVE

## 2016-09-16 SURGERY — LAPAROTOMY, EXPLORATORY
Anesthesia: General

## 2016-09-16 MED ORDER — DOCUSATE SODIUM 100 MG PO CAPS: 200.0000 mg | ORAL_CAPSULE | Freq: Every day | ORAL | Status: DC

## 2016-09-16 MED ORDER — NITROGLYCERIN 2 % TD OINT
TOPICAL_OINTMENT | TRANSDERMAL | Status: DC | PRN
  Administered 2016-09-16: 1 [in_us] via TOPICAL

## 2016-09-16 MED ORDER — FENTANYL CITRATE (PF) 100 MCG/2ML IJ SOLN: 25.0000 ug | INTRAMUSCULAR | Status: DC | PRN

## 2016-09-16 MED ORDER — ONDANSETRON HCL 4 MG/2ML IJ SOLN
INTRAMUSCULAR | Status: AC
  Filled 2016-09-16: qty 2

## 2016-09-16 MED ORDER — CENTRUM SILVER ULTRA WOMENS PO TABS: ORAL_TABLET | ORAL | Status: DC

## 2016-09-16 MED ORDER — METRONIDAZOLE IN NACL 5-0.79 MG/ML-% IV SOLN
500.0000 mg | Freq: Once | INTRAVENOUS | Status: AC
  Administered 2016-09-16: 500 mg via INTRAVENOUS
  Filled 2016-09-16: qty 100

## 2016-09-16 MED ORDER — DEXAMETHASONE SODIUM PHOSPHATE 10 MG/ML IJ SOLN
INTRAMUSCULAR | Status: AC
  Filled 2016-09-16: qty 1

## 2016-09-16 MED ORDER — SENNA 8.6 MG PO TABS: 1.0000 | ORAL_TABLET | Freq: Two times a day (BID) | ORAL | Status: DC

## 2016-09-16 MED ORDER — ETOMIDATE 2 MG/ML IV SOLN
INTRAVENOUS | Status: AC
  Filled 2016-09-16: qty 10

## 2016-09-16 MED ORDER — HYDROCHLOROTHIAZIDE 25 MG PO TABS: 25.0000 mg | ORAL_TABLET | Freq: Every day | ORAL | Status: DC

## 2016-09-16 MED ORDER — BUPIVACAINE HCL (PF) 0.5 % IJ SOLN
INTRAMUSCULAR | Status: DC | PRN
  Administered 2016-09-16: 30 mL

## 2016-09-16 MED ORDER — SODIUM CHLORIDE 0.9 % IV SOLN
INTRAVENOUS | Status: DC | PRN
  Administered 2016-09-16 (×2): via INTRAVENOUS

## 2016-09-16 MED ORDER — MORPHINE SULFATE (PF) 2 MG/ML IV SOLN
2.0000 mg | Freq: Once | INTRAVENOUS | Status: AC
Start: 2016-09-16 — End: 2016-09-16
  Administered 2016-09-16: 2 mg via INTRAVENOUS

## 2016-09-16 MED ORDER — PIPERACILLIN-TAZOBACTAM 3.375 G IVPB 30 MIN: 3.3750 g | INTRAVENOUS | Status: DC

## 2016-09-16 MED ORDER — LACTATED RINGERS IV SOLN: INTRAVENOUS | Status: DC | PRN

## 2016-09-16 MED ORDER — ROCURONIUM BROMIDE 50 MG/5ML IV SOLN
INTRAVENOUS | Status: AC
  Filled 2016-09-16: qty 1

## 2016-09-16 MED ORDER — ENOXAPARIN SODIUM 40 MG/0.4ML ~~LOC~~ SOLN
40.0000 mg | SUBCUTANEOUS | Status: DC
  Administered 2016-09-17: 40 mg via SUBCUTANEOUS
  Filled 2016-09-16: qty 0.4

## 2016-09-16 MED ORDER — FENTANYL CITRATE (PF) 250 MCG/5ML IJ SOLN
INTRAMUSCULAR | Status: AC
  Filled 2016-09-16: qty 5

## 2016-09-16 MED ORDER — SENNOSIDES-DOCUSATE SODIUM 8.6-50 MG PO TABS: 1.0000 | ORAL_TABLET | Freq: Every evening | ORAL | Status: DC | PRN

## 2016-09-16 MED ORDER — SODIUM CHLORIDE 0.9 % IV BOLUS (SEPSIS)
1000.0000 mL | Freq: Once | INTRAVENOUS | Status: AC
  Administered 2016-09-16: 1000 mL via INTRAVENOUS

## 2016-09-16 MED ORDER — METOPROLOL TARTRATE 25 MG PO TABS: 25.0000 mg | ORAL_TABLET | Freq: Two times a day (BID) | ORAL | Status: DC

## 2016-09-16 MED ORDER — ACETAMINOPHEN 500 MG PO TABS: 1000.0000 mg | ORAL_TABLET | Freq: Three times a day (TID) | ORAL | Status: DC

## 2016-09-16 MED ORDER — LIDOCAINE HCL (PF) 2 % IJ SOLN
INTRAMUSCULAR | Status: AC
  Filled 2016-09-16: qty 2

## 2016-09-16 MED ORDER — SENNA 8.6 MG PO TABS: 1.0000 | ORAL_TABLET | Freq: Every day | ORAL | Status: DC

## 2016-09-16 MED ORDER — DICLOFENAC SODIUM 50 MG PO TBEC: 50.0000 mg | DELAYED_RELEASE_TABLET | Freq: Two times a day (BID) | ORAL | Status: DC

## 2016-09-16 MED ORDER — SODIUM CHLORIDE 0.9 % IV SOLN
Freq: Once | INTRAVENOUS | Status: AC
  Administered 2016-09-16: 12:00:00 via INTRAVENOUS

## 2016-09-16 MED ORDER — FENTANYL CITRATE (PF) 100 MCG/2ML IJ SOLN
INTRAMUSCULAR | Status: DC | PRN
  Administered 2016-09-16: 50 ug via INTRAVENOUS
  Administered 2016-09-16 (×2): 25 ug via INTRAVENOUS
  Administered 2016-09-16: 50 ug via INTRAVENOUS

## 2016-09-16 MED ORDER — SEVOFLURANE IN SOLN
RESPIRATORY_TRACT | Status: AC
  Filled 2016-09-16: qty 250

## 2016-09-16 MED ORDER — NITROGLYCERIN 2 % TD OINT
TOPICAL_OINTMENT | TRANSDERMAL | Status: AC
  Filled 2016-09-16: qty 1

## 2016-09-16 MED ORDER — SODIUM CHLORIDE 0.9 % IV SOLN
INTRAVENOUS | Status: DC | PRN
  Administered 2016-09-16: 60 mL

## 2016-09-16 MED ORDER — ONDANSETRON HCL 4 MG PO TABS: 4.0000 mg | ORAL_TABLET | Freq: Four times a day (QID) | ORAL | Status: DC | PRN

## 2016-09-16 MED ORDER — ONDANSETRON HCL 4 MG/2ML IJ SOLN: 4.0000 mg | Freq: Once | INTRAMUSCULAR | Status: DC | PRN

## 2016-09-16 MED ORDER — PANTOPRAZOLE SODIUM 40 MG IV SOLR
40.0000 mg | Freq: Two times a day (BID) | INTRAVENOUS | Status: DC
  Administered 2016-09-16 – 2016-09-30 (×28): 40 mg via INTRAVENOUS
  Filled 2016-09-16 (×28): qty 40

## 2016-09-16 MED ORDER — MORPHINE SULFATE (PF) 2 MG/ML IV SOLN
INTRAVENOUS | Status: AC
  Filled 2016-09-16: qty 1

## 2016-09-16 MED ORDER — SODIUM CHLORIDE 0.9 % IV SOLN
INTRAVENOUS | Status: DC | PRN
  Administered 2016-09-16 (×2): via INTRAVENOUS

## 2016-09-16 MED ORDER — LACTATED RINGERS IV SOLN
INTRAVENOUS | Status: DC
  Administered 2016-09-16 – 2016-09-19 (×6): via INTRAVENOUS

## 2016-09-16 MED ORDER — ETOMIDATE 2 MG/ML IV SOLN
INTRAVENOUS | Status: DC | PRN
  Administered 2016-09-16: 10 mg via INTRAVENOUS

## 2016-09-16 MED ORDER — PIPERACILLIN-TAZOBACTAM 3.375 G IVPB
3.3750 g | Freq: Three times a day (TID) | INTRAVENOUS | Status: DC
  Administered 2016-09-16 – 2016-09-17 (×2): 3.375 g via INTRAVENOUS
  Filled 2016-09-16 (×2): qty 50

## 2016-09-16 MED ORDER — OXYCODONE HCL 5 MG PO TABS: 5.0000 mg | ORAL_TABLET | Freq: Four times a day (QID) | ORAL | Status: DC | PRN

## 2016-09-16 MED ORDER — LIDOCAINE HCL (CARDIAC) 20 MG/ML IV SOLN
INTRAVENOUS | Status: DC | PRN
  Administered 2016-09-16: 40 mg via INTRAVENOUS

## 2016-09-16 MED ORDER — NITROGLYCERIN 2 % TD OINT
1.0000 [in_us] | TOPICAL_OINTMENT | Freq: Once | TRANSDERMAL | Status: AC
  Administered 2016-09-16: 1 [in_us] via TOPICAL
  Filled 2016-09-16: qty 1

## 2016-09-16 MED ORDER — ONDANSETRON HCL 4 MG/2ML IJ SOLN
4.0000 mg | Freq: Four times a day (QID) | INTRAMUSCULAR | Status: DC | PRN
  Administered 2016-09-19: 4 mg via INTRAVENOUS
  Filled 2016-09-16: qty 2

## 2016-09-16 MED ORDER — PROPOFOL 10 MG/ML IV BOLUS
INTRAVENOUS | Status: AC
  Filled 2016-09-16: qty 20

## 2016-09-16 MED ORDER — DEXTROSE 5 % IV SOLN: 1.0000 g | INTRAVENOUS | Status: DC

## 2016-09-16 MED ORDER — ACETAMINOPHEN 10 MG/ML IV SOLN
INTRAVENOUS | Status: DC | PRN
  Administered 2016-09-16: 1000 mg via INTRAVENOUS

## 2016-09-16 MED ORDER — ASPIRIN EC 325 MG PO TBEC: 325.0000 mg | DELAYED_RELEASE_TABLET | Freq: Every day | ORAL | Status: DC

## 2016-09-16 MED ORDER — LIDOCAINE VISCOUS 2 % MT SOLN
15.0000 mL | Freq: Once | OROMUCOSAL | Status: AC
  Administered 2016-09-16: 15 mL via OROMUCOSAL
  Filled 2016-09-16: qty 15

## 2016-09-16 MED ORDER — HYDRALAZINE HCL 20 MG/ML IJ SOLN: 10.0000 mg | Freq: Four times a day (QID) | INTRAMUSCULAR | Status: DC | PRN

## 2016-09-16 MED ORDER — ASPIRIN 81 MG PO CHEW
324.0000 mg | CHEWABLE_TABLET | Freq: Once | ORAL | Status: AC
  Administered 2016-09-16: 324 mg via ORAL
  Filled 2016-09-16: qty 4

## 2016-09-16 MED ORDER — VASOPRESSIN 20 UNIT/ML IV SOLN
INTRAVENOUS | Status: DC | PRN
  Administered 2016-09-16: 1 [IU] via INTRAVENOUS
  Administered 2016-09-16 (×2): .5 [IU] via INTRAVENOUS
  Administered 2016-09-16: 1 [IU] via INTRAVENOUS
  Administered 2016-09-16: .5 [IU] via INTRAVENOUS
  Administered 2016-09-16: 1 [IU] via INTRAVENOUS
  Administered 2016-09-16: .5 [IU] via INTRAVENOUS

## 2016-09-16 MED ORDER — METOPROLOL TARTRATE 5 MG/5ML IV SOLN
INTRAVENOUS | Status: DC | PRN
  Administered 2016-09-16 (×5): 1 mg via INTRAVENOUS

## 2016-09-16 MED ORDER — ONDANSETRON HCL 4 MG/2ML IJ SOLN
INTRAMUSCULAR | Status: DC | PRN
  Administered 2016-09-16: 4 mg via INTRAVENOUS

## 2016-09-16 MED ORDER — OYSTER CALCIUM 500 MG PO TABS: 500.0000 mg | ORAL_TABLET | Freq: Every day | ORAL | Status: DC

## 2016-09-16 MED ORDER — SUCCINYLCHOLINE CHLORIDE 20 MG/ML IJ SOLN
INTRAMUSCULAR | Status: AC
  Filled 2016-09-16: qty 1

## 2016-09-16 MED ORDER — CEFTRIAXONE SODIUM 1 G IJ SOLR
1.0000 g | Freq: Once | INTRAMUSCULAR | Status: AC
  Administered 2016-09-16: 1 g via INTRAVENOUS
  Filled 2016-09-16: qty 10

## 2016-09-16 MED ORDER — PHENYLEPHRINE HCL 10 MG/ML IJ SOLN
INTRAMUSCULAR | Status: DC | PRN
  Administered 2016-09-16: 100 ug via INTRAVENOUS

## 2016-09-16 MED ORDER — PHENYLEPHRINE HCL 10 MG/ML IJ SOLN
INTRAMUSCULAR | Status: DC | PRN
  Administered 2016-09-16: 25 ug/min via INTRAVENOUS

## 2016-09-16 MED ORDER — ROCURONIUM BROMIDE 100 MG/10ML IV SOLN
INTRAVENOUS | Status: DC | PRN
  Administered 2016-09-16: 40 mg via INTRAVENOUS
  Administered 2016-09-16: 20 mg via INTRAVENOUS
  Administered 2016-09-16: 10 mg via INTRAVENOUS
  Administered 2016-09-16: 20 mg via INTRAVENOUS
  Administered 2016-09-16: 10 mg via INTRAVENOUS

## 2016-09-16 MED ORDER — SERTRALINE HCL 25 MG PO TABS: 25.0000 mg | ORAL_TABLET | Freq: Every day | ORAL | Status: DC

## 2016-09-16 MED ORDER — DEXAMETHASONE SODIUM PHOSPHATE 10 MG/ML IJ SOLN
INTRAMUSCULAR | Status: DC | PRN
  Administered 2016-09-16: 10 mg via INTRAVENOUS

## 2016-09-16 MED ORDER — ACETAMINOPHEN 10 MG/ML IV SOLN
INTRAVENOUS | Status: AC
  Filled 2016-09-16: qty 100

## 2016-09-16 MED ORDER — METOPROLOL TARTRATE 5 MG/5ML IV SOLN
INTRAVENOUS | Status: AC
  Filled 2016-09-16: qty 5

## 2016-09-16 MED ORDER — IOPAMIDOL (ISOVUE-370) INJECTION 76%
80.0000 mL | Freq: Once | INTRAVENOUS | Status: AC | PRN
  Administered 2016-09-16: 80 mL via INTRAVENOUS

## 2016-09-16 SURGICAL SUPPLY — 48 items
BAG BILE T-TUBES STRL (MISCELLANEOUS) ×3 IMPLANT
BAG URINE DRAINAGE (UROLOGICAL SUPPLIES) ×3 IMPLANT
BULB RESERV EVAC DRAIN JP 100C (MISCELLANEOUS) ×6 IMPLANT
CANISTER SUCT 1200ML W/VALVE (MISCELLANEOUS) ×3 IMPLANT
CATH T TUBE 20FR (CATHETERS) ×3 IMPLANT
CHLORAPREP W/TINT 10.5 ML (MISCELLANEOUS) ×3 IMPLANT
DRAIN CHANNEL JP 19F (MISCELLANEOUS) ×6 IMPLANT
DRAIN PENROSE 1/4X12 LTX (DRAIN) ×3 IMPLANT
DRAPE LAPAROTOMY T 102X78X121 (DRAPES) ×3 IMPLANT
DRSG OPSITE POSTOP 4X10 (GAUZE/BANDAGES/DRESSINGS) ×3 IMPLANT
DRSG OPSITE POSTOP 4X12 (GAUZE/BANDAGES/DRESSINGS) ×3 IMPLANT
DRSG TEGADERM 4X10 (GAUZE/BANDAGES/DRESSINGS) ×3 IMPLANT
DRSG TEGADERM 4X4.75 (GAUZE/BANDAGES/DRESSINGS) ×3 IMPLANT
ELECT CAUTERY BLADE 6.4 (BLADE) ×3 IMPLANT
ELECT REM PT RETURN 9FT ADLT (ELECTROSURGICAL) ×3
ELECTRODE REM PT RTRN 9FT ADLT (ELECTROSURGICAL) ×1 IMPLANT
GAUZE SPONGE 4X4 12PLY STRL (GAUZE/BANDAGES/DRESSINGS) ×3 IMPLANT
GLOVE SURG SYN 7.0 (GLOVE) ×6 IMPLANT
GLOVE SURG SYN 7.5  E (GLOVE) ×4
GLOVE SURG SYN 7.5 E (GLOVE) ×2 IMPLANT
GOWN STRL REUS W/ TWL LRG LVL3 (GOWN DISPOSABLE) ×4 IMPLANT
GOWN STRL REUS W/TWL LRG LVL3 (GOWN DISPOSABLE) ×8
LABEL OR SOLS (LABEL) ×3 IMPLANT
LIGASURE IMPACT 36 18CM CVD LR (INSTRUMENTS) ×3 IMPLANT
NDL SAFETY 22GX1.5 (NEEDLE) ×9 IMPLANT
NS IRRIG 1000ML POUR BTL (IV SOLUTION) ×3 IMPLANT
PACK BASIN MAJOR ARMC (MISCELLANEOUS) ×3 IMPLANT
PACK COLON CLEAN CLOSURE (MISCELLANEOUS) ×3 IMPLANT
RELOAD PROXIMATE 75MM BLUE (ENDOMECHANICALS) ×9 IMPLANT
RELOAD STAPLE SKIN SM 35W (MISCELLANEOUS) ×3 IMPLANT
SEPRAFILM MEMBRANE 5X6 (MISCELLANEOUS) ×3 IMPLANT
STAPLER PROXIMATE 75MM BLUE (STAPLE) ×3 IMPLANT
SUT ETHILON 3-0 (SUTURE) ×3 IMPLANT
SUT PDS AB 1 TP1 96 (SUTURE) ×6 IMPLANT
SUT PROLENE 0 CT 1 30 (SUTURE) ×9 IMPLANT
SUT PROLENE 2 0 SH DA (SUTURE) ×3 IMPLANT
SUT PROLENE 5 0 RB 1 DA (SUTURE) ×3 IMPLANT
SUT SILK 2 0 (SUTURE) ×2
SUT SILK 2-0 18XBRD TIE 12 (SUTURE) ×1 IMPLANT
SUT SILK 3-0 (SUTURE) ×3 IMPLANT
SUT VIC AB 3-0 SH 27 (SUTURE) ×4
SUT VIC AB 3-0 SH 27X BRD (SUTURE) ×2 IMPLANT
SYR 20CC LL (SYRINGE) ×3 IMPLANT
SYR 30ML LL (SYRINGE) ×6 IMPLANT
SYRINGE 10CC LL (SYRINGE) ×3 IMPLANT
TRAY FOLEY W/METER SILVER 16FR (SET/KITS/TRAYS/PACK) ×3 IMPLANT
TUBE MOSS GAS 18FR (TUBING) ×3 IMPLANT
TUBE T CATTELL RUB 24F (TUBING) ×3 IMPLANT

## 2016-09-16 NOTE — H&P (Signed)
Date of Admission:  09/16/2016  Reason for Admission:  Pneumoperitoneum  History of Present Illness: Misty Lucas is a 83 y.o. female who presents with abdominal pain.  She has dementia and thus the history is from her daughter in law who is at bedside.  She has been dealing with constipation issues for a long time and has been having more recently upper abdominal pain for the past week.  She had been recently seen in the emergency room and was noted to be constipated with fecal impaction. She was disimpacted and her pain at that point had subsided but now has worsened again. She was initially admitted to the medical team today with concerns for possible UTI which is positive on her urinalysis. There was a concern for possible EKG changes compared to the EKG from last week. However CT scan of the abdomen was done which showed pneumoperitoneum with possible perforated peptic ulcer.  Past Medical History: Past Medical History:  Diagnosis Date  . Arthritis   . Blood clot embolism during pregnancy, antepartum 1957   left thigh  . Fibrocystic breast    left breast  . Gastritis   . History of arthroscopy of knee 1980's   right  . Hyperlipemia   . Lumbar stenosis    2 buldging disc  . Myocardial infarction [facility]) 2008     Past Surgical History: Past Surgical History:  Procedure Laterality Date  . ABDOMINAL HYSTERECTOMY    . APPENDECTOMY    . BREAST SURGERY    . CARPAL TUNNEL RELEASE Bilateral 2007  . GUM SURGERY  1980's  . HIP ARTHROPLASTY Left 08/14/2016   Procedure: ARTHROPLASTY BIPOLAR HIP (HEMIARTHROPLASTY);  Surgeon: Lovell Sheehan, MD;  Location: [facility] ORS;  Service: Orthopedics;  Laterality: Left;  . KNEE ARTHROSCOPY Bilateral 1980's    Home Medications: Prior to Admission medications   Medication Sig Start Date End Date Taking? Authorizing Provider  acetaminophen (TYLENOL) 500 MG tablet Take 1,000 mg by mouth 3 (three) times daily.    Yes [provider]  aspirin EC  325 MG EC tablet Take 1 tablet (325 mg total) by mouth daily with breakfast. 08/17/16  Yes Fritzi Mandes, MD  diclofenac (VOLTAREN) 50 MG EC tablet Take 50 mg by mouth 2 (two) times daily.   Yes [provider]  docusate sodium (COLACE) 100 MG capsule Take 200 mg by mouth at bedtime.    Yes [provider]  hydrochlorothiazide (HYDRODIURIL) 25 MG tablet Take 25 mg by mouth daily.   Yes [provider]  metoprolol tartrate (LOPRESSOR) 25 MG tablet Take 1 tablet (25 mg total) by mouth 2 (two) times daily. 08/16/16  Yes Fritzi Mandes, MD  Multiple Vitamins-Minerals (CENTRUM SILVER ULTRA WOMENS PO) Take 1 tablet by mouth every morning.   Yes [provider]  oxyCODONE (OXY IR/ROXICODONE) 5 MG immediate release tablet Take 1 tablet (5 mg total) by mouth every 6 (six) hours as needed for breakthrough pain. 08/16/16  Yes Fritzi Mandes, MD  [place] (OYSTER CALCIUM) 500 MG TABS tablet Take 500 mg of elemental calcium by mouth daily.   Yes [provider]  senna (SENOKOT) 8.6 MG TABS tablet Take 1 tablet by mouth daily.    Yes [provider]  sertraline (ZOLOFT) 25 MG tablet Take 25 mg by mouth daily.    [provider]    Allergies: Allergies  Allergen Reactions  . Carafate [Sucralfate] Other (See Comments)    Unknown, pt does not remember  .  Lipitor [Atorvastatin] Other (See Comments)    Unknown, pt can't remember   . Mobic [Meloxicam] Other (See Comments)    Pt does not remember  . Penicillins Other (See Comments)    Pt does not remember  . Tramadol Other (See Comments)    Pt does not remember    Social History:  reports that she quit smoking about 49 years ago. Her smoking use included Cigarettes. She has never used smokeless tobacco. She reports that she does not drink alcohol or use drugs.   Family History: Family History  Problem Relation Age of Onset  . Heart attack Mother   . Tuberculosis Father   . Dementia Father      Review of Systems: Review of Systems  Constitutional: Negative for chills and fever.  HENT: Negative for hearing loss.   Respiratory: Negative for shortness of breath.   Cardiovascular: Negative for chest pain.  Gastrointestinal: Positive for abdominal pain and constipation. Negative for nausea and vomiting.  Genitourinary: Negative for dysuria.  Musculoskeletal: Negative for myalgias.  Skin: Negative for rash.  Neurological: Negative for dizziness.  Psychiatric/Behavioral: Negative for depression.    Physical Exam BP 140/76   Pulse 76   Temp 98.2 F (36.8 C) (Oral)   Resp (!) 30   Wt 46.7 kg (103 lb)   SpO2 97%   BMI 18.25 kg/m  CONSTITUTIONAL: No acute distress HEENT:  Normocephalic, atraumatic, extraocular motion intact. NECK: Trachea is midline, and there is no jugular venous distension.  RESPIRATORY:  Lungs are clear, and breath sounds are equal bilaterally. Normal respiratory effort without pathologic use of accessory muscles. CARDIOVASCULAR: Heart is regular rhythm but tachycardic, without murmurs, gallops, or rubs. GI: The abdomen is soft, distended, tender to palpation throughout. There were no palpable masses.  MUSCULOSKELETAL:  Normal muscle strength and tone in all four extremities.  No peripheral edema or cyanosis. SKIN: Skin turgor is normal. There are no pathologic skin lesions.  NEUROLOGIC:  Motor and sensation is grossly normal.  Cranial nerves are grossly intact. PSYCH:  Alert and oriented to person, place and time. Affect is normal.  Laboratory Analysis: Results for orders placed or performed during the hospital encounter of 09/16/16 (from the past 24 hour(s))  CBC with Differential     Status: Abnormal   Collection Time: 09/16/16  9:00 AM  Result Value Ref Range   WBC 13.0 (H) 3.6 - 11.0 K/uL   RBC 4.43 3.80 - 5.20 MIL/uL   Hemoglobin 13.2 12.0 - 16.0 g/dL   HCT 38.2 35.0 - 47.0 %   MCV 86.2 80.0 - 100.0 fL   MCH 29.8 26.0 - 34.0 pg   MCHC 34.6  32.0 - 36.0 g/dL   RDW 13.7 11.5 - 14.5 %   Platelets 875 (H) 150 - 440 K/uL   Neutrophils Relative % 85 %   Neutro Abs 11.0 (H) 1.4 - 6.5 K/uL   Lymphocytes Relative 8 %   Lymphs Abs 1.0 1.0 - 3.6 K/uL   Monocytes Relative 7 %   Monocytes Absolute 0.9 0.2 - 0.9 K/uL   Eosinophils Relative 0 %   Eosinophils Absolute 0.0 0 - 0.7 K/uL   Basophils Relative 0 %   Basophils Absolute 0.1 0 - 0.1 K/uL  Comprehensive metabolic panel     Status: Abnormal   Collection Time: 09/16/16  9:00 AM  Result Value Ref Range   Sodium 134 (L) 135 - 145 mmol/L   Potassium 4.4 3.5 - 5.1 mmol/L   Chloride  93 (L) 101 - 111 mmol/L   CO2 29 22 - 32 mmol/L   Glucose, Bld 171 (H) 65 - 99 mg/dL   BUN 15 6 - 20 mg/dL   Creatinine, Ser 0.81 0.44 - 1.00 mg/dL   Calcium 9.2 8.9 - 10.3 mg/dL   Total Protein 8.2 (H) 6.5 - 8.1 g/dL   Albumin 3.2 (L) 3.5 - 5.0 g/dL   AST 29 15 - 41 U/L   ALT 21 14 - 54 U/L   Alkaline Phosphatase 83 38 - 126 U/L   Total Bilirubin 0.6 0.3 - 1.2 mg/dL   GFR calc non Af Amer >60 >60 mL/min   GFR calc Af Amer >60 >60 mL/min   Anion gap 12 5 - 15  Lipase, blood     Status: None   Collection Time: 09/16/16  9:00 AM  Result Value Ref Range   Lipase 24 11 - 51 U/L  Troponin I     Status: Abnormal   Collection Time: 09/16/16  9:00 AM  Result Value Ref Range   Troponin I 0.05 (HH) <0.03 ng/mL  Urinalysis, Complete w Microscopic     Status: Abnormal   Collection Time: 09/16/16 10:23 AM  Result Value Ref Range   Color, Urine AMBER (A) YELLOW   APPearance CLOUDY (A) CLEAR   Specific Gravity, Urine 1.013 1.005 - 1.030   pH 7.0 5.0 - 8.0   Glucose, UA NEGATIVE NEGATIVE mg/dL   Hgb urine dipstick NEGATIVE NEGATIVE   Bilirubin Urine NEGATIVE NEGATIVE   Ketones, ur 5 (A) NEGATIVE mg/dL   Protein, ur 30 (A) NEGATIVE mg/dL   Nitrite POSITIVE (A) NEGATIVE   Leukocytes, UA SMALL (A) NEGATIVE   RBC / HPF 0-5 0 - 5 RBC/hpf   WBC, UA 6-30 0 - 5 WBC/hpf   Bacteria, UA NONE SEEN NONE SEEN    Squamous Epithelial / LPF NONE SEEN NONE SEEN   Amorphous Crystal PRESENT   Lactic acid, plasma     Status: Abnormal   Collection Time: 09/16/16 10:23 AM  Result Value Ref Range   Lactic Acid, Venous 2.2 (HH) 0.5 - 1.9 mmol/L    Imaging: Dg Abd 2 Views  Result Date: 09/16/2016 CLINICAL DATA:  Diffuse abdominal pain starting this morning, constipation and abdominal pain for several months but severely worse today, history gastritis, MI, former smoker EXAM: ABDOMEN - 2 VIEW COMPARISON:  08/31/2016 FINDINGS: Lung bases clear. Increased stool throughout colon. Nonobstructive bowel gas pattern. No bowel dilatation or bowel wall thickening. No free intraperitoneal air. Osseous demineralization with biconvex thoracolumbar scoliosis and note of a LEFT hip prosthesis. Scattered costal cartilaginous calcifications without definite urinary tract calcifications. IMPRESSION: Increased stool throughout colon. Otherwise negative exam. Electronically Signed   By: Lavonia Dana M.D.   On: 09/16/2016 09:36   Ct Angio Abd/pel W/ And/or W/o  Result Date: 09/16/2016 CLINICAL DATA:  83 year old female with a history of ongoing abdominal pain EXAM: CTA ABDOMEN AND PELVIS WITH CONTRAST TECHNIQUE: Multidetector CT imaging of the abdomen and pelvis was performed using the standard protocol during bolus administration of intravenous contrast. Multiplanar reconstructed images and MIPs were obtained and reviewed to evaluate the vascular anatomy. CONTRAST:  80 cc Isovue 370 COMPARISON:  None. FINDINGS: VASCULAR Aorta: Mild tortuosity of the lower thoracic and abdominal aorta. No aneurysm. No dissection flap. Scattered calcifications of the abdominal aorta. No periaortic fluid. Celiac: Moderate atherosclerotic changes at the celiac artery origin which is patent. Branch vessels are patent. SMA: Moderate atherosclerotic changes  at the origin of the superior mesenteric artery which remains patent. Renals: Single bilateral renal arteries  with mild atherosclerotic changes at the origin of the left renal artery. IMA: Inferior mesenteric artery is patent. Right lower extremity: Tortuous right-sided iliac system, with a regular beading appearance of the proximal external iliac artery, unchanged from prior. No significant stenosis. No dissection. Proximal femoral vasculature patent. Hypogastric artery patent. Left lower extremity: Tortuous left-sided iliac system. Hypogastric artery patent. Proximal femoral vasculature patent. Veins: Unremarkable appearance of the venous system. Review of the MIP images confirms the above findings. NON-VASCULAR Lower chest: Unremarkable. Hepatobiliary: Unremarkable appearance of liver parenchyma. Unremarkable gallbladder. Pancreas: Unremarkable pancreas Spleen: Unremarkable spleen Adrenals/Urinary Tract: Unremarkable adrenal glands Right: No hydronephrosis. Symmetric perfusion to the left. No nephrolithiasis. Unremarkable course of the right ureter. Left: No hydronephrosis. Symmetric perfusion to the right. No nephrolithiasis. Unremarkable course of the left ureter. Unremarkable appearance of the urinary bladder . Stomach/Bowel: Small hiatal hernia. Circumferential thickening of the very pyloric region, distal stomach, with focal gaseous outpouching on the anterior wall of the proximal duodenum compatible with perforation of ulcer. This is in direct communication to free air on the anterior surface of the liver. Relatively decompressed small bowel. Moderate to large stool burden throughout the length of the colon. Fluid filled distal colon with no significant burden of formed stool in the rectum. No transition. Appendix not identified. No significant inflammatory changes adjacent to the cecum. Lymphatic: No lymphadenopathy. Mesenteric: Significant volume of free air within the abdomen. Small foci in the hepatic hilum and adjacent to the proximal duodenum, compatible with proximal small bowel perforation. Free air layers  inferiorly along the anterior abdominal wall to the lower abdomen/ pelvis. Small amount of free fluid within the dependent abdomen. Reproductive: Hysterectomy Other: No abdominal hernia. Musculoskeletal: Avulsion fracture of the left anterior inferior iliac spine. Multilevel degenerative changes of the visualized thoracolumbar spine. S-shaped curvature, with right apex curvature of the lumbar spine centered at L3. Associated degenerative disc disease. Surgical changes of left hip arthroplasty IMPRESSION: Acute peri-pyloric/proximal duodenum ulcer perforation with free peritoneal air. There is associated inflammatory changes of proximal duodenum and distal stomach. These preliminary results were called by telephone at the time of interpretation on 09/16/2016 at 12:43 pm to Dr. Lenise Arena , who verbally acknowledged these results. Atherosclerotic changes of the abdominal aorta and mesenteric vessels, without high-grade stenosis of celiac artery, superior mesenteric artery, or inferior mesenteric artery. Tortuous bilateral iliac system. Irregular beaded appearance of the proximal right external iliac artery, potentially representing fibromuscular dysplasia, although an unusual distribution. Avulsion fracture of the left anterior inferior iliac spine, present on the comparison CT. Left hip arthroplasty. Electronically Signed   By: Corrie Mckusick D.O.   On: 09/16/2016 12:54    Assessment and Plan: This is a 83 y.o. female who presents with pneumoperitoneum, likely from a perforated peptic ulcer.  -Patient will be taken to the operating room emergently today for an exploratory laparotomy. The risks and benefits of the procedure were explained to the daughter in law as well as postoperative outcomes and expectations and she understands and are willing to proceed. She had received a dose of Rocephin in the emergency room for possible UTI, will add Flagyl as well to this for broader coverage. She will then be on IV  Zosyn per pharmacy consult. She'll otherwise have appropriate pain nausea control NG tube for suction and decompression as well as a Foley catheter for strict ins and outs. Most likely she'll also  have drains in her abdomen to drain the contaminated fluid in her abdomen. Patient will go to the operating room now.    Melvyn Neth, [place]

## 2016-09-16 NOTE — Transfer of Care (Addendum)
Immediate Anesthesia Transfer of Care Note  Patient: Misty Lucas  Procedure(s) Performed: Procedure(s): EXPLORATORY LAPAROTOMY, DISTAL GASTRECTOMY WITH OVERSEWING OF STOMACH, GASTROJEJUNOSTOMY, PLACEMENT OF DUODENAL DRAIN AND GJTUBE (N/A)  Patient Location: ICU  Anesthesia Type:General  Level of Consciousness: Patient remains intubated per anesthesia plan  Airway & Oxygen Therapy: Patient remains intubated per anesthesia plan and Patient placed on Ventilator (see vital sign flow sheet for setting)  Post-op Assessment: Report given to RN and Post -op Vital signs reviewed and stable  Post vital signs: Reviewed and stable  Last Vitals:  Vitals:   09/16/16 1000 09/16/16 1130  BP: (!) 200/90 140/76  Pulse:  76  Resp:  (!) 30  Temp:    SpO2:  97%    Last Pain:  Vitals:   09/16/16 1238  TempSrc:   PainSc: 6          Complications: No apparent anesthesia complications

## 2016-09-16 NOTE — ED Notes (Signed)
New results came back from CT results, ED provider needs to consult surgeon, pt staying in ED

## 2016-09-16 NOTE — Anesthesia Preprocedure Evaluation (Addendum)
Anesthesia Evaluation  Patient identified by MRN, date of birth, ID band Patient awake    Reviewed: Allergy & Precautions, H&P , NPO status , Patient's Chart, lab work & pertinent test results, reviewed documented beta blocker date and time   Airway Mallampati: II  TM Distance: >3 FB Neck ROM: full    Dental  (+) Teeth Intact   Pulmonary neg pulmonary ROS, former smoker,    Pulmonary exam normal        Cardiovascular + Past MI  negative cardio ROS Normal cardiovascular exam Rhythm:regular Rate:Normal     Neuro/Psych negative neurological ROS  negative psych ROS   GI/Hepatic negative GI ROS, Neg liver ROS,   Endo/Other  negative endocrine ROS  Renal/GU negative Renal ROS  negative genitourinary   Musculoskeletal  (+) Arthritis ,   Abdominal   Peds  Hematology negative hematology ROS (+)   Anesthesia Other Findings Past Medical History: No date: Arthritis 1957: Blood clot embolism during pregnancy, antepartum     Comment:  left thigh No date: Fibrocystic breast     Comment:  left breast No date: Gastritis 1980's: History of arthroscopy of knee     Comment:  Right bbb.This is an emergency, the pt is in incredible pain with SOB. No stents were placed after her MI.Family knows she is high risk, and that she may end up in the ICU. Very hypertensive this am. No date: Hyperlipemia No date: Lumbar stenosis     Comment:  2 buldging disc 2008: Myocardial infarction [facility]) Past Surgical History: No date: ABDOMINAL HYSTERECTOMY No date: APPENDECTOMY No date: BREAST SURGERY 2007: CARPAL TUNNEL RELEASE; Bilateral 1980's: GUM SURGERY 08/14/2016: HIP ARTHROPLASTY; Left     Comment:  Procedure: ARTHROPLASTY BIPOLAR HIP (HEMIARTHROPLASTY);               Surgeon: Lovell Sheehan, MD;  Location: [facility] ORS;                Service: Orthopedics;  Laterality: Left; 1980's: KNEE ARTHROSCOPY; Bilateral BMI    Body Mass Index:   18.25 kg/m     Reproductive/Obstetrics negative OB ROS                           Anesthesia Physical Anesthesia Plan  ASA: IV and emergent  Anesthesia Plan: General ETT   Post-op Pain Management:    Induction:   PONV Risk Score and Plan: 4 or greater and Ondansetron, Dexamethasone, Midazolam and Propofol infusion  Airway Management Planned:   Additional Equipment:   Intra-op Plan:   Post-operative Plan:   Informed Consent: I have reviewed the patients History and Physical, chart, labs and discussed the procedure including the risks, benefits and alternatives for the proposed anesthesia with the patient or authorized representative who has indicated his/her understanding and acceptance.   Dental Advisory Given  Plan Discussed with: CRNA  Anesthesia Plan Comments: (ecg noted and pt is at very high risk considering history and current status. May require post op vent supports. JA)        Anesthesia Quick Evaluation

## 2016-09-16 NOTE — Op Note (Signed)
Procedure Date:  09/16/2016  Pre-operative Diagnosis:   Pneumoperitoneum  Post-operative Diagnosis:  Duodenal perforation  Procedure:  Exploratory Laparotomy, distal gastrectomy, duodenal stump closure with omental patch, retrocolic gastrojejunostomy, Moss feeding tube placement into efferent gastrojejunostomy limb, retrograde duodenal drainage tube placement  Surgeon:  Melvyn Neth, MD  Assistant:  Tama High, MD  Anesthesia:  General endotracheal  Estimated Blood Loss:  50 ml  Specimens:  Distal stomach with anterior duodenal wall  Complications:  None  Indications for Procedure:  This is a 83 y.o. female who presents with abdominal pain and peritonitis and workup revealing pneumoperitoneum.  The risks of bleeding, abscess or infection, injury to surrounding structures, and need for further procedures were all discussed with the patient and was willing to proceed.  Description of Procedure: The patient was correctly identified in the preoperative area and brought into the operating room.  The patient was placed supine with VTE prophylaxis in place.  Appropriate time-outs were performed.  Anesthesia was induced and the patient was intubated.  Foley catheter and NG tube were placed.  Appropriate antibiotics were infused.  The abdomen was prepped and draped in a sterile fashion.  A midline incision was made and electrocautery was used to dissect down the subcutaneous tissue to the fascia.  The fascia was incised and extended superiorly and inferiorly, entering the abdomen.  The patient had copious amount of gastric content throughout her abdomen.  This was all suctioned.  Upon exploring her abdomen, attention was first given to the stomach and duodenum.  The stomach was intact, but the duodenum had a large perforation involving 50% of the anterior wall of the first portion of the duodenum.  There was significant scarring and inflammation in this area, with attachments to the liver  capsule, the Falciform ligament, and the gallbladder.  First, we started with mobilization of the right colon and hepatic flexure, all the way to the mid transverse colon.  This allowed for better exposure of the mid duodenum, which appeared intact.  The transverse colon was freed off its attachments to the stomach.  A window was created in the lesser sac and a GIA blue load stapler x 2 loads were used to staple the distal stomach off.  The portion of the duodenum with the perforation was significantly scarred to the gallbladder, falciform ligament, and liver capsule.  LigaSure was used to mobilize it off the falciform ligament and the liver capsule, but we could not distinguish where the common bile duct was going into the duodenum, and with that, we could not find a safe portion distal to the perforation to staple across.  Thus, we decided to resect the tissue around the perforation all the way down to the opening in the duodenum.  This resulted in two specimens including the distal stomach and anterior duodenal wall, and a second specimen of only duodenal wall.  The duodenal opening was then closed with a 2-0 Prolene purse string.  A lip of omentum was mobilized from the transverse colon and secured over the duodenal stump using 3-0 Silk sutures as an omental patch.   Then we proceeded with our gastrojejunostomy.  A segment of jejunum about 30 cm from the Ligament of Treitz was passed in retrocolic fashion to the posterior distal stomach.  3-0 Silk sutures were used to line up the jejunum with stomach in place.  Two enterotomies were used to place the GIA stapler blue load and create an anastomosis.  The common channel was then closed  using another blue staple load.  The staple lines were then imbricated using Lembert sutures.  The mesenteric defect created in the mesocolon was secured to the anastomosis using 3-0 silk sutures as well to prevent any internal hernias.  Following this, the abdomen was  thoroughly irrigated using 4 L of warm normal saline.  Two 19 Fr. Blake drains were placed, one around the omental patch covering the duodenal stump, and another one going by the gastrojejunostomy anastomosis.  A Moss GJ tube was placed via a gastrotomy and secured with a purse string.  The jejunal feeding portion was milked through the efferent limb of the GJ.  The balloon was inflated and the tube was secured with a purse string, pulling the stomach wall to the abdominal wall.  Then a flexible T-tube was used, cutting the T component and creating more side holes, and passed through another gastrotomy towards the afferent limb to help drain the duodenal stump.  The tube was secured with a purse string as well.  That portion of the stomach wall was also secured to the abdominal wall using 3-0 silk sutures.   A total of 100 ml of Exparel solution was infiltrated in the fascia and subcutaneous tissue.  The wound was then closed using loop PDS x 2.  The wound was then irrigated and closed with staples.  All tubes and drains were secured to the skin using Nylon sutures and dressed with gauze and tegaderm.  The midline incision was dressed with Honeycomb dressing.  The patient tolerated the procedure well and all counts were correct at the end of the case.  Patient will remain intubated and be transferred directly to the ICU.   Melvyn Neth, MD

## 2016-09-16 NOTE — Anesthesia Post-op Follow-up Note (Signed)
Anesthesia QCDR form completed.        

## 2016-09-16 NOTE — ED Notes (Signed)
In and out catheter performed, obtained cloudy yellow urine, sterile procedure was maintained.

## 2016-09-16 NOTE — Progress Notes (Addendum)
ANTIBIOTIC CONSULT NOTE - INITIAL  Pharmacy Consult for Zosyn Indication: intra-abdominal infection   Allergies  Allergen Reactions  . Carafate [Sucralfate] Other (See Comments)    Unknown, pt does not remember  . Lipitor [Atorvastatin] Other (See Comments)    Unknown, pt can't remember   . Mobic [Meloxicam] Other (See Comments)    Pt does not remember  . Penicillins Other (See Comments)    Pt does not remember  . Tramadol Other (See Comments)    Pt does not remember    Patient Measurements: Weight: 103 lb (46.7 kg) Adjusted Body Weight:   Vital Signs: Temp: 98.2 F (36.8 C) (09/06 0857) Temp Source: Oral (09/06 0857) BP: 140/76 (09/06 1130) Pulse Rate: 76 (09/06 1130) Intake/Output from previous day: No intake/output data recorded. Intake/Output from this shift: No intake/output data recorded.  Labs:  Recent Labs  09/16/16 0900  WBC 13.0*  HGB 13.2  PLT 875*  CREATININE 0.81   Estimated Creatinine Clearance: 34 mL/min (by C-G formula based on SCr of 0.81 mg/dL). No results for input(s): VANCOTROUGH, VANCOPEAK, VANCORANDOM, GENTTROUGH, GENTPEAK, GENTRANDOM, TOBRATROUGH, TOBRAPEAK, TOBRARND, AMIKACINPEAK, AMIKACINTROU, AMIKACIN in the last 72 hours.   Microbiology: No results found for this or any previous visit (from the past 720 hour(s)).  Medical History: Past Medical History:  Diagnosis Date  . Arthritis   . Blood clot embolism during pregnancy, antepartum 1957   left thigh  . Fibrocystic breast    left breast  . Gastritis   . History of arthroscopy of knee 1980's   right  . Hyperlipemia   . Lumbar stenosis    2 buldging disc  . Myocardial infarction [facility]) 2008    Medications:   (Not in a hospital admission) Scheduled:   Assessment: Pharmacy consulted to dose and monitor rocephin in this 83 year old woman being treated for intraabdominal infection   Goal of Therapy:    Plan:  Patient received Ceftriaxone 1 g IV x 1 in ED. MD Mody ok  with giving Zosyn. Will give Zosyn 3.375 g IV x 1 in ED. Spoke with RN about watching closely for any signs and symptoms of an allergic reaction and contacting MD if symptoms occur.   Will then start Zosyn 3.375g IV q8hours.    Daiwik Buffalo D 09/16/2016,12:11 PM

## 2016-09-16 NOTE — Consult Note (Signed)
Name: ADINE HEIMANN MRN: [id number] DOB: [birth year]    ADMISSION DATE:  09/16/2016  CONSULTATION DATE:  09/16/16  REFERRING MD :  Dr. Hampton Abbot  CHIEF COMPLAINT:  Abdominal Pain  BRIEF PATIENT DESCRIPTION: 83 year old female with pneumoperitoneum and duodenal perforation s/p exploratory laprotomy   SIGNIFICANT EVENTS  9/6 Patient brought to the ICU s/p exploratory laprotomy for perforated mucus on mechanical ventilation  STUDIES:  09/16/16 CT abdomen and pelvis>>Acute peri-pyloric/proximal duodenum ulcer perforation with free peritoneal air. There is associated inflammatory changes of proximal duodenum and distal stomach.Atherosclerotic changes of the abdominal aorta and mesenteric vessels, without high-grade stenosis of celiac artery, superior mesenteric artery, or inferior mesenteric artery.Tortuous bilateral iliac system. Irregular beaded appearance of theproximal right external iliac artery, potentially representing fibromuscular dysplasia, although an unusual distribution.Avulsion fracture of the left anterior inferior iliac spine, presenton the comparison CT.  HISTORY OF PRESENT ILLNESS:  Henretta Quist is a 83 year old female with known history of MI,Hyperlipidemia and Dementia.  Patient has been dealing with constipation for long time which has been more recently upper abdominal pain for the past week. She was recently seen in the ED and was noted to be impacted with fecal material. She was disimpacted . She came back with increased abdominal pain .  CT of her abdomen was concerning for pneumoperitoneum with possible perforated ulcer.  She was taken to the OR and had exploratory laprotomy for perforated viscous.  Patient was transferred to the ICU on mechanical ventilation  PAST MEDICAL HISTORY :   has a past medical history of Arthritis; Blood clot embolism during pregnancy, antepartum (3295); Fibrocystic breast; Gastritis; History of arthroscopy of knee (1980's); Hyperlipemia; Lumbar  stenosis; and Myocardial infarction ([place]) (2008).  has a past surgical history that includes Knee arthroscopy (Bilateral, 1980's); Abdominal hysterectomy; Appendectomy; Breast surgery; Carpal tunnel release (Bilateral, 2007); Gum surgery (1980's); and Hip Arthroplasty (Left, 08/14/2016). Prior to Admission medications   Medication Sig Start Date End Date Taking? Authorizing Provider  acetaminophen (TYLENOL) 500 MG tablet Take 1,000 mg by mouth 3 (three) times daily.    Yes [provider]  aspirin EC 325 MG EC tablet Take 1 tablet (325 mg total) by mouth daily with breakfast. 08/17/16  Yes Fritzi Mandes, MD  diclofenac (VOLTAREN) 50 MG EC tablet Take 50 mg by mouth 2 (two) times daily.   Yes [provider]  docusate sodium (COLACE) 100 MG capsule Take 200 mg by mouth at bedtime.    Yes [provider]  hydrochlorothiazide (HYDRODIURIL) 25 MG tablet Take 25 mg by mouth daily.   Yes [provider]  metoprolol tartrate (LOPRESSOR) 25 MG tablet Take 1 tablet (25 mg total) by mouth 2 (two) times daily. 08/16/16  Yes Fritzi Mandes, MD  Multiple Vitamins-Minerals (CENTRUM SILVER ULTRA WOMENS PO) Take 1 tablet by mouth every morning.   Yes [provider]  oxyCODONE (OXY IR/ROXICODONE) 5 MG immediate release tablet Take 1 tablet (5 mg total) by mouth every 6 (six) hours as needed for breakthrough pain. 08/16/16  Yes Fritzi Mandes, MD  [place] (OYSTER CALCIUM) 500 MG TABS tablet Take 500 mg of elemental calcium by mouth daily.   Yes [provider]  senna (SENOKOT) 8.6 MG TABS tablet Take 1 tablet by mouth daily.    Yes [provider]  sertraline (ZOLOFT) 25 MG tablet Take 25 mg by mouth daily.    [provider]   Allergies  Allergen Reactions  . Carafate [Sucralfate] Other (  See Comments)    Unknown, pt does not remember  . Lipitor [Atorvastatin] Other (See Comments)    Unknown, pt can't remember   . Mobic [Meloxicam] Other (See  Comments)    Pt does not remember  . Penicillins Other (See Comments)    Pt does not remember  . Tramadol Other (See Comments)    Pt does not remember    FAMILY HISTORY:  family history includes Dementia in her father; Heart attack in her mother; Tuberculosis in her father. SOCIAL HISTORY:  reports that she quit smoking about 49 years ago. Her smoking use included Cigarettes. She has never used smokeless tobacco. She reports that she does not drink alcohol or use drugs.  REVIEW OF SYSTEMS:   Constitutional: Negative for fever, chills, weight loss, malaise/fatigue and diaphoresis.  HENT: Negative for hearing loss, ear pain, nosebleeds, congestion, sore throat, neck pain, tinnitus and ear discharge.   Eyes: Negative for blurred vision, double vision, photophobia, pain, discharge and redness.  Respiratory: Negative for cough, hemoptysis, sputum production, shortness of breath, wheezing and stridor.   Cardiovascular: Negative for chest pain, palpitations, orthopnea, claudication, leg swelling and PND.  Gastrointestinal: Negative for heartburn, nausea, vomiting, abdominal pain, diarrhea, constipation, blood in stool and melena.  Genitourinary: Negative for dysuria, urgency, frequency, hematuria and flank pain.  Musculoskeletal: Negative for myalgias, back pain, joint pain and falls.  Skin: Negative for itching and rash.  Neurological: Negative for dizziness, tingling, tremors, sensory change, speech change, focal weakness, seizures, loss of consciousness, weakness and headaches.  Endo/Heme/Allergies: Negative for environmental allergies and polydipsia. Does not bruise/bleed easily.  SUBJECTIVE: Patient is intubated and on mechanical ventilation .  Patient is not in any acute distress  VITAL SIGNS: Temp:  [98.2 F (36.8 C)] 98.2 F (36.8 C) (09/06 2000) Pulse Rate:  [40-114] 101 (09/06 2000) Resp:  [16-30] 17 (09/06 2000) BP: (125-200)/(70-90) 125/70 (09/06 2000) SpO2:  [97 %-100 %] 100  % (09/06 2000) Weight:  [44.7 kg (98 lb 8.7 oz)-46.7 kg (103 lb)] 44.7 kg (98 lb 8.7 oz) (09/06 2004)  PHYSICAL EXAMINATION: General:  83 year old female, intubated and on mechanical ventilation. Is not in any distress Neuro:  Awake and alert HEENT: AT,[place],No jvd Cardiovascular: s1s2,regular,no m/r/g noted Lungs: clear bilaterally, no wheezes,crackles,rhonchi Abdomen:  Soft,NT,no bowel sounds Musculoskeletal:  No edema/cyanosis  Skin:  Warm,dr,and intact   Recent Labs Lab 09/16/16 0900  NA 134*  K 4.4  CL 93*  CO2 29  BUN 15  CREATININE 0.81  GLUCOSE 171*    Recent Labs Lab 09/16/16 0900  HGB 13.2  HCT 38.2  WBC 13.0*  PLT 875*   Dg Abd 2 Views  Result Date: 09/16/2016 CLINICAL DATA:  Diffuse abdominal pain starting this morning, constipation and abdominal pain for several months but severely worse today, history gastritis, MI, former smoker EXAM: ABDOMEN - 2 VIEW COMPARISON:  08/31/2016 FINDINGS: Lung bases clear. Increased stool throughout colon. Nonobstructive bowel gas pattern. No bowel dilatation or bowel wall thickening. No free intraperitoneal air. Osseous demineralization with biconvex thoracolumbar scoliosis and note of a LEFT hip prosthesis. Scattered costal cartilaginous calcifications without definite urinary tract calcifications. IMPRESSION: Increased stool throughout colon. Otherwise negative exam. Electronically Signed   By: Lavonia Dana M.D.   On: 09/16/2016 09:36   Ct Angio Abd/pel W/ And/or W/o  Result Date: 09/16/2016 CLINICAL DATA:  83 year old female with a history of ongoing abdominal pain EXAM: CTA ABDOMEN AND PELVIS WITH CONTRAST TECHNIQUE: Multidetector CT imaging of the  abdomen and pelvis was performed using the standard protocol during bolus administration of intravenous contrast. Multiplanar reconstructed images and MIPs were obtained and reviewed to evaluate the vascular anatomy. CONTRAST:  80 cc Isovue 370 COMPARISON:  None. FINDINGS: VASCULAR Aorta:  Mild tortuosity of the lower thoracic and abdominal aorta. No aneurysm. No dissection flap. Scattered calcifications of the abdominal aorta. No periaortic fluid. Celiac: Moderate atherosclerotic changes at the celiac artery origin which is patent. Branch vessels are patent. SMA: Moderate atherosclerotic changes at the origin of the superior mesenteric artery which remains patent. Renals: Single bilateral renal arteries with mild atherosclerotic changes at the origin of the left renal artery. IMA: Inferior mesenteric artery is patent. Right lower extremity: Tortuous right-sided iliac system, with a regular beading appearance of the proximal external iliac artery, unchanged from prior. No significant stenosis. No dissection. Proximal femoral vasculature patent. Hypogastric artery patent. Left lower extremity: Tortuous left-sided iliac system. Hypogastric artery patent. Proximal femoral vasculature patent. Veins: Unremarkable appearance of the venous system. Review of the MIP images confirms the above findings. NON-VASCULAR Lower chest: Unremarkable. Hepatobiliary: Unremarkable appearance of liver parenchyma. Unremarkable gallbladder. Pancreas: Unremarkable pancreas Spleen: Unremarkable spleen Adrenals/Urinary Tract: Unremarkable adrenal glands Right: No hydronephrosis. Symmetric perfusion to the left. No nephrolithiasis. Unremarkable course of the right ureter. Left: No hydronephrosis. Symmetric perfusion to the right. No nephrolithiasis. Unremarkable course of the left ureter. Unremarkable appearance of the urinary bladder . Stomach/Bowel: Small hiatal hernia. Circumferential thickening of the very pyloric region, distal stomach, with focal gaseous outpouching on the anterior wall of the proximal duodenum compatible with perforation of ulcer. This is in direct communication to free air on the anterior surface of the liver. Relatively decompressed small bowel. Moderate to large stool burden throughout the length of  the colon. Fluid filled distal colon with no significant burden of formed stool in the rectum. No transition. Appendix not identified. No significant inflammatory changes adjacent to the cecum. Lymphatic: No lymphadenopathy. Mesenteric: Significant volume of free air within the abdomen. Small foci in the hepatic hilum and adjacent to the proximal duodenum, compatible with proximal small bowel perforation. Free air layers inferiorly along the anterior abdominal wall to the lower abdomen/ pelvis. Small amount of free fluid within the dependent abdomen. Reproductive: Hysterectomy Other: No abdominal hernia. Musculoskeletal: Avulsion fracture of the left anterior inferior iliac spine. Multilevel degenerative changes of the visualized thoracolumbar spine. S-shaped curvature, with right apex curvature of the lumbar spine centered at L3. Associated degenerative disc disease. Surgical changes of left hip arthroplasty IMPRESSION: Acute peri-pyloric/proximal duodenum ulcer perforation with free peritoneal air. There is associated inflammatory changes of proximal duodenum and distal stomach. These preliminary results were called by telephone at the time of interpretation on 09/16/2016 at 12:43 pm to Dr. Lenise Arena , who verbally acknowledged these results. Atherosclerotic changes of the abdominal aorta and mesenteric vessels, without high-grade stenosis of celiac artery, superior mesenteric artery, or inferior mesenteric artery. Tortuous bilateral iliac system. Irregular beaded appearance of the proximal right external iliac artery, potentially representing fibromuscular dysplasia, although an unusual distribution. Avulsion fracture of the left anterior inferior iliac spine, present on the comparison CT. Left hip arthroplasty. Electronically Signed   By: Corrie Mckusick D.O.   On: 09/16/2016 12:54    ASSESSMENT / PLAN:  On mechanical ventilation s/p exploratory lap pneumoperitoneum, likely from a perforated peptic  ulcer, s/p exploratory laparotomy Mildly elevated troponin Leukocytosis UTI  Plan Received  One dose of ceftriaxone Flagyl added per primary team for broader coverage  F/u echo Continue I/v fluids Keep MAP GOALS>55 Strict I/O Monitor fever,cbc Trend troponins Surgery following the patient.    Bincy Varughese,AG-ACNP Pulmonary and [place]   09/16/2016, 10:33 PM

## 2016-09-16 NOTE — Progress Notes (Addendum)
CT scan shows perforation. D/w Dr Jimmye Norman.  Patient will now be admitted to surgical services. Hospital service will consult.

## 2016-09-16 NOTE — Progress Notes (Signed)
eLink Physician-Brief Progress Note Patient Name: Misty Lucas DOB: 1924-05-25 MRN: [id number]   Date of Service  09/16/2016  HPI/Events of Note  84 yo female with PMH of Dementia. Returns from OR intubated and ventilated s/p ex lap for perforated viscous. PCCM consulted for ventilator management. VSS.   eICU Interventions  No new orders.      Intervention Category Evaluation Type: New Patient Evaluation  Lysle Dingwall 09/16/2016, 8:11 PM

## 2016-09-16 NOTE — ED Notes (Signed)
Patient transported to CT 

## 2016-09-16 NOTE — ED Triage Notes (Signed)
Pt to ed via ems from [facility].  Pt reports abd pain and constipation that has been going on for several months.  Pt skin warm and dry,  Pt BS hypoactive at this time. Upon arrival to ED, IV started and labs drawn and sent. Pt placed on CM.

## 2016-09-16 NOTE — ED Notes (Signed)
Sop suds enema administered to pt.  Pt tolerated well. Continues to report abd pain across upper and lower abd,  Lower abd hard to palpation.

## 2016-09-16 NOTE — Progress Notes (Signed)
   Called by nursing secondary to patient losing her NG tube.  Patient has a GJ tube are in place. Gastric port is currently the Foley bag.  Okay to leave NG tube out.  Clayburn Pert, MD [facility]  [facility] [phone] [facility] [phone]

## 2016-09-16 NOTE — Anesthesia Procedure Notes (Signed)
Procedure Name: Intubation Date/Time: 09/16/2016 2:40 PM Performed by: Nelda Marseille Pre-anesthesia Checklist: Patient identified, Patient being monitored, Timeout performed, Emergency Drugs available and Suction available Patient Re-evaluated:Patient Re-evaluated prior to induction Oxygen Delivery Method: Circle system utilized Preoxygenation: Pre-oxygenation with 100% oxygen Induction Type: IV induction Ventilation: Mask ventilation without difficulty Laryngoscope Size: Mac and 3 Grade View: Grade I Tube type: Oral Tube size: 7.0 mm Number of attempts: 1 Airway Equipment and Method: Stylet Placement Confirmation: ETT inserted through vocal cords under direct vision,  positive ETCO2 and breath sounds checked- equal and bilateral Secured at: 21 cm Tube secured with: Tape Dental Injury: Teeth and Oropharynx as per pre-operative assessment

## 2016-09-16 NOTE — ED Provider Notes (Addendum)
[facility] Emergency Department Provider Note       Time seen: ----------------------------------------- 9:00 AM on 09/16/2016 -----------------------------------------     I have reviewed the triage vital signs and the nursing notes.   HISTORY   Chief Complaint Abdominal Pain and Constipation    HPI Misty Lucas is a 83 y.o. female who presents to the ED for abdominal pain and constipation this been going on for several months. Patient arrives via EMS from [place]. She denies fevers, chills, chest pain, shortness of breath, vomiting but states she cannot have a bowel movement. She does have some abdominal discomfort.   Past Medical History:  Diagnosis Date  . Arthritis   . Blood clot embolism during pregnancy, antepartum 1957   left thigh  . Fibrocystic breast    left breast  . Gastritis   . History of arthroscopy of knee 1980's   right  . Hyperlipemia   . Lumbar stenosis    2 buldging disc  . Myocardial infarction [facility]) 2008    Patient Active Problem List   Diagnosis Date Noted  . Closed left hip fracture ([place]) 08/13/2016    Past Surgical History:  Procedure Laterality Date  . ABDOMINAL HYSTERECTOMY    . APPENDECTOMY    . BREAST SURGERY    . CARPAL TUNNEL RELEASE Bilateral 2007  . GUM SURGERY  1980's  . HIP ARTHROPLASTY Left 08/14/2016   Procedure: ARTHROPLASTY BIPOLAR HIP (HEMIARTHROPLASTY);  Surgeon: Lovell Sheehan, MD;  Location: [facility] ORS;  Service: Orthopedics;  Laterality: Left;  . KNEE ARTHROSCOPY Bilateral 1980's    Allergies Carafate [sucralfate]; Lipitor [atorvastatin]; Mobic [meloxicam]; Penicillins; and Tramadol  Social History Social History  Substance Use Topics  . Smoking status: Former Smoker    Types: Cigarettes    Quit date: 03/26/1967  . Smokeless tobacco: Never Used  . Alcohol use No    Review of Systems Constitutional: Negative for fever. Cardiovascular: Negative for chest  pain. Respiratory: Negative for shortness of breath. Gastrointestinal: positive for abdominal pain, constipation Genitourinary: Negative for dysuria. Musculoskeletal: Negative for back pain. Skin: Negative for rash. Neurological: Negative for headaches, focal weakness or numbness.  All systems negative/normal/unremarkable except as stated in the HPI  ____________________________________________   PHYSICAL EXAM:  VITAL SIGNS: ED Triage Vitals  Enc Vitals Group     BP --      Pulse --      Resp 09/16/16 0857 16     Temp --      Temp Source 09/16/16 0857 Oral     SpO2 09/16/16 0857 100 %     Weight 09/16/16 0857 103 lb (46.7 kg)     Height --      Head Circumference --      Peak Flow --      Pain Score 09/16/16 0856 6     Pain Loc --      Pain Edu? --      Excl. in [place]? --     Constitutional: Alert and oriented. Well appearing and in no distress. Eyes: Conjunctivae are normal. Normal extraocular movements. ENT   Head: Normocephalic and atraumatic.   Nose: No congestion/rhinnorhea.   Mouth/Throat: Mucous membranes are moist.   Neck: No stridor. Cardiovascular: Normal rate, regular rhythm. No murmurs, rubs, or gallops. Respiratory: Normal respiratory effort without tachypnea nor retractions. Breath sounds are clear and equal bilaterally. No wheezes/rales/rhonchi. Gastrointestinal: Soft and nontender. Normal bowel sounds Rectal: fecal impaction is noted Musculoskeletal: Nontender with normal  range of motion in extremities. No lower extremity tenderness nor edema. Neurologic:  Normal speech and language. No gross focal neurologic deficits are appreciated.  Skin:  Skin is warm, dry and intact. No rash noted. Psychiatric: Mood and affect are normal. Speech and behavior are normal.  ____________________________________________  ED COURSE:  Pertinent labs & imaging results that were available during my care of the patient were reviewed by me and considered in my  medical decision making (see chart for details). Patient presents for constipation, we will assess with labs and imaging as indicated.  EKG interpreted by me reveals sinus tachycardia with an irregular rate, incomplete right bundle-branch, nonspecific ST changes,   Procedures ____________________________________________   LABS (pertinent positives/negatives)  Labs Reviewed  CBC WITH DIFFERENTIAL/PLATELET - Abnormal; Notable for the following:       Result Value   WBC 13.0 (*)    Platelets 875 (*)    Neutro Abs 11.0 (*)    All other components within normal limits  COMPREHENSIVE METABOLIC PANEL - Abnormal; Notable for the following:    Sodium 134 (*)    Chloride 93 (*)    Glucose, Bld 171 (*)    Total Protein 8.2 (*)    Albumin 3.2 (*)    All other components within normal limits  TROPONIN I - Abnormal; Notable for the following:    Troponin I 0.05 (*)    All other components within normal limits  URINALYSIS, COMPLETE (UACMP) WITH MICROSCOPIC - Abnormal; Notable for the following:    Color, Urine AMBER (*)    APPearance CLOUDY (*)    Ketones, ur 5 (*)    Protein, ur 30 (*)    Nitrite POSITIVE (*)    Leukocytes, UA SMALL (*)    All other components within normal limits  LACTIC ACID, PLASMA - Abnormal; Notable for the following:    Lactic Acid, Venous 2.2 (*)    All other components within normal limits  LIPASE, BLOOD   CRITICAL CARE Performed by: Earleen Newport   Total critical care time: 30 minutes  Critical care time was exclusive of separately billable procedures and treating other patients.  Critical care was necessary to treat or prevent imminent or life-threatening deterioration.  Critical care was time spent personally by me on the following activities: development of treatment plan with patient and/or surrogate as well as nursing, discussions with consultants, evaluation of patient's response to treatment, examination of patient, obtaining history from  patient or surrogate, ordering and performing treatments and interventions, ordering and review of laboratory studies, ordering and review of radiographic studies, pulse oximetry and re-evaluation of patient's condition.  RADIOLOGY Images were viewed by me  abdomen 2 view IMPRESSION: Increased stool throughout colon.  Otherwise negative exam. ____________________________________________  FINAL ASSESSMENT AND PLAN  abdominal pain, constipation, UTI, elevated troponin, EKG changes  Plan: Patient's labs and imaging were dictated above. Patient had presented for abdominal pain and difficulty having a bowel movement. She does appear constipated but was given an enema without significant stool output. She doesn't UTI was treated with Rocephin, she has a minimally elevated lactate and therefore she was given IV fluids. She was given aspirin to protect her heart. She does have new concerning EKG changes with history of T-wave inversions and now currently all of her T waves are now upright. I will discuss with the hospitalist for admission.   additional diagnosis is bowel perforation  I was informed by the radiologist of bowel perforation. I have discussed with  general surgery and currently she has stable vital signs.  Earleen Newport, MD   Note: This note was generated in part or whole with voice recognition software. Voice recognition is usually quite accurate but there are transcription errors that can and very often do occur. I apologize for any typographical errors that were not detected and corrected.     Earleen Newport, MD 09/16/16 1119    Earleen Newport, MD 09/16/16 [phone]

## 2016-09-16 NOTE — ED Notes (Signed)
Report received from [facility], pt removed from bedpan, family and ED provider at bedside discussing plan of care.

## 2016-09-16 NOTE — ED Notes (Signed)
Admitting Provider at bedside. 

## 2016-09-16 NOTE — H&P (Addendum)
[place] [facility] [place] NAME: Misty Lucas    MR#:  [id number]  DATE OF BIRTH:  [birth year]  DATE OF ADMISSION:  09/16/2016  PRIMARY CARE PHYSICIAN: Rusty Aus, MD   REQUESTING/REFERRING PHYSICIAN: dr Jimmye Norman  CHIEF COMPLAINT:   Diffuse abdominal pain. HISTORY OF PRESENT ILLNESS:  Misty Lucas  is a 83 y.o. female with a known history of Dementia who presents with abdominal pain. Patient was seen a week ago and emergency room with similar complaints of abdominal pain and found to have large burden of stool. She was disimpacted and abdominal pain had subsided. She presents again with similar complaints of abdominal pain. During last ER visit she also had a CTA which was negative for ischemia or mass in the abdomen. Today x-ray does show large amount of stool burden. She has had bowel movement however continues to have persistent diffuse abdominal pain. It is noted that EKG today shows upright T waves which were not apparent on last week's EKG. She is not complaining of chest pain. She is also noted to have elevated lactic acid level and urinary tract infection. She received antibiotics.  PAST MEDICAL HISTORY:   Past Medical History:  Diagnosis Date  . Arthritis   . Blood clot embolism during pregnancy, antepartum 1957   left thigh  . Fibrocystic breast    left breast  . Gastritis   . History of arthroscopy of knee 1980's   right  . Hyperlipemia   . Lumbar stenosis    2 buldging disc  . Myocardial infarction [facility]) 2008    PAST SURGICAL HISTORY:   Past Surgical History:  Procedure Laterality Date  . ABDOMINAL HYSTERECTOMY    . APPENDECTOMY    . BREAST SURGERY    . CARPAL TUNNEL RELEASE Bilateral 2007  . GUM SURGERY  1980's  . HIP ARTHROPLASTY Left 08/14/2016   Procedure: ARTHROPLASTY BIPOLAR HIP (HEMIARTHROPLASTY);  Surgeon: Lovell Sheehan, MD;  Location: [facility] ORS;  Service: Orthopedics;  Laterality: Left;  . KNEE ARTHROSCOPY  Bilateral 1980's    SOCIAL HISTORY:   Social History  Substance Use Topics  . Smoking status: Former Smoker    Types: Cigarettes    Quit date: 03/26/1967  . Smokeless tobacco: Never Used  . Alcohol use No    FAMILY HISTORY:   Family History  Problem Relation Age of Onset  . Heart attack Mother   . Tuberculosis Father   . Dementia Father     DRUG ALLERGIES:   Allergies  Allergen Reactions  . Carafate [Sucralfate] Other (See Comments)    Unknown, pt does not remember  . Lipitor [Atorvastatin] Other (See Comments)    Unknown, pt can't remember   . Mobic [Meloxicam] Other (See Comments)    Pt does not remember  . Penicillins Other (See Comments)    Pt does not remember  . Tramadol Other (See Comments)    Pt does not remember    REVIEW OF SYSTEMS:   Review of Systems  Constitutional: Negative.  Negative for chills, fever and malaise/fatigue.  HENT: Negative.  Negative for ear discharge, ear pain, hearing loss, nosebleeds and sore throat.   Eyes: Negative.  Negative for blurred vision and pain.  Respiratory: Negative.  Negative for cough, hemoptysis, shortness of breath and wheezing.   Cardiovascular: Negative.  Negative for chest pain, palpitations and leg swelling.  Gastrointestinal: Positive for abdominal pain. Negative for blood in stool, diarrhea, nausea and vomiting.  Genitourinary: Negative.  Negative for dysuria.  Musculoskeletal: Negative.  Negative for back pain.  Skin: Negative.   Neurological: Negative for dizziness, tremors, speech change, focal weakness, seizures and headaches.  Endo/Heme/Allergies: Negative.  Does not bruise/bleed easily.  Psychiatric/Behavioral: Positive for memory loss. Negative for depression, hallucinations and suicidal ideas.    MEDICATIONS AT HOME:   Prior to Admission medications   Medication Sig Start Date End Date Taking? Authorizing Provider  acetaminophen (TYLENOL) 500 MG tablet Take 1,000 mg by mouth 3 (three) times  daily.    Yes [provider]  aspirin EC 325 MG EC tablet Take 1 tablet (325 mg total) by mouth daily with breakfast. 08/17/16  Yes Fritzi Mandes, MD  diclofenac (VOLTAREN) 50 MG EC tablet Take 50 mg by mouth 2 (two) times daily.   Yes [provider]  docusate sodium (COLACE) 100 MG capsule Take 200 mg by mouth at bedtime.    Yes [provider]  hydrochlorothiazide (HYDRODIURIL) 25 MG tablet Take 25 mg by mouth daily.   Yes [provider]  metoprolol tartrate (LOPRESSOR) 25 MG tablet Take 1 tablet (25 mg total) by mouth 2 (two) times daily. 08/16/16  Yes Fritzi Mandes, MD  Multiple Vitamins-Minerals (CENTRUM SILVER ULTRA WOMENS PO) Take 1 tablet by mouth every morning.   Yes [provider]  oxyCODONE (OXY IR/ROXICODONE) 5 MG immediate release tablet Take 1 tablet (5 mg total) by mouth every 6 (six) hours as needed for breakthrough pain. 08/16/16  Yes Fritzi Mandes, MD  [place] (OYSTER CALCIUM) 500 MG TABS tablet Take 500 mg of elemental calcium by mouth daily.   Yes [provider]  senna (SENOKOT) 8.6 MG TABS tablet Take 1 tablet by mouth daily.    Yes [provider]  sertraline (ZOLOFT) 25 MG tablet Take 25 mg by mouth daily.    [provider]      VITAL SIGNS:  Blood pressure 140/76, pulse 76, temperature 98.2 F (36.8 C), temperature source Oral, resp. rate (!) 30, weight 46.7 kg (103 lb), SpO2 97 %.  PHYSICAL EXAMINATION:   Physical Exam  Constitutional: She is oriented to person, place, and time and well-developed, well-nourished, and in no distress. No distress.  HENT:  Head: Normocephalic.  Eyes: No scleral icterus.  Neck: Normal range of motion. Neck supple. No JVD present. No tracheal deviation present.  Cardiovascular: Normal rate and normal heart sounds.  Exam reveals no gallop and no friction rub.   No murmur heard. Irregular, regular tachycardia  Pulmonary/Chest: Effort normal and breath sounds normal.  No respiratory distress. She has no wheezes. She has no rales. She exhibits no tenderness.  Abdominal: Soft. She exhibits no distension and no mass. There is tenderness. There is rebound. There is no guarding.  Hyperactive bowel sounds  Musculoskeletal: Normal range of motion. She exhibits no edema.  Neurological: She is alert and oriented to person, place, and time.  She is oriented to name place and time  Skin: Skin is warm. No rash noted. No erythema.  Multiple bruises  Psychiatric: Affect normal.  Forgetful at times      LABORATORY PANEL:   CBC  Recent Labs Lab 09/16/16 0900  WBC 13.0*  HGB 13.2  HCT 38.2  PLT 875*   ------------------------------------------------------------------------------------------------------------------  Chemistries   Recent Labs Lab 09/16/16 0900  NA 134*  K 4.4  CL 93*  CO2 29  GLUCOSE 171*  BUN 15  CREATININE 0.81  CALCIUM 9.2  AST 29  ALT 21  ALKPHOS 83  BILITOT 0.6   ------------------------------------------------------------------------------------------------------------------  Cardiac Enzymes  Recent Labs Lab 09/16/16 0900  TROPONINI 0.05*   ------------------------------------------------------------------------------------------------------------------  RADIOLOGY:  Dg Abd 2 Views  Result Date: 09/16/2016 CLINICAL DATA:  Diffuse abdominal pain starting this morning, constipation and abdominal pain for several months but severely worse today, history gastritis, MI, former smoker EXAM: ABDOMEN - 2 VIEW COMPARISON:  08/31/2016 FINDINGS: Lung bases clear. Increased stool throughout colon. Nonobstructive bowel gas pattern. No bowel dilatation or bowel wall thickening. No free intraperitoneal air. Osseous demineralization with biconvex thoracolumbar scoliosis and note of a LEFT hip prosthesis. Scattered costal cartilaginous calcifications without definite urinary tract calcifications. IMPRESSION: Increased stool throughout  colon. Otherwise negative exam. Electronically Signed   By: Lavonia Dana M.D.   On: 09/16/2016 09:36    EKG:  Sinus tachycardia with incomplete right bundle branch block. Of note all the patient's T waves are upright a week ago EKG shows downgoing T waves.  IMPRESSION AND PLAN:   83 year old female with moderate dementia and essential hypertension who presents with abdominal pain. Patient was here a week ago with similar complaint found to have constipation and underwent CTA which was negative for ischemia and had bowel movement during her stay today in the emergency room however still continues to have abdominal pain.  1. Abdominal pain: We will repeat CT to evaluate for bowel perforation/mesenteric ischemia given elevated lactic acid level and physical exam findings. We will also need to evaluate for cardiac etiology given abnormal EKG/T-wave changes. Follow troponins Follow telemetry Cardiology consultation. Case d/w Dr Ubaldo Glassing Echocardiogram Follow-up on CTA Continue bowel regimen Consider surgery evaluation depending on CT scan.  2. Sepsis with leukocytosis and tachycardia due to urinary tract infection Follow lactic acid level Continue Rocephin  3. Elevated troponin possibly due to unstable angina/non-STEMI Follow troponins  4. Thrombocytosis: This appears to be a new finding from [facility] Oncology evaluation  ?? Is this playing role and abdominal pain??  5. Essential hypertension: Continue HCTZ and metoprolol  6. Depression: Continue Zoloft  7. Dementia: Patient appears to be at baseline          All the records are reviewed and case discussed with ED provider. Management plans discussed with the patient and daughter in law and they are in agreement  CODE STATUS: DNR  TOTAL TIME TAKING CARE OF THIS PATIENT: 48 minutes.    Caresse Sedivy M.D on 09/16/2016 at 11:50 AM  Between 7am to 6pm - Pager - [phone]  After 6pm go to [email or web address] - password [email or web address]  [facility]  Office  [phone]  CC: Primary care physician; Rusty Aus, MD

## 2016-09-17 ENCOUNTER — Encounter: Payer: Self-pay | Admitting: Surgery

## 2016-09-17 ENCOUNTER — Inpatient Hospital Stay
Admit: 2016-09-17 | Discharge: 2016-09-17 | Disposition: A | Discharge status: Home / Self Care | Payer: Medicare Other | Attending: Surgery | Admitting: Surgery

## 2016-09-17 LAB — BASIC METABOLIC PANEL
ANION GAP: 8 (ref 5–15)
BUN: 26 mg/dL — AB (ref 6–20)
CHLORIDE: 106 mmol/L (ref 101–111)
CO2: 21 mmol/L — ABNORMAL LOW (ref 22–32)
Calcium: 7.3 mg/dL — ABNORMAL LOW (ref 8.9–10.3)
Creatinine, Ser: 1.55 mg/dL — ABNORMAL HIGH (ref 0.44–1.00)
GFR calc Af Amer: 33 mL/min — ABNORMAL LOW (ref >60)
GFR calc non Af Amer: 28 mL/min — ABNORMAL LOW (ref >60)
Glucose, Bld: 179 mg/dL — ABNORMAL HIGH (ref 65–99)
POTASSIUM: 4.5 mmol/L (ref 3.5–5.1)
SODIUM: 135 mmol/L (ref 135–145)

## 2016-09-17 LAB — CBC
HEMATOCRIT: 32 % — AB (ref 35.0–47.0)
HEMOGLOBIN: 10.6 g/dL — AB (ref 12.0–16.0)
MCH: 29.4 pg (ref 26.0–34.0)
MCHC: 33 g/dL (ref 32.0–36.0)
MCV: 89 fL (ref 80.0–100.0)
Platelets: 538 10*3/uL — ABNORMAL HIGH (ref 150–440)
RBC: 3.6 MIL/uL — ABNORMAL LOW (ref 3.80–5.20)
RDW: 14 % (ref 11.5–14.5)
WBC: 18.9 10*3/uL — AB (ref 3.6–11.0)

## 2016-09-17 LAB — ECHOCARDIOGRAM COMPLETE
HEIGHTINCHES: 64 in
WEIGHTICAEL: 1626.11 [oz_av]

## 2016-09-17 LAB — TROPONIN I
Troponin I: 0.07 ng/mL (ref <0.03)
Troponin I: 0.08 ng/mL (ref <0.03)

## 2016-09-17 MED ORDER — MORPHINE SULFATE (PF) 2 MG/ML IV SOLN
2.0000 mg | INTRAVENOUS | Status: DC | PRN
Start: 2016-09-17 — End: 2016-09-21
  Administered 2016-09-17 – 2016-09-19 (×3): 2 mg via INTRAVENOUS
  Filled 2016-09-17 (×3): qty 1

## 2016-09-17 MED ORDER — PIPERACILLIN-TAZOBACTAM 3.375 G IVPB
3.3750 g | Freq: Two times a day (BID) | INTRAVENOUS | Status: DC
  Administered 2016-09-17 – 2016-09-18 (×2): 3.375 g via INTRAVENOUS
  Filled 2016-09-17 (×2): qty 50

## 2016-09-17 MED ORDER — SODIUM CHLORIDE 0.9 % IV BOLUS (SEPSIS)
500.0000 mL | Freq: Once | INTRAVENOUS | Status: AC
  Administered 2016-09-17: 500 mL via INTRAVENOUS

## 2016-09-17 MED ORDER — SODIUM CHLORIDE 0.9 % IV SOLN
INTRAVENOUS | Status: DC
  Administered 2016-09-17: 15:00:00 via INTRAVENOUS

## 2016-09-17 MED ORDER — ENOXAPARIN SODIUM 30 MG/0.3ML ~~LOC~~ SOLN
30.0000 mg | SUBCUTANEOUS | Status: DC
  Administered 2016-09-18 – 2016-09-20 (×3): 30 mg via SUBCUTANEOUS
  Filled 2016-09-17 (×3): qty 0.3

## 2016-09-17 NOTE — Progress Notes (Signed)
Pharmacy Antibiotic Note  Misty Lucas is a 83 y.o. female admitted on 09/16/2016 with an intra-abdominal infection. Pharmacy has been consulted for Zosyn dosing. Patient received metronidazole for surgical prophylaxis (exploratory laparotomy for perforated viscous) and ceftriaxone for UTI in the ED.   Plan: Continue Zosyn 3.375g IV q12h (4 hour infusion)   Height: 5\' 4"  (162.6 cm) Weight: 101 lb 10.1 oz (46.1 kg) IBW/kg (Calculated) : 54.7  Temp (24hrs), Avg:98.1 F (36.7 C), Min:97.8 F (36.6 C), Max:98.4 F (36.9 C)   Recent Labs Lab 09/16/16 0900 09/16/16 1023 09/17/16 0726  WBC 13.0*  --  18.9*  CREATININE 0.81  --  1.55*  LATICACIDVEN  --  2.2*  --     Estimated Creatinine Clearance: 17.6 mL/min (A) (by C-G formula based on SCr of 1.55 mg/dL (H)).    Allergies  Allergen Reactions  . Carafate [Sucralfate] Other (See Comments)    Unknown, pt does not remember  . Lipitor [Atorvastatin] Other (See Comments)    Unknown, pt can't remember   . Mobic [Meloxicam] Other (See Comments)    Pt does not remember  . Penicillins Other (See Comments)    Pt does not remember  . Tramadol Other (See Comments)    Pt does not remember    Antimicrobials this admission: 9/6 Ceftriaxone >> 9/6 9/6 Metronidazole >> 9/6 9/6 Zosyn >>  Dose adjustments this admission: 9/7 Zosyn transitioned to q12h   Microbiology results: 9/6 UCx: sent 9/6 MRSA PCR: negtive  Thank you for allowing pharmacy to be a part of this patient's care.  Durwin Reges 09/17/2016 11:33 AM

## 2016-09-17 NOTE — Progress Notes (Signed)
*  PRELIMINARY RESULTS* Echocardiogram 2D Echocardiogram has been performed.  Misty Lucas 09/17/2016, 9:55 AM

## 2016-09-17 NOTE — Consult Note (Signed)
[place]  CARDIOLOGY CONSULT NOTE  Patient ID: BRECKEN WALTH MRN: [id number] DOB/AGE: Jun 06, 1924 83 y.o.  Admit date: 09/16/2016 Referring Physician Vianne Bulls Primary Physician Emily Filbert Primary Cardiologist   Reason for Consultation chest pain  HPI: Pt is a 83 yo female with history of hyperlipidemia, rbbb and probable cad who was admitted after developing persistent constipation and abdominal pain and was noted to have fecal impaction. She is a difficult historian due to memory issues and dementia. Chart suggests posible chest pain and ekg shows nsr with nssttw changes. She has ruled out for an mi. She is hemodynamically stable.She underwent disimpaction and has some improvement in her discomfort. Echo is pending.   Review of Systems  Unable to perform ROS: Dementia    Past Medical History:  Diagnosis Date  . Arthritis   . Blood clot embolism during pregnancy, antepartum 1957   left thigh  . Fibrocystic breast    left breast  . Gastritis   . History of arthroscopy of knee 1980's   right  . Hyperlipemia   . Lumbar stenosis    2 buldging disc  . Myocardial infarction [facility]) 2008    Family History  Problem Relation Age of Onset  . Heart attack Mother   . Tuberculosis Father   . Dementia Father     Social History   Social History  . Marital status: Widowed    Spouse name: N/A  . Number of children: N/A  . Years of education: N/A   Occupational History  . Not on file.   Social History Main Topics  . Smoking status: Former Smoker    Types: Cigarettes    Quit date: 03/26/1967  . Smokeless tobacco: Never Used  . Alcohol use No  . Drug use: No  . Sexual activity: Not on file   Other Topics Concern  . Not on file   Social History Narrative   Lives at [place]    Past Surgical History:  Procedure Laterality Date  . ABDOMINAL HYSTERECTOMY    . APPENDECTOMY    . BREAST SURGERY    . CARPAL TUNNEL RELEASE  Bilateral 2007  . GUM SURGERY  1980's  . HIP ARTHROPLASTY Left 08/14/2016   Procedure: ARTHROPLASTY BIPOLAR HIP (HEMIARTHROPLASTY);  Surgeon: Lovell Sheehan, MD;  Location: [facility] ORS;  Service: Orthopedics;  Laterality: Left;  . KNEE ARTHROSCOPY Bilateral 1980's  . LAPAROTOMY N/A 09/16/2016   Procedure: EXPLORATORY LAPAROTOMY, DISTAL GASTRECTOMY WITH OVERSEWING OF STOMACH, GASTROJEJUNOSTOMY, PLACEMENT OF DUODENAL DRAIN AND GJTUBE;  Surgeon: Olean Ree, MD;  Location: [facility] ORS;  Service: General;  Laterality: N/A;     Prescriptions Prior to Admission  Medication Sig Dispense Refill Last Dose  . acetaminophen (TYLENOL) 500 MG tablet Take 1,000 mg by mouth 3 (three) times daily.    09/15/2016 at 2000  . aspirin EC 325 MG EC tablet Take 1 tablet (325 mg total) by mouth daily with breakfast. 30 tablet 1 09/15/2016 at 0800  . diclofenac (VOLTAREN) 50 MG EC tablet Take 50 mg by mouth 2 (two) times daily.   09/15/2016 at 2000  . docusate sodium (COLACE) 100 MG capsule Take 200 mg by mouth at bedtime.    09/15/2016 at 2000  . hydrochlorothiazide (HYDRODIURIL) 25 MG tablet Take 25 mg by mouth daily.   09/15/2016 at 0800  . metoprolol tartrate (LOPRESSOR) 25 MG tablet Take 1 tablet (25 mg total) by mouth 2 (two) times daily. 60 tablet 0  09/15/2016 at 2000  . Multiple Vitamins-Minerals (CENTRUM SILVER ULTRA WOMENS PO) Take 1 tablet by mouth every morning.   09/15/2016 at 0800  . oxyCODONE (OXY IR/ROXICODONE) 5 MG immediate release tablet Take 1 tablet (5 mg total) by mouth every 6 (six) hours as needed for breakthrough pain. 20 tablet 0 PRN at PRN  . Oyster Shell (OYSTER CALCIUM) 500 MG TABS tablet Take 500 mg of elemental calcium by mouth daily.   09/15/2016 at 0800  . senna (SENOKOT) 8.6 MG TABS tablet Take 1 tablet by mouth daily.    09/15/2016 at 0800  . sertraline (ZOLOFT) 25 MG tablet Take 25 mg by mouth daily.   Not Taking at Unknown time    Physical Exam: Blood pressure (!) 117/57, pulse 98, temperature 98.4 F  (36.9 C), temperature source Oral, resp. rate (!) 21, height 5\' 4"  (1.626 m), weight 46.1 kg (101 lb 10.1 oz), SpO2 100 %.   Wt Readings from Last 1 Encounters:  09/17/16 46.1 kg (101 lb 10.1 oz)     General appearance: slowed mentation Head: Normocephalic, without obvious abnormality, atraumatic Resp: rhonchi bibasilar Cardio: regular rate and rhythm GI: soft, non-tender; bowel sounds normal; no masses,  no organomegaly Extremities: extremities normal, atraumatic, no cyanosis or edema Neurologic: Grossly normal  Labs:   Lab Results  Component Value Date   WBC 18.9 (H) 09/17/2016   HGB 10.6 (L) 09/17/2016   HCT 32.0 (L) 09/17/2016   MCV 89.0 09/17/2016   PLT 538 (H) 09/17/2016    Recent Labs Lab 09/16/16 0900 09/17/16 0726  NA 134* 135  K 4.4 4.5  CL 93* 106  CO2 29 21*  BUN 15 26*  CREATININE 0.81 1.55*  CALCIUM 9.2 7.3*  PROT 8.2*  --   BILITOT 0.6  --   ALKPHOS 83  --   ALT 21  --   AST 29  --   GLUCOSE 171* 179*   Lab Results  Component Value Date   TROPONINI 0.08 ([place]) 09/17/2016      Radiology: increased stool in colon EKG: sinus arrhythmia  ASSESSMENT AND PLAN:  Pt admitted with fecal impaction requiring disimpaciton. Had some chest pain but is a very difficult historian. Appears to have ruled out for an mi. Trivial troponin elevaiton. EKG changes do not appear signficant. Will follow. No further cardiac workup indicated. EF normal per echo.  Signed: Teodoro Spray MD, [facility] 09/17/2016, 11:35 AM

## 2016-09-17 NOTE — NC FL2 (Signed)
  [place] LEVEL OF CARE SCREENING TOOL     IDENTIFICATION  Patient Name: Misty Lucas Birthdate: Jan 11, 1925 Sex: female Admission Date (Current Location): 09/16/2016  [place] and [place] Number:  Engineering geologist and Address:  [facility], [address], [place], [place] [address]      Provider Number: [id number]  Attending Physician Name and Address:  Epifanio Lesches, MD  Relative Name and Phone Number:       Current Level of Care: Hospital Recommended Level of Care: [place] Prior Approval Number:    Date Approved/Denied:   PASRR Number:    Discharge Plan: SNF    Current Diagnoses: Patient Active Problem List   Diagnosis Date Noted  . Sepsis ([place]) 09/16/2016  . Perforation bowel ([place])   . Closed left hip fracture ([place]) 08/13/2016    Orientation RESPIRATION BLADDER Height & Weight     Self  Normal, O2 (2 liters) Incontinent Weight: 101 lb 10.1 oz (46.1 kg) Height:  5\' 4"  (162.6 cm)  BEHAVIORAL SYMPTOMS/MOOD NEUROLOGICAL BOWEL NUTRITION STATUS   (none)  (none) Incontinent Feeding tube, Diet (currently npo)  AMBULATORY STATUS COMMUNICATION OF NEEDS Skin   Extensive Assist Verbally Surgical wounds                       Personal Care Assistance Level of Assistance  Bathing, Dressing Bathing Assistance: Maximum assistance   Dressing Assistance: Maximum assistance     Functional Limitations Info  Sight, Hearing Sight Info: Impaired Hearing Info: Impaired      SPECIAL CARE FACTORS FREQUENCY  PT (By licensed PT)                    Contractures Contractures Info: Not present    Additional Factors Info  Code Status Code Status Info: dnr             Current Medications (09/17/2016):  This is the current hospital active medication list Current Facility-Administered Medications  Medication Dose Route Frequency Provider Last Rate Last Dose  . 0.9 %  sodium chloride infusion    Intravenous Continuous Epifanio Lesches, MD 75 mL/hr at 09/17/16 1513    . [START ON 09/18/2016] enoxaparin (LOVENOX) injection 30 mg  30 mg Subcutaneous Q24H Piscoya, Jose, MD      . hydrALAZINE (APRESOLINE) injection 10 mg  10 mg Intravenous Q6H PRN Bettey Costa, MD      . lactated ringers infusion   Intravenous Continuous Olean Ree, MD 125 mL/hr at 09/17/16 1405    . morphine 2 MG/ML injection 2 mg  2 mg Intravenous Q4H PRN Piscoya, Jose, MD      . ondansetron (ZOFRAN) tablet 4 mg  4 mg Oral Q6H PRN Mody, Sital, MD       Or  . ondansetron (ZOFRAN) injection 4 mg  4 mg Intravenous Q6H PRN Mody, Sital, MD      . pantoprazole (PROTONIX) injection 40 mg  40 mg Intravenous Q12H Mody, Sital, MD   40 mg at 09/17/16 1027  . piperacillin-tazobactam (ZOSYN) IVPB 3.375 g  3.375 g Intravenous Q12H Epifanio Lesches, MD         Discharge Medications: Please see discharge summary for a list of discharge medications.  Relevant Imaging Results:  Relevant Lab Results:   Additional Information ss: [id number]  Shela Leff, LCSW

## 2016-09-17 NOTE — Progress Notes (Signed)
[facility] NAME: Misty Lucas    MR#:  [id number]  DATE OF BIRTH:  25-Nov-1924  Admitted for abdominal pain and found t duodenal perforation status post surgery. Now has confusion  CHIEF COMPLAINT:   Chief Complaint  Patient presents with  . Abdominal Pain  . Constipation    REVIEW OF SYSTEMS:   Review of Systems  Unable to perform ROS: Mental acuity   confuse DRUG ALLERGIES:   Allergies  Allergen Reactions  . Carafate [Sucralfate] Other (See Comments)    Unknown, pt does not remember  . Lipitor [Atorvastatin] Other (See Comments)    Unknown, pt can't remember   . Mobic [Meloxicam] Other (See Comments)    Pt does not remember  . Penicillins Other (See Comments)    Pt does not remember  . Tramadol Other (See Comments)    Pt does not remember    VITALS:  Blood pressure (!) 117/57, pulse 98, temperature 97.9 F (36.6 C), temperature source Oral, resp. rate (!) 21, height 5\' 4"  (1.626 m), weight 46.1 kg (101 lb 10.1 oz), SpO2 100 %.  PHYSICAL EXAMINATION:  GENERAL:  83 y.o.-year-old patient lying in the bed with no acute distress. Very confused and mumbling. EYES: Pupils equal, round, reactive to light  . No scleral icterus.  HEENT: Head atraumatic, normocephalic. Oropharynx and nasopharynx clear.  NECK:  Supple, no jugular venous distention. No thyroid enlargement, no tenderness.  LUNGS: Normal breath sounds bilaterally, no wheezing, rales,rhonchi or crepitation. No use of accessory muscles of respiration.  CARDIOVASCULAR: S1, S2 normal. No murmurs, rubs, or gallops.  ABDOMEN;midline incision is clean under dressing. Right-sided JP drain has blood-tinged fluid. Left JP   drain  Is serosanguineous.duodenal  Drain is bilious.  EXTREMITIES: No pedal edema, cyanosis, or clubbing.  NEUROLOGIC: Cranial nerves II through XII are intact. Muscle strength 5/5 in all extremities. Sensation intact. Gait not checked.   PSYCHIATRIC: The patient is alert and oriented x 3.  SKIN: No obvious rash, lesion, or ulcer.    LABORATORY PANEL:   CBC  Recent Labs Lab 09/17/16 0726  WBC 18.9*  HGB 10.6*  HCT 32.0*  PLT 538*   ------------------------------------------------------------------------------------------------------------------  Chemistries   Recent Labs Lab 09/16/16 0900 09/17/16 0726  NA 134* 135  K 4.4 4.5  CL 93* 106  CO2 29 21*  GLUCOSE 171* 179*  BUN 15 26*  CREATININE 0.81 1.55*  CALCIUM 9.2 7.3*  AST 29  --   ALT 21  --   ALKPHOS 83  --   BILITOT 0.6  --    ------------------------------------------------------------------------------------------------------------------  Cardiac Enzymes  Recent Labs Lab 09/17/16 0726  TROPONINI 0.08*   ------------------------------------------------------------------------------------------------------------------  RADIOLOGY:  Dg Abd 2 Views  Result Date: 09/16/2016 CLINICAL DATA:  Diffuse abdominal pain starting this morning, constipation and abdominal pain for several months but severely worse today, history gastritis, MI, former smoker EXAM: ABDOMEN - 2 VIEW COMPARISON:  08/31/2016 FINDINGS: Lung bases clear. Increased stool throughout colon. Nonobstructive bowel gas pattern. No bowel dilatation or bowel wall thickening. No free intraperitoneal air. Osseous demineralization with biconvex thoracolumbar scoliosis and note of a LEFT hip prosthesis. Scattered costal cartilaginous calcifications without definite urinary tract calcifications. IMPRESSION: Increased stool throughout colon. Otherwise negative exam. Electronically Signed   By: Lavonia Dana M.D.   On: 09/16/2016 09:36   Ct Angio Abd/pel W/ And/or W/o  Result Date: 09/16/2016 CLINICAL DATA:  83 year old female with a history of ongoing  abdominal pain EXAM: CTA ABDOMEN AND PELVIS WITH CONTRAST TECHNIQUE: Multidetector CT imaging of the abdomen and pelvis was performed using the  standard protocol during bolus administration of intravenous contrast. Multiplanar reconstructed images and MIPs were obtained and reviewed to evaluate the vascular anatomy. CONTRAST:  80 cc Isovue 370 COMPARISON:  None. FINDINGS: VASCULAR Aorta: Mild tortuosity of the lower thoracic and abdominal aorta. No aneurysm. No dissection flap. Scattered calcifications of the abdominal aorta. No periaortic fluid. Celiac: Moderate atherosclerotic changes at the celiac artery origin which is patent. Branch vessels are patent. SMA: Moderate atherosclerotic changes at the origin of the superior mesenteric artery which remains patent. Renals: Single bilateral renal arteries with mild atherosclerotic changes at the origin of the left renal artery. IMA: Inferior mesenteric artery is patent. Right lower extremity: Tortuous right-sided iliac system, with a regular beading appearance of the proximal external iliac artery, unchanged from prior. No significant stenosis. No dissection. Proximal femoral vasculature patent. Hypogastric artery patent. Left lower extremity: Tortuous left-sided iliac system. Hypogastric artery patent. Proximal femoral vasculature patent. Veins: Unremarkable appearance of the venous system. Review of the MIP images confirms the above findings. NON-VASCULAR Lower chest: Unremarkable. Hepatobiliary: Unremarkable appearance of liver parenchyma. Unremarkable gallbladder. Pancreas: Unremarkable pancreas Spleen: Unremarkable spleen Adrenals/Urinary Tract: Unremarkable adrenal glands Right: No hydronephrosis. Symmetric perfusion to the left. No nephrolithiasis. Unremarkable course of the right ureter. Left: No hydronephrosis. Symmetric perfusion to the right. No nephrolithiasis. Unremarkable course of the left ureter. Unremarkable appearance of the urinary bladder . Stomach/Bowel: Small hiatal hernia. Circumferential thickening of the very pyloric region, distal stomach, with focal gaseous outpouching on the  anterior wall of the proximal duodenum compatible with perforation of ulcer. This is in direct communication to free air on the anterior surface of the liver. Relatively decompressed small bowel. Moderate to large stool burden throughout the length of the colon. Fluid filled distal colon with no significant burden of formed stool in the rectum. No transition. Appendix not identified. No significant inflammatory changes adjacent to the cecum. Lymphatic: No lymphadenopathy. Mesenteric: Significant volume of free air within the abdomen. Small foci in the hepatic hilum and adjacent to the proximal duodenum, compatible with proximal small bowel perforation. Free air layers inferiorly along the anterior abdominal wall to the lower abdomen/ pelvis. Small amount of free fluid within the dependent abdomen. Reproductive: Hysterectomy Other: No abdominal hernia. Musculoskeletal: Avulsion fracture of the left anterior inferior iliac spine. Multilevel degenerative changes of the visualized thoracolumbar spine. S-shaped curvature, with right apex curvature of the lumbar spine centered at L3. Associated degenerative disc disease. Surgical changes of left hip arthroplasty IMPRESSION: Acute peri-pyloric/proximal duodenum ulcer perforation with free peritoneal air. There is associated inflammatory changes of proximal duodenum and distal stomach. These preliminary results were called by telephone at the time of interpretation on 09/16/2016 at 12:43 pm to Dr. Lenise Arena , who verbally acknowledged these results. Atherosclerotic changes of the abdominal aorta and mesenteric vessels, without high-grade stenosis of celiac artery, superior mesenteric artery, or inferior mesenteric artery. Tortuous bilateral iliac system. Irregular beaded appearance of the proximal right external iliac artery, potentially representing fibromuscular dysplasia, although an unusual distribution. Avulsion fracture of the left anterior inferior iliac spine,  present on the comparison CT. Left hip arthroplasty. Electronically Signed   By: Corrie Mckusick D.O.   On: 09/16/2016 12:54    EKG:   Orders placed or performed during the hospital encounter of 09/16/16  . EKG 12-Lead  . EKG 12-Lead    ASSESSMENT AND  PLAN:  #1 abdominal pain secondary to bowel perforation status post emergency surgery for duodenal perforation, patient had gastrectomy with gastrojejunostomy with the duodenaldrain.'continue aggressive IV hydration.Patient has acute delirium on top of her dementia. She pulled out NG tube.;Continue nothing by mouth. Surgery is following o see if she can be started on tube fee via  JEJUNAL  component over the weekend. #2 sepsis secondary to #1. #3 elevated troponin secondary to #1. #4 .Alzheimer's dementia.;CODE STATUS isDNR     All the records are reviewed and case discussed with Care Management/Social Workerr. Management plans discussed with the patient, family and they are in agreement.  CODE STATUS: DNR TOTAL TIME TAKING CARE OF THIS PATIENT: 35 minutes.   POSSIBLE D/C IN 1-2DAYS, DEPENDING ON CLINICAL CONDITION.   Epifanio Lesches M.D on 09/17/2016 at 2:01 PM  Between 7am to 6pm - Pager - [phone]  After 6pm go to [email or web address] - password [email or web address] [facility]  Office  [phone]  CC: Primary care physician; Rusty Aus, MD   Note: This dictation was prepared with Dragon dictation along with smaller phrase technology. Any transcriptional errors that result from this process are unintentional.

## 2016-09-17 NOTE — Progress Notes (Signed)
Extubated the patient.  Patient doing well.   [facility]

## 2016-09-17 NOTE — Progress Notes (Signed)
Initial Nutrition Assessment  DOCUMENTATION CODES:   Severe malnutrition in context of chronic illness, Underweight  INTERVENTION:  When able to use jejunal tube for feeding (per surgeon), recommend initiating Osmolite 1.2 at 15 ml/hr. If patient is tolerating feeds can slowly advance by 20 ml/hr Q12 hrs to goal regimen of Osmolite 1.2 at 55 ml/hr. Goal regimen provides 1584 kcal, 73 grams of protein, 1082 ml H2O daily.  Recommend free water flush of 30 ml Q6hrs. Provides total of 1202 ml H2O including water in TF.  NUTRITION DIAGNOSIS:   Malnutrition (Severe) related to chronic illness (decreased appetite with dementia and advanced age, chronic constipation, now duodenal perforation possibly from malignancy) as evidenced by severe depletion of body fat, severe depletion of muscle mass.  GOAL:   Patient will meet greater than or equal to 90% of their needs  MONITOR:   Labs, Weight trends, Skin, I & O's, TF tolerance  REASON FOR ASSESSMENT:   Ventilator    ASSESSMENT:   83 year old female with PMHx of dementia, gastritis, HLD, lumbar stenosis, hx MI 2008, arthritis, chronic constipation, surgical hx of appendectomy and abdominal hysterectomy who presented with abdominal pain admitted with sepsis and found to have pneumoperitoneum with duodenal perforation. Patient s/p exploratory laparotomy, distal gastrectomy, duodenal stump closure with omental patch, retrocolic gastrojejunostomy, Moss GJ tube placement into efferent gastrojejunostomy limb, retrograde duodenal drainage tube placement on 9/6.   -Noted patient was seen approximately one week ago in ED with abdominal pain. She was found to have large stool burden and abdominal pain subsided after she was disimpacted. -Per op report patient was found to have copious amounts of gastric content throughout abdomen. Large perforation was found in first portion of duodenum involving 50% of anterior wall. Specimens from surgery were sent to  pathology. -Patient was extubated at 0220 this AM. She pulled her NG tube out. Gastric portion of her Moss tube is to drainage. -Per surgery note today possible etiology of perforation is cancer. Pending pathology.  Spoke with patient at bedside. She is not a great historian in setting of her dementia. She reports she has not been eating much at all over the past month. She goes to the cafeteria for all of her meals, but reports she does not eat much. At breakfast she may have cereal. At lunch and dinner she may have cornbread and beans or vegetables. Does not eat much meat. Patient reporting she has not had a bowel movement for three months. However, on previous trip to ED she had BM after she was disimpacted, so this report likely inaccurate. She does have hx of constipation. Patient reports she has some difficulty chewing and swallowing, but unable to provide further details.  Patient believes she has been weight stable. Per chart back in 2016 and 2017 patient was 107-108 lbs. She was 95.9 lbs on 08/13/2016.  Medications reviewed and include: pantoprazole, LR @ 125 ml/hr, Zosyn.  Labs reviewed: CO2 21, BUN 26, Creatinine 1.55, eGFR 28, elevated Troponin.  Nutrition-Focused physical exam completed. Findings are severe fat depletion (orbital region, upper arm region, and thoracic lumbar region), moderate-severe muscle depletion (moderate depletion of posterior calf region; severe depletion of temple region, clavicle bone region, clavicle/acromion bone region, scapular bone region, dorsal hand, patellar region, anterior thigh region), and no edema.   Discussed with RN.  Also discussed nutrition plan of care with Dr. Hampton Abbot. Long term plan may be to feed patient through jejunal portion of her Moss tube. Can consider initiation of  tube feeds over the weekend to see if she tolerates. If patient is unable to tolerate tube feeds would need to consider parenteral nutrition.  Diet Order:  Diet NPO time  specified  Skin:  Wound (see comment) (closed incision to abdomen)  Last BM:  Unknown  Height:   Ht Readings from Last 1 Encounters:  09/16/16 5' 4"  (1.626 m)    Weight:   Wt Readings from Last 1 Encounters:  09/17/16 101 lb 10.1 oz (46.1 kg)    Ideal Body Weight:  54.5 kg  BMI:  Body mass index is 17.45 kg/m.  Estimated Nutritional Needs:   Kcal:  1385-1615 (30-35 kcal/kg)  Protein:  70-83 grams (1.5-1.8 grams/kg)  Fluid:  1.1-1.4 L/day (25-30 ml/kg)  EDUCATION NEEDS:   No education needs identified at this time  Willey Blade, MS, RD, LDN Pager: [phone] After Hours Pager: [phone]

## 2016-09-17 NOTE — Progress Notes (Signed)
Pt transported to room 211 in stable condition. Family at bedside. Minette Brine, RN at bedside. All belongings with patient.

## 2016-09-17 NOTE — Clinical Social Work Note (Signed)
Clinical Social Work Assessment  Patient Details  Name: Misty Lucas MRN: [id number] Date of Birth: July 19, 1924  Date of referral:  09/17/16               Reason for consult:  Facility Placement, Discharge Planning                Permission sought to share information with:    Permission granted to share information::     Name::        Agency::     Relationship::     Contact Information:     Housing/Transportation Living arrangements for the past 2 months:  [place] of Information:  Adult Children Patient Interpreter Needed:  None Criminal Activity/Legal Involvement Pertinent to Current Situation/Hospitalization:  No - Comment as needed Significant Relationships:  Adult Children Lives with:  Facility Resident Do you feel safe going back to the place where you live?  Yes Need for family participation in patient care:  Yes (Comment)  Care giving concerns:  Patient resides at [facility].   Social Worker assessment / plan:  CSW informed that patient will now have a PEG tube for feedings. Patient will not be able to return to [facility] with this and will need a higher level of care. CSW spoke with patient's son Eustolia Drennen: [phone] after he came to the hospital to visit with his mother. Mr. Molock is aware that patient will not be able to return to [facility] and informed CSW that patient broke her hip a few weeks ago and went to [facility] for rehab. He stated that they do not want [facility]. Currently bed search not initiated but FL2 completed and will send out once she has progressed more.   Employment status:  Retired Nurse, adult PT Recommendations:  Not assessed at this time Information / Referral to community resources:     Patient/Family's Response to care:  Patient's son expressed appreciation for CSW assistance.  Patient/Family's Understanding of and Emotional Response to  Diagnosis, Current Treatment, and Prognosis:  Patient's son is concerned that his mom will not come back from this incident and is wanting to wait and see how she progresses. CSW offered emotional support.   Emotional Assessment Appearance:  Appears stated age Attitude/Demeanor/Rapport:   (pleasantly confused) Affect (typically observed):    Orientation:  Oriented to Self Alcohol / Substance use:  Not Applicable Psych involvement (Current and /or in the community):  No (Comment)  Discharge Needs  Concerns to be addressed:  Care Coordination Readmission within the last 30 days:  No Current discharge risk:  None Barriers to Discharge:  No Barriers Identified   Shela Leff, LCSW 09/17/2016, 4:38 PM

## 2016-09-17 NOTE — Anesthesia Postprocedure Evaluation (Signed)
Anesthesia Post Note  Patient: Misty Lucas  Procedure(s) Performed: Procedure(s) (LRB): EXPLORATORY LAPAROTOMY, DISTAL GASTRECTOMY WITH OVERSEWING OF STOMACH, GASTROJEJUNOSTOMY, PLACEMENT OF DUODENAL DRAIN AND GJTUBE (N/A)  Patient location during evaluation: ICU Anesthesia Type: General Level of consciousness: awake Pain management: pain level controlled Vital Signs Assessment: post-procedure vital signs reviewed and stable Respiratory status: spontaneous breathing and patient connected to nasal cannula oxygen Cardiovascular status: blood pressure returned to baseline and stable Postop Assessment: no headache Comments: Disoriented.  Thinks she is going to have a baby.  Denies pain.  Unaware of year or place.     Last Vitals:  Vitals:   09/17/16 0400 09/17/16 0500  BP: 93/83 (!) 87/67  Pulse: 98 (!) 104  Resp: 16 (!) 21  Temp:    SpO2: 100% 100%    Last Pain:  Vitals:   09/17/16 0000  TempSrc: Oral  PainSc:                  [name]

## 2016-09-17 NOTE — Care Management (Signed)
Patient now has a feeding tube (not currently used) and a drain tube in place from [facility]. CSW updated.

## 2016-09-17 NOTE — Progress Notes (Signed)
09/17/2016  Subjective: Patient is 1 Day Post-Op s/p exploratory laparotomy, distal gastrectomy, gastrojejunostomy, duodenal stump closure with omental patch, duodenal drainage tube placement, moss feeding tube placement.  Overnight, patient required bolus of fluid given low BP and UOP.  Extubated overnight.  This morning still having low UOP.  Also have delirium from her baseline dementia and pulled her NG tube.  Vital signs: Temp:  [97.8 F (36.6 C)-98.4 F (36.9 C)] 97.9 F (36.6 C) (09/07 1200) Pulse Rate:  [92-104] 98 (09/07 0900) Resp:  [14-25] 21 (09/07 0900) BP: (72-130)/(38-83) 117/57 (09/07 0900) SpO2:  [100 %] 100 % (09/07 0900) FiO2 (%):  [40 %] 40 % (09/06 2346) Weight:  [44.7 kg (98 lb 8.7 oz)-46.1 kg (101 lb 10.1 oz)] 46.1 kg (101 lb 10.1 oz) (09/07 0500)   Intake/Output: 09/06 0701 - 09/07 0700 In: 2350 [I.V.:2300; IV Piggyback:50] Out: 26 [Urine:170; Drains:210; Blood:50]    Physical Exam: Constitutional: No acute distress, mildly agitated refusing my exam. Cardiac:  Low grade tachycardia with agitation, but sinus rhythm.   Pulm:  On [place], no respiratory distress Abdomen:  Soft, non-distended, unable to do further exam.  Her midline incision appears clean under dressing.  Her right sided JP drain has bile tinged fluid but not frankly bilious.  Her left JP drain is serosanguinous.  The duodenal drain is bilious as expected.    Labs:   Recent Labs  09/16/16 0900 09/17/16 0726  WBC 13.0* 18.9*  HGB 13.2 10.6*  HCT 38.2 32.0*  PLT 875* 538*    Recent Labs  09/16/16 0900 09/17/16 0726  NA 134* 135  K 4.4 4.5  CL 93* 106  CO2 29 21*  GLUCOSE 171* 179*  BUN 15 26*  CREATININE 0.81 1.55*  CALCIUM 9.2 7.3*   No results for input(s): LABPROT, INR in the last 72 hours.  Imaging: No results found.  Assessment/Plan: 83 yo female s/p exlap for duodenal perforation.  --pending pathology report, but this appeared to be cancer in etiology intraoperatively.    --duodenal closure may not be holding well if there is bile tinge output on JP drain, vs any residual bile fluid in the right upper quadrant.  Will continue to monitor. --Continue NPO for now and have Moss tube gastric portion to gravity drainage.  Duodenal tube to gravity drainage as well.  May be able to start tube feeds via jejunal component over the weekend. --abdominal binder to protect her tube/drains. --will give another 500 ml bolus this morning for low uop and increase her fluids to 125 ml/hr given her acute renal failure on labs today. --may transfer to floor per ICU recs.  Will likely need a sitter due to her dementia.  Will avoid benzos as these may exacerbate the delirium.  May need psych consult in future if no improvement. --f/u echo results for her mildly elevated troponin.  Likely due to the stress of her perforation but appreciate cardiology's input. --discussed the severity of her disease with her son last night.  Will add palliative care consult today.   Melvyn Neth, [place]

## 2016-09-17 NOTE — Progress Notes (Signed)
Pt to unit from surgery. Pt MAP decreased to 49 NP aware, 1L bolus given, pt pressure increased. Urine output very minimal also pt pushed up NG with tongue into oral cavity, NG removed, Dr.Woodam aware came by to see pt, no new orders at this time.  Pt becoming more alert and following commands. SBP done on no sedation, pt extubated 0220 by NP, writing RN at beside, pt on 4LNC. Pt stable, will continue to monitor. Urine output still minimal.

## 2016-09-18 LAB — URINE CULTURE: Culture: >=100000 — AB

## 2016-09-18 LAB — BASIC METABOLIC PANEL
Anion gap: 6 (ref 5–15)
BUN: 29 mg/dL — ABNORMAL HIGH (ref 6–20)
CALCIUM: 7.6 mg/dL — AB (ref 8.9–10.3)
CO2: 23 mmol/L (ref 22–32)
Chloride: 109 mmol/L (ref 101–111)
Creatinine, Ser: 1.09 mg/dL — ABNORMAL HIGH (ref 0.44–1.00)
GFR calc Af Amer: 50 mL/min — ABNORMAL LOW (ref >60)
GFR, EST NON AFRICAN AMERICAN: 43 mL/min — AB (ref >60)
GLUCOSE: 101 mg/dL — AB (ref 65–99)
Potassium: 3.9 mmol/L (ref 3.5–5.1)
Sodium: 138 mmol/L (ref 135–145)

## 2016-09-18 LAB — CBC WITH DIFFERENTIAL/PLATELET
BASOS ABS: 0 10*3/uL (ref 0–0.1)
Basophils Relative: 0 %
EOS ABS: 0 10*3/uL (ref 0–0.7)
Eosinophils Relative: 0 %
HCT: 27.3 % — ABNORMAL LOW (ref 35.0–47.0)
Hemoglobin: 9.3 g/dL — ABNORMAL LOW (ref 12.0–16.0)
LYMPHS ABS: 0.5 10*3/uL — AB (ref 1.0–3.6)
LYMPHS PCT: 3 %
MCH: 29.4 pg (ref 26.0–34.0)
MCHC: 34 g/dL (ref 32.0–36.0)
MCV: 86.5 fL (ref 80.0–100.0)
MONO ABS: 1.2 10*3/uL — AB (ref 0.2–0.9)
Monocytes Relative: 6 %
Neutro Abs: 17.8 10*3/uL — ABNORMAL HIGH (ref 1.4–6.5)
Neutrophils Relative %: 91 %
PLATELETS: 522 10*3/uL — AB (ref 150–440)
RBC: 3.16 MIL/uL — ABNORMAL LOW (ref 3.80–5.20)
RDW: 14.4 % (ref 11.5–14.5)
WBC: 19.6 10*3/uL — ABNORMAL HIGH (ref 3.6–11.0)

## 2016-09-18 LAB — MAGNESIUM: Magnesium: 1.9 mg/dL (ref 1.7–2.4)

## 2016-09-18 MED ORDER — PIPERACILLIN-TAZOBACTAM 3.375 G IVPB
3.3750 g | Freq: Three times a day (TID) | INTRAVENOUS | Status: DC
  Administered 2016-09-18 – 2016-09-29 (×33): 3.375 g via INTRAVENOUS
  Filled 2016-09-18 (×33): qty 50

## 2016-09-18 MED ORDER — MENTHOL 3 MG MT LOZG: 1.0000 | LOZENGE | OROMUCOSAL | Status: DC | PRN

## 2016-09-18 NOTE — Progress Notes (Signed)
Pharmacy Antibiotic Note  Misty Lucas is a 83 y.o. female admitted on 09/16/2016 with an intra-abdominal infection. Pharmacy has been consulted for Zosyn dosing. Patient received metronidazole for surgical prophylaxis (exploratory laparotomy for perforated viscous) and ceftriaxone for UTI in the ED.   Plan: Transition Zosyn 3.375g IV q12h to Q8H based on current renal function (CrCl >59ml/min)  Height: 5\' 4"  (162.6 cm) Weight: 106 lb 11.2 oz (48.4 kg) IBW/kg (Calculated) : 54.7  Temp (24hrs), Avg:98.1 F (36.7 C), Min:97.8 F (36.6 C), Max:98.4 F (36.9 C)   Recent Labs Lab 09/16/16 0900 09/16/16 1023 09/17/16 0726 09/18/16 0346  WBC 13.0*  --  18.9* 19.6*  CREATININE 0.81  --  1.55* 1.09*  LATICACIDVEN  --  2.2*  --   --     Estimated Creatinine Clearance: 26.2 mL/min (A) (by C-G formula based on SCr of 1.09 mg/dL (H)).    Allergies  Allergen Reactions  . Carafate [Sucralfate] Other (See Comments)    Unknown, pt does not remember  . Lipitor [Atorvastatin] Other (See Comments)    Unknown, pt can't remember   . Mobic [Meloxicam] Other (See Comments)    Pt does not remember  . Penicillins Other (See Comments)    Pt does not remember  . Tramadol Other (See Comments)    Pt does not remember    Antimicrobials this admission: 9/6 Ceftriaxone >> 9/6 9/6 Metronidazole >> 9/6 9/6 Zosyn >>  Dose adjustments this admission: 9/7 Zosyn transitioned to q12h  9/8 Zosyn transitioned to q8h  Microbiology results: 9/6 UCx: sent 9/6 MRSA PCR: negtive  Thank you for allowing pharmacy to be a part of this patient's care.  Olivia Canter, [facility] 09/18/2016 1:01 PM

## 2016-09-18 NOTE — Evaluation (Signed)
Physical Therapy Evaluation Patient Details Name: Misty Lucas MRN: [id number] DOB: Jul 22, 1924 Today's Date: 09/18/2016   History of Present Illness  Misty Lucas is a 83 y.o. female who presents with abdominal pain.  She has a history of Alzheimer's Dementia. She has been dealing with constipation issues for a long time and has been having more recently upper abdominal pain for the past week.  She had been recently seen in the emergency room and was noted to be constipated with fecal impaction. She was disimpacted and her pain at that point had subsided but now has worsened again. She was initially admitted to the medical team today with concerns for possible UTI which is positive on her urinalysis. There was a concern for possible EKG changes compared to the EKG from last week. However CT scan of the abdomen was done which showed pneumoperitoneum with possible perforated peptic ulcer. Pt underwent emergency surgery and is now POD#2 s/p exploratory laparotomy, distal gastrectomy, gastrojejunostomy, duodenal stump closure with omental patch, duodenal drainage tube placement, and moss feeding tube placement. Pt has been more confused than her baseline and is currently with a sitter. Of note pt suffered a L hip fracture in August 2018 and underwent anterior approach hemiarthroplasty at [facility]. She was discharged to [facility]. During prior admission she was WBAT with anterior precautions. She is AOx1 at time of PT evaluation. Pt unable to provide any history and family is not available so history is obtained from the medical record.   Clinical Impression  Pt admitted with above diagnosis. Pt currently with functional limitations due to the deficits listed below (see PT Problem List).  Pt is AOx1 at time of evaluation. She is mildly agitated and only follows approximately 50% of simple one step commands. Attempted log rolling with patient however she is unable to understand cues. Instead pt requires maxA+2  for bed mobility. After brief period of minA+1 at EOB she is able to maintain balance with CGA only. Verbal and tactile cues for safe hand placement during transfers. Posterior LOB with poor anterior weight shifting. Pt requires continual min to modA+1 for balance in standing. Performed marches in standing but pt with difficulty accepting weight onto LLE when raising RLE. HR increases to 130's bpm during standing. SaO2>90% on 2L/min supplemental O2. Unable/unsafe to ambulate or transfer to recliner at this time. Pt will need SNF placement at discharge in order to return to prior level of function. It is unfortunate given her age that she has had an extensive abdominal surgery on top of a significant recent repair for a L hip fracture. Pt will benefit from PT services to address deficits in strength, balance, and mobility in order to return to full function at home.     Follow Up Recommendations SNF    Equipment Recommendations  None recommended by PT    Recommendations for Other Services       Precautions / Restrictions Precautions Precautions: Fall;Anterior Hip Restrictions Weight Bearing Restrictions: Yes LLE Weight Bearing: Weight bearing as tolerated Other Position/Activity Restrictions: Pt was WBAT during last admission following L hip anterior approach hemiarthroplasty      Mobility  Bed Mobility Overal bed mobility: Needs Assistance Bed Mobility: Supine to Sit;Sit to Supine     Supine to sit: Max assist;+2 for physical assistance Sit to supine: Max assist;+2 for physical assistance   General bed mobility comments: Attempted log rolling with patient however she is unable to understand cues. Insteady pt requires maxA+2 for bed mobility.  Once brief period of minA+1 at EOB she is able to maintain balance with CGA only  Transfers Overall transfer level: Needs assistance Equipment used: Rolling walker (2 wheeled) Transfers: Sit to/from Stand Sit to Stand: Mod assist          General transfer comment: Verbal and tactile cues for safe hand placement during transfer. Posterior LOB with poor anterior weight shifting. Pt requires continual min to modA+1 for balance in standing. Perform marches in standing but pt with difficulty accepting weight onto LLE when raising RLE. HR increases to 130's bpm during standing. SaO2>90% on 2L/min supplemental O2. Unable/unsafe to ambulate or transfer to recliner at this time  Ambulation/Gait             General Gait Details: Unsafe/unable to attempt  Stairs            Wheelchair Mobility    Modified Rankin (Stroke Patients Only)       Balance Overall balance assessment: Needs assistance Sitting-balance support: No upper extremity supported Sitting balance-Leahy Scale: Fair     Standing balance support: Bilateral upper extremity supported Standing balance-Leahy Scale: Poor                               Pertinent Vitals/Pain Pain Assessment: Faces Faces Pain Scale: Hurts even more Pain Location: Abdomen and L hip Pain Intervention(s): Monitored during session    Home Living Family/patient expects to be discharged to:: Unsure                 Additional Comments: Per medical record pt was previously at [facility]. She recently was at [facility] following hip fracture with repair. Per medical record pt will be unable to return to [facility] at discharge    Prior Function Level of Independence: Needs assistance         Comments: Living at [facility]. Pt unable to provide any information due to confusion. Likely requiring assist with ADLs/IADLs given recent debility     Hand Dominance        Extremity/Trunk Assessment   Upper Extremity Assessment Upper Extremity Assessment: RUE deficits/detail RUE Deficits / Details: Worse RUE flexion strength than L. Unclear if this is baseline. Pt follows approximately 50% of simple one step commands.     Lower Extremity  Assessment Lower Extremity Assessment: Generalized weakness;LLE deficits/detail LLE Deficits / Details: Able to SLR independently bilaterally. Difficulty accepting weight onto LLE in standing       Communication   Communication: No difficulties  Cognition Arousal/Alertness: Lethargic Behavior During Therapy: Restless Overall Cognitive Status: History of cognitive impairments - at baseline                                 General Comments: AOx1, unable to provide correct DOB. History of Alzheimer's Dementia however per medical record current confusion is worse than her baseline. Pt follows approximately 50% of simple one step commands.       General Comments      Exercises General Exercises - Lower Extremity Long Arc Quad: Both;10 reps;Seated;AROM Heel Slides: Strengthening;Both;10 reps;Supine Hip ABduction/ADduction: Strengthening;Both;10 reps;Supine Straight Leg Raises: Strengthening;Both;10 reps;Supine Hip Flexion/Marching: Strengthening;Both;10 reps;Seated   Assessment/Plan    PT Assessment Patient needs continued PT services  PT Problem List Decreased strength;Decreased activity tolerance;Decreased balance;Decreased mobility;Decreased cognition;Decreased safety awareness;Decreased knowledge of use of DME;Pain       PT  Treatment Interventions DME instruction;Gait training;Functional mobility training;Therapeutic exercise;Therapeutic activities;Balance training;Neuromuscular re-education;Cognitive remediation;Patient/family education;Manual techniques;Wheelchair mobility training    PT Goals (Current goals can be found in the Care Plan section)  Acute Rehab PT Goals PT Goal Formulation: Patient unable to participate in goal setting    Frequency Min 2X/week   Barriers to discharge Decreased caregiver support From ALF but will require more assistance at discharge    Co-evaluation               AM-PAC PT "6 Clicks" Daily Activity  Outcome Measure  Difficulty turning over in bed (including adjusting bedclothes, sheets and blankets)?: Unable Difficulty moving from lying on back to sitting on the side of the bed? : Unable Difficulty sitting down on and standing up from a chair with arms (e.g., wheelchair, bedside commode, etc,.)?: Unable Help needed moving to and from a bed to chair (including a wheelchair)?: Total Help needed walking in hospital room?: Total Help needed climbing 3-5 steps with a railing? : Total 6 Click Score: 6    End of Session Equipment Utilized During Treatment: Gait belt;Oxygen Activity Tolerance: Patient limited by lethargy Patient left: in bed;with call bell/phone within reach;with bed alarm set;with nursing/sitter in room;Other (comment) (heels floated) Nurse Communication: Mobility status;Other (comment) (IV complete) PT Visit Diagnosis: Unsteadiness on feet (R26.81);Muscle weakness (generalized) (M62.81);History of falling (Z91.81);Pain;Difficulty in walking, not elsewhere classified (R26.2) Pain - Right/Left: Left Pain - part of body: Hip (Also with abdominal pain)    Time: 1517-6160 PT Time Calculation (min) (ACUTE ONLY): 17 min   Charges:   PT Evaluation $PT Eval Moderate Complexity: 1 Mod PT Treatments $Therapeutic Exercise: 8-22 mins   PT G Codes:   PT G-Codes **NOT FOR INPATIENT CLASS** Functional Assessment Tool Used: AM-PAC 6 Clicks Basic Mobility Functional Limitation: Mobility: Walking and moving around Mobility: Walking and Moving Around Current Status (V3710): 100 percent impaired, limited or restricted Mobility: Walking and Moving Around Goal Status (G2694): At least 60 percent but less than 80 percent impaired, limited or restricted   Phillips Grout PT, DPT    [place] 09/18/2016, 10:17 AM

## 2016-09-18 NOTE — Progress Notes (Signed)
09/18/2016  Subjective: Patient is 2 Days Post-Op s/p exploratory laparotomy, distal gastrectomy, gastrojejunostomy, duodenal stump closure with omental patch, duodenal drainage tube placement, moss feeding tube placement.  Patient transferred out of ICU yesterday.  UOP has improved as well as her creatinine.  Her right sided JP drain has started to have more bilious tinge to it, but no frank bile like the expected fluid in the duodenal drain.  Required sitter overnight but this morning appears calm.  Vital signs: Temp:  [97.8 F (36.6 C)-98.4 F (36.9 C)] 98.1 F (36.7 C) (09/08 0401) Pulse Rate:  [98-121] 106 (09/08 0401) Resp:  [16-21] 16 (09/08 0401) BP: (117-167)/(57-73) 167/73 (09/08 0401) SpO2:  [79 %-100 %] 99 % (09/08 0401) Weight:  [48.4 kg (106 lb 11.2 oz)] 48.4 kg (106 lb 11.2 oz) (09/08 0401)   Intake/Output: 09/07 0701 - 09/08 0700 In: 4769 [I.V.:4727; IV Piggyback:42] Out: 7341 [Urine:670; Drains:652] Last BM Date:  (unknown)  Physical Exam: Constitutional:  No acute distress Abdomen:  Soft, nondistended, appropriately tender to palpation.  Incision is clean, dry, intact with staples and honeycomb dressing.  Right JP drain with bile tinge color.  Left JP drain with serosanguinous fluid.  Duodenal drain with expected bilious fluid in bag.  Moss tube gastric component with minimal output.  Abdominal binder to protect all tubes.  Labs:   Recent Labs  09/17/16 0726 09/18/16 0346  WBC 18.9* 19.6*  HGB 10.6* 9.3*  HCT 32.0* 27.3*  PLT 538* 522*    Recent Labs  09/17/16 0726 09/18/16 0346  NA 135 138  K 4.5 3.9  CL 106 109  CO2 21* 23  GLUCOSE 179* 101*  BUN 26* 29*  CREATININE 1.55* 1.09*  CALCIUM 7.3* 7.6*   No results for input(s): LABPROT, INR in the last 72 hours.  Imaging: No results found.  Assessment/Plan: 83 yo female s/p exploratory laparotomy, distal gastrectomy, gastrojejunostomy, duodenal stump closure with omental patch, duodenal drainage  tube placement, moss feeding tube placement.  --Continue NPO today.  Will start tube feeds via jejunal component tomorrow. --Continue IV fluid hydration.  Creatinine improving and urine output improved. --F/U palliative care consult --Will place ID consult today given persistent elevated WBC.  Since this is a peptic perforation which quite a significant amount of spillage in abdomen, may require broader abx coverage, possibly add fluconazole if ID agrees. --PT consult --Continue foley catheter today for strict In/Out.  Likely remove tomorrow as well. --Appreciate cardiology consult.  No further workup at this time.  Echo showing EF 60-65%   Melvyn Neth, [place]

## 2016-09-18 NOTE — Progress Notes (Signed)
[place] at [place] NAME: Misty Lucas    MR#:  [id number]  DATE OF BIRTH:  May 23, 1924    CHIEF COMPLAINT:   Chief Complaint  Patient presents with  . Abdominal Pain  . Constipation   Patient is altered and lethargic, opens eyes to verbal commands but not answering any questions. No family members at bedside REVIEW OF SYSTEMS:   Review of Systems  Unable to perform ROS: Mental acuity   confuse DRUG ALLERGIES:   Allergies  Allergen Reactions  . Carafate [Sucralfate] Other (See Comments)    Unknown, pt does not remember  . Lipitor [Atorvastatin] Other (See Comments)    Unknown, pt can't remember   . Mobic [Meloxicam] Other (See Comments)    Pt does not remember  . Penicillins Other (See Comments)    Pt does not remember  . Tramadol Other (See Comments)    Pt does not remember    VITALS:  Blood pressure (!) 154/65, pulse (!) 108, temperature 98 F (36.7 C), temperature source Oral, resp. rate 18, height 5\' 4"  (1.626 m), weight 48.4 kg (106 lb 11.2 oz), SpO2 99 %.  PHYSICAL EXAMINATION:  GENERAL:  83 y.o.-year-old patient lying in the bed with no acute distress. Very confused and mumbling. EYES: Pupils equal, round, reactive to light  . No scleral icterus.  HEENT: Head atraumatic, normocephalic. Oropharynx and nasopharynx clear.  NECK:  Supple, no jugular venous distention. No thyroid enlargement, no tenderness.  LUNGS: Normal breath sounds bilaterally, no wheezing, rales,rhonchi or crepitation. No use of accessory muscles of respiration.  CARDIOVASCULAR: S1, S2 normal. No murmurs, rubs, or gallops.  ABDOMEN;midline incision is clean under Honeycomb dressing. Abdominal binder is present Right-sided JP drain has blood-tinged fluid. Left JP   drain  Is serosanguineous.duodenal  Drain is bilious.  EXTREMITIES: No pedal edema, cyanosis, or clubbing.  NEUROLOGIC: Cranial nerves II through XII are intact. Muscle strength 5/5  in all extremities. Sensation intact. Gait not checked.  PSYCHIATRIC: The patient is alert and oriented x 3.  SKIN: No obvious rash, lesion, or ulcer.    LABORATORY PANEL:   CBC  Recent Labs Lab 09/18/16 0346  WBC 19.6*  HGB 9.3*  HCT 27.3*  PLT 522*   ------------------------------------------------------------------------------------------------------------------  Chemistries   Recent Labs Lab 09/16/16 0900  09/18/16 0346  NA 134*  < > 138  K 4.4  < > 3.9  CL 93*  < > 109  CO2 29  < > 23  GLUCOSE 171*  < > 101*  BUN 15  < > 29*  CREATININE 0.81  < > 1.09*  CALCIUM 9.2  < > 7.6*  MG  --   --  1.9  AST 29  --   --   ALT 21  --   --   ALKPHOS 83  --   --   BILITOT 0.6  --   --   < > = values in this interval not displayed. ------------------------------------------------------------------------------------------------------------------  Cardiac Enzymes  Recent Labs Lab 09/17/16 0726  TROPONINI 0.08*   ------------------------------------------------------------------------------------------------------------------  RADIOLOGY:  No results found.  EKG:   Orders placed or performed during the hospital encounter of 09/16/16  . EKG 12-Lead  . EKG 12-Lead    ASSESSMENT AND PLAN:  #Acute abdominal pain  secondary to bowel perforation status post emergency surgery for duodenal perforation, patient had gastrectomy with gastrojejunostomy with the duodenaldrain. continue aggressive IV hydration.Patient has acute delirium on top of her  dementia.  She pulled out NG tube.; Patient is transferred to surgery service  Continue nothing by mouth.  Management for surgery   #2 sepsis secondary to #1. Continue Zosyn and monitor blood counts   #3 elevated troponin secondary to #1.  #4 .Alzheimer's dementia.; Currently patient is altered  CODE STATUS isDNR   Patient is transferred to surgery service  All the records are reviewed and case discussed with Care  Management/Social Workerr.  CODE STATUS: DNR TOTAL TIME TAKING CARE OF THIS PATIENT: 35 minutes.   POSSIBLE D/C IN 1-2DAYS, DEPENDING ON CLINICAL CONDITION.   Misty Lucas M.D on 09/18/2016 at 4:08 PM  Between 7am to 6pm - Pager - [phone]  After 6pm go to [email or web address] - password [email or web address] [facility]  Office  [phone]  CC: Primary care physician; Rusty Aus, MD   Note: This dictation was prepared with Dragon dictation along with smaller phrase technology. Any transcriptional errors that result from this process are unintentional.

## 2016-09-19 LAB — CBC WITH DIFFERENTIAL/PLATELET
BASOS ABS: 0 10*3/uL (ref 0–0.1)
BASOS PCT: 0 %
Eosinophils Absolute: 0 10*3/uL (ref 0–0.7)
Eosinophils Relative: 0 %
HEMATOCRIT: 28.7 % — AB (ref 35.0–47.0)
HEMOGLOBIN: 9.7 g/dL — AB (ref 12.0–16.0)
Lymphocytes Relative: 3 %
Lymphs Abs: 0.6 10*3/uL — ABNORMAL LOW (ref 1.0–3.6)
MCH: 29.9 pg (ref 26.0–34.0)
MCHC: 33.9 g/dL (ref 32.0–36.0)
MCV: 88.2 fL (ref 80.0–100.0)
Monocytes Absolute: 1 10*3/uL — ABNORMAL HIGH (ref 0.2–0.9)
Monocytes Relative: 5 %
NEUTROS ABS: 19.1 10*3/uL — AB (ref 1.4–6.5)
NEUTROS PCT: 92 %
Platelets: 492 10*3/uL — ABNORMAL HIGH (ref 150–440)
RBC: 3.25 MIL/uL — AB (ref 3.80–5.20)
RDW: 14.3 % (ref 11.5–14.5)
WBC: 20.8 10*3/uL — AB (ref 3.6–11.0)

## 2016-09-19 LAB — BASIC METABOLIC PANEL
Anion gap: 6 (ref 5–15)
BUN: 22 mg/dL — AB (ref 6–20)
CALCIUM: 7.8 mg/dL — AB (ref 8.9–10.3)
CHLORIDE: 110 mmol/L (ref 101–111)
CO2: 24 mmol/L (ref 22–32)
Creatinine, Ser: 0.88 mg/dL (ref 0.44–1.00)
GFR calc non Af Amer: 56 mL/min — ABNORMAL LOW (ref >60)
GLUCOSE: 86 mg/dL (ref 65–99)
Potassium: 3.5 mmol/L (ref 3.5–5.1)
Sodium: 140 mmol/L (ref 135–145)

## 2016-09-19 LAB — PHOSPHORUS: PHOSPHORUS: 2.5 mg/dL (ref 2.5–4.6)

## 2016-09-19 LAB — GLUCOSE, CAPILLARY
GLUCOSE-CAPILLARY: 202 mg/dL — AB (ref 65–99)
Glucose-Capillary: 155 mg/dL — ABNORMAL HIGH (ref 65–99)
Glucose-Capillary: 188 mg/dL — ABNORMAL HIGH (ref 65–99)
Glucose-Capillary: 183 mg/dL — ABNORMAL HIGH (ref 65–99)

## 2016-09-19 LAB — MAGNESIUM: Magnesium: 1.9 mg/dL (ref 1.7–2.4)

## 2016-09-19 MED ORDER — FREE WATER
30.0000 mL | Freq: Four times a day (QID) | Status: DC
  Administered 2016-09-19 – 2016-09-30 (×44): 30 mL

## 2016-09-19 MED ORDER — FLUCONAZOLE IN SODIUM CHLORIDE 200-0.9 MG/100ML-% IV SOLN
200.0000 mg | INTRAVENOUS | Status: DC
  Administered 2016-09-20 – 2016-09-29 (×10): 200 mg via INTRAVENOUS
  Filled 2016-09-19 (×10): qty 100

## 2016-09-19 MED ORDER — KCL IN DEXTROSE-NACL 20-5-0.45 MEQ/L-%-% IV SOLN
INTRAVENOUS | Status: DC
  Administered 2016-09-19 – 2016-09-21 (×6): via INTRAVENOUS
  Filled 2016-09-19 (×8): qty 1000

## 2016-09-19 MED ORDER — VITAL HIGH PROTEIN PO LIQD: 1000.0000 mL | ORAL | Status: DC

## 2016-09-19 MED ORDER — FLUCONAZOLE IN SODIUM CHLORIDE 400-0.9 MG/200ML-% IV SOLN
400.0000 mg | Freq: Once | INTRAVENOUS | Status: AC
  Administered 2016-09-19: 400 mg via INTRAVENOUS
  Filled 2016-09-19: qty 200

## 2016-09-19 MED ORDER — INSULIN ASPART 100 UNIT/ML ~~LOC~~ SOLN
0.0000 [IU] | SUBCUTANEOUS | Status: DC
  Administered 2016-09-19 (×2): 2 [IU] via SUBCUTANEOUS
  Administered 2016-09-19: 3 [IU] via SUBCUTANEOUS
  Administered 2016-09-20: 1 [IU] via SUBCUTANEOUS
  Administered 2016-09-20 – 2016-09-21 (×6): 2 [IU] via SUBCUTANEOUS
  Administered 2016-09-21: 1 [IU] via SUBCUTANEOUS
  Administered 2016-09-21: 2 [IU] via SUBCUTANEOUS
  Administered 2016-09-21 (×2): 3 [IU] via SUBCUTANEOUS
  Administered 2016-09-22: 2 [IU] via SUBCUTANEOUS
  Administered 2016-09-22: 3 [IU] via SUBCUTANEOUS
  Administered 2016-09-22: 1 [IU] via SUBCUTANEOUS
  Administered 2016-09-22: 2 [IU] via SUBCUTANEOUS
  Administered 2016-09-22: 3 [IU] via SUBCUTANEOUS
  Administered 2016-09-22 – 2016-09-23 (×2): 2 [IU] via SUBCUTANEOUS
  Administered 2016-09-23: 3 [IU] via SUBCUTANEOUS
  Administered 2016-09-23: 1 [IU] via SUBCUTANEOUS
  Administered 2016-09-23: 3 [IU] via SUBCUTANEOUS
  Administered 2016-09-23: 1 [IU] via SUBCUTANEOUS
  Administered 2016-09-23: 3 [IU] via SUBCUTANEOUS
  Administered 2016-09-24 (×3): 2 [IU] via SUBCUTANEOUS
  Administered 2016-09-24: 1 [IU] via SUBCUTANEOUS
  Administered 2016-09-24 (×2): 2 [IU] via SUBCUTANEOUS
  Administered 2016-09-25 (×2): 3 [IU] via SUBCUTANEOUS
  Administered 2016-09-25: 2 [IU] via SUBCUTANEOUS
  Administered 2016-09-25 (×2): 1 [IU] via SUBCUTANEOUS
  Administered 2016-09-25: 3 [IU] via SUBCUTANEOUS
  Administered 2016-09-26: 2 [IU] via SUBCUTANEOUS
  Administered 2016-09-26: 3 [IU] via SUBCUTANEOUS
  Administered 2016-09-26 (×4): 2 [IU] via SUBCUTANEOUS
  Administered 2016-09-27: 1 [IU] via SUBCUTANEOUS
  Administered 2016-09-27 (×2): 2 [IU] via SUBCUTANEOUS
  Administered 2016-09-27: 3 [IU] via SUBCUTANEOUS
  Administered 2016-09-27: 2 [IU] via SUBCUTANEOUS
  Administered 2016-09-28: 1 [IU] via SUBCUTANEOUS
  Administered 2016-09-28 (×3): 2 [IU] via SUBCUTANEOUS
  Administered 2016-09-28: 1 [IU] via SUBCUTANEOUS
  Administered 2016-09-29: 3 [IU] via SUBCUTANEOUS
  Administered 2016-09-29 (×2): 2 [IU] via SUBCUTANEOUS
  Administered 2016-09-29 (×2): 1 [IU] via SUBCUTANEOUS
  Administered 2016-09-29: 2 [IU] via SUBCUTANEOUS
  Administered 2016-09-29: 1 [IU] via SUBCUTANEOUS
  Administered 2016-09-30: 2 [IU] via SUBCUTANEOUS
  Administered 2016-09-30: 1 [IU] via SUBCUTANEOUS
  Administered 2016-09-30: 2 [IU] via SUBCUTANEOUS
  Filled 2016-09-19 (×66): qty 1

## 2016-09-19 MED ORDER — VANCOMYCIN HCL IN DEXTROSE 1-5 GM/200ML-% IV SOLN
1000.0000 mg | Freq: Once | INTRAVENOUS | Status: AC
  Administered 2016-09-19: 1000 mg via INTRAVENOUS
  Filled 2016-09-19: qty 200

## 2016-09-19 MED ORDER — VANCOMYCIN HCL IN DEXTROSE 750-5 MG/150ML-% IV SOLN
750.0000 mg | INTRAVENOUS | Status: DC
  Administered 2016-09-20 – 2016-09-24 (×5): 750 mg via INTRAVENOUS
  Filled 2016-09-19 (×5): qty 150

## 2016-09-19 MED ORDER — OSMOLITE 1.2 CAL PO LIQD
1000.0000 mL | ORAL | Status: DC
  Administered 2016-09-19: 1000 mL

## 2016-09-19 NOTE — Progress Notes (Signed)
Pharmacy Antibiotic Note  Misty Lucas is a 83 y.o. female admitted on 09/16/2016 with intra-abdominal infection s/t bowel perforation.  Pharmacy has been consulted for vanc/fluconazole dosing.  Plan: Will give patient vanc 1g IV x 1  Will f/u w/ vanc 750 mg IV q24h. Will draw vanc trough 9/11 @ 0700 prior to 3rd dose. Ke 0.0316 T1/2 21 ~ 24 hours Goal trough 15 - 20 mcg/mL  Fluconazole started empirically for bowel perforation. Since CrCl < 50 ml/min w/ load w/ fluconazole 400 mg IV x 1 and then start fluconazole 200 mg IV daily for empirical fungal coverage.  Height: 5\' 4"  (162.6 cm) Weight: 107 lb 14.4 oz (48.9 kg) IBW/kg (Calculated) : 54.7  Temp (24hrs), Avg:98 F (36.7 C), Min:97.9 F (36.6 C), Max:98 F (36.7 C)   Recent Labs Lab 09/16/16 0900 09/16/16 1023 09/17/16 0726 09/18/16 0346 09/19/16 0339  WBC 13.0*  --  18.9* 19.6* 20.8*  CREATININE 0.81  --  1.55* 1.09* 0.88  LATICACIDVEN  --  2.2*  --   --   --     Estimated Creatinine Clearance: 32.8 mL/min (by C-G formula based on SCr of 0.88 mg/dL).    Allergies  Allergen Reactions  . Carafate [Sucralfate] Other (See Comments)    Unknown, pt does not remember  . Lipitor [Atorvastatin] Other (See Comments)    Unknown, pt can't remember   . Mobic [Meloxicam] Other (See Comments)    Pt does not remember  . Penicillins Other (See Comments)    Pt does not remember  . Tramadol Other (See Comments)    Pt does not remember    Thank you for allowing pharmacy to be a part of this patient's care.  Tobie Lords, PharmD, BCPS Clinical Pharmacist 09/19/2016

## 2016-09-19 NOTE — Progress Notes (Signed)
MD notified of B.S of 188. Orders placed.

## 2016-09-19 NOTE — Progress Notes (Signed)
09/19/2016  Subjective: Patient is 3 Days Post-Op s/p exploratory laparotomy, distal gastrectomy, gastrojejunostomy, duodenal stump closure with omental patch, duodenal drainage tube placement, moss feeding tube placement.  Her right sided JP drain is now fully bilious in output, signifying that the duodenal closure and omental patch have failed.  Her duodenal drain output has decreased as well.  Left side JP drain remains serosanguinous.  Vital signs: Temp:  [97.9 F (36.6 C)-98 F (36.7 C)] 97.9 F (36.6 C) (09/08 2156) Pulse Rate:  [79-108] 79 (09/08 2156) Resp:  [18-19] 19 (09/08 2156) BP: (152-154)/(65-80) 152/80 (09/08 2156) SpO2:  [99 %] 99 % (09/08 2156) Weight:  [48.9 kg (107 lb 14.4 oz)] 48.9 kg (107 lb 14.4 oz) (09/08 2156)   Intake/Output: 09/08 0701 - 09/09 0700 In: 2420.4 [I.V.:2320.4; IV Piggyback:100] Out: 9242 [Urine:700; Drains:520] Last BM Date:  (unknown)  Physical Exam: Constitutional: No acute distress, comfortable in bed Abdomen:  Soft, nondistended, appropriately tender to palpation.  R JP drain with bilious fluid.  L JP drain with serosanguinous fluid.  L duodenal drain with small amount of bilious fluid.  Moss tube in place.    Labs:   Recent Labs  09/18/16 0346 09/19/16 0339  WBC 19.6* 20.8*  HGB 9.3* 9.7*  HCT 27.3* 28.7*  PLT 522* 492*    Recent Labs  09/18/16 0346 09/19/16 0339  NA 138 140  K 3.9 3.5  CL 109 110  CO2 23 24  GLUCOSE 101* 86  BUN 29* 22*  CREATININE 1.09* 0.88  CALCIUM 7.6* 7.8*   No results for input(s): LABPROT, INR in the last 72 hours.  Imaging: No results found.  Assessment/Plan: 83 yo female s/p exploratory laparotomy, distal gastrectomy, gastrojejunostomy, duodenal stump closure with omental patch, duodenal drainage tube placement, moss feeding tube placement.    --Continue R JP drain in place.  This is helping create a duodeno-cutaneous fistula to help with bilious drainage.   --WBC slowly increasing.  Will  broaden abx coverage today and start IV vancomycin and fluconazole.  Continue IV Zosyn.  ID consult in place, but there is no ID coverage over weekend. --pending palliative care consult --will start nutrition via jejunal component of moss tube.  Trickle feeds today with H2O flushes --OOB, ambulate with assistance --discussed with family last night.  Did offer arrange transfer to [facility] given the duodenal closure failing, in case anything more could be done, but agree with family that given the patient's illness and age, doing more would possibly be more detrimental to her.  At this point, she is DNR and would appreciate palliative care consult when they're able to see her.  The family understands that though I cannot confirm this is a perforation due to cancer, that most likely it is given how it looked and felt in the OR. Prognosis is poor.      Melvyn Neth, [place]

## 2016-09-19 NOTE — Progress Notes (Addendum)
Nutrition Follow-up  DOCUMENTATION CODES:   Severe malnutrition in context of chronic illness, Underweight  INTERVENTION:  Recommend initiating Osmolite 1.2 at trickle rate of 15 ml/hr in jejunal portion of tube.  If patient is tolerating feeds, can begin advancement per surgeon. Recommend advancing by 20 ml/hr Q12hrs to goal rate of Osmolite 1.2 at 55 ml/hr. Goal regimen provides 1584 kcal, 73 grams of protein, 1082 ml H2O daily.  Monitor magnesium, potassium, and phosphorus daily for at least 3 days, MD to replete as needed, as pt is at risk for refeeding syndrome given severe chronic malnutrition.  Recommend free water flush of 30 ml Q6hrs. Will provide total of 1202 ml H2O when patient is at goal TF rate.  NUTRITION DIAGNOSIS:   Malnutrition (Severe) related to chronic illness (decreased appetite with dementia and advanced age, chronic constipation, now duodenal perforation possibly from malignancy) as evidenced by severe depletion of body fat, severe depletion of muscle mass.  Ongoing - addressing with initiation of trickle tube feeds today.  GOAL:   Patient will meet greater than or equal to 90% of their needs  Not met.  MONITOR:   Labs, Weight trends, Skin, I & O's, TF tolerance  REASON FOR ASSESSMENT:   Ventilator    ASSESSMENT:   83 year old female with PMHx of dementia, gastritis, HLD, lumbar stenosis, hx MI 2008, arthritis, chronic constipation, surgical hx of appendectomy and abdominal hysterectomy who presented with abdominal pain admitted with sepsis and found to have pneumoperitoneum with duodenal perforation. Patient s/p exploratory laparotomy, distal gastrectomy, duodenal stump closure with omental patch, retrocolic gastrojejunostomy, Moss GJ tube placement into efferent gastrojejunostomy limb, retrograde duodenal drainage tube placement on 9/6.  -Pending surgical pathology. -Per LCSW note patient will not be able to return to [facility] with her feeding tube  and will need higher level of care. -Patient transferred out of ICU and is now on [place] unit.  Patient resting at time of assessment. She opened her eyes, but was not as alert as during initial assessment on 9/7.  Access: Moss GJ tube placed during surgery on 9/6  Medications reviewed and include: pantoprazole, D5-1/2NS with KCl 20 mEq/L at 100 ml/hr (120 grams dextrose, 408 kcal daily), Diflucan, Zosyn, vancomycin, Zofran PRN.  Labs reviewed: BUN 22 (trending down), Creatinine WNL at 0.88 (trending down). Potassium, Phosphorus, and Magnesium all WNL.  I/O 0700 9/8 to 0700 9/9: 700 ml UOP (0.6 ml/kg/hr), 520 ml output from drains  Weight trend: 48.9 kg on 9/8 (+4.2 kg from admission)  Discussed with RN.   Discussed nutrition plan of care with Dr. Hampton Abbot. Plan is to begin tube feeds at trickle rate today in jejunal portion of tube.  Diet Order:  Diet NPO time specified  Skin:  Wound (see comment) (closed incisions to abdomen and left hip)  Last BM:  Unknown  Height:   Ht Readings from Last 1 Encounters:  09/16/16 5' 4"  (1.626 m)    Weight:   Wt Readings from Last 1 Encounters:  09/18/16 107 lb 14.4 oz (48.9 kg)    Ideal Body Weight:  54.5 kg  BMI:  Body mass index is 18.52 kg/m.  Estimated Nutritional Needs:   Kcal:  1385-1615 (30-35 kcal/kg)  Protein:  70-83 grams (1.5-1.8 grams/kg)  Fluid:  1.1-1.4 L/day (25-30 ml/kg)  EDUCATION NEEDS:   No education needs identified at this time  Willey Blade, MS, RD, LDN Pager: [phone] After Hours Pager: [phone]

## 2016-09-19 NOTE — Progress Notes (Signed)
Foley removed at 11am per order.

## 2016-09-19 NOTE — Plan of Care (Signed)
Problem: Nutrition: Goal: Adequate nutrition will be maintained Outcome: Progressing Patient tube feeding initiated.

## 2016-09-19 NOTE — Progress Notes (Signed)
[place] at [place] NAME: Misty Lucas    MR#:  [id number]  DATE OF BIRTH:  1924/05/28    CHIEF COMPLAINT:   Chief Complaint  Patient presents with  . Abdominal Pain  . Constipation   Patient is more alert today, answers few questions. No family members at bedside REVIEW OF SYSTEMS:   Review of Systems  Constitutional: Positive for malaise/fatigue. Negative for chills and fever.  Respiratory: Negative for cough and shortness of breath.   Cardiovascular: Negative for chest pain and palpitations.  Gastrointestinal: Negative for nausea and vomiting.  Genitourinary: Negative for dysuria and urgency.  Neurological: Negative for dizziness, tingling and headaches.   confuse DRUG ALLERGIES:   Allergies  Allergen Reactions  . Carafate [Sucralfate] Other (See Comments)    Unknown, pt does not remember  . Lipitor [Atorvastatin] Other (See Comments)    Unknown, pt can't remember   . Mobic [Meloxicam] Other (See Comments)    Pt does not remember  . Penicillins Other (See Comments)    Pt does not remember  . Tramadol Other (See Comments)    Pt does not remember    VITALS:  Blood pressure (!) 146/57, pulse 91, temperature 97.6 F (36.4 C), temperature source Oral, resp. rate 19, height 5\' 4"  (1.626 m), weight 48.9 kg (107 lb 14.4 oz), SpO2 98 %.  PHYSICAL EXAMINATION:  GENERAL:  83 y.o.-year-old patient lying in the bed with no acute distress. Very confused and mumbling. EYES: Pupils equal, round, reactive to light  . No scleral icterus.  HEENT: Head atraumatic, normocephalic. Oropharynx and nasopharynx clear.  NECK:  Supple, no jugular venous distention. No thyroid enlargement, no tenderness.  LUNGS: Normal breath sounds bilaterally, no wheezing, rales,rhonchi or crepitation. No use of accessory muscles of respiration.  CARDIOVASCULAR: S1, S2 normal. No murmurs, rubs, or gallops.  ABDOMEN;midline incision is clean under  Honeycomb dressing. Abdominal binder is present Right-sided JP drain has blood-tinged fluid. Left JP   drain  Is serosanguineous.duodenal  Drain is bilious.  EXTREMITIES: No pedal edema, cyanosis, or clubbing.  NEUROLOGIC: Cranial nerves II through XII are intact. Muscle strength 5/5 in all extremities. Sensation intact. Gait not checked.  PSYCHIATRIC: The patient is alert and oriented x 3.  SKIN: No obvious rash, lesion, or ulcer.    LABORATORY PANEL:   CBC  Recent Labs Lab 09/19/16 0339  WBC 20.8*  HGB 9.7*  HCT 28.7*  PLT 492*   ------------------------------------------------------------------------------------------------------------------  Chemistries   Recent Labs Lab 09/16/16 0900  09/19/16 0339  NA 134*  < > 140  K 4.4  < > 3.5  CL 93*  < > 110  CO2 29  < > 24  GLUCOSE 171*  < > 86  BUN 15  < > 22*  CREATININE 0.81  < > 0.88  CALCIUM 9.2  < > 7.8*  MG  --   < > 1.9  AST 29  --   --   ALT 21  --   --   ALKPHOS 83  --   --   BILITOT 0.6  --   --   < > = values in this interval not displayed. ------------------------------------------------------------------------------------------------------------------  Cardiac Enzymes  Recent Labs Lab 09/17/16 0726  TROPONINI 0.08*   ------------------------------------------------------------------------------------------------------------------  RADIOLOGY:  No results found.  EKG:   Orders placed or performed during the hospital encounter of 09/16/16  . EKG 12-Lead  . EKG 12-Lead    ASSESSMENT AND PLAN:  #  Acute abdominal pain  secondary to bowel perforation status post emergency surgery for duodenal perforation, patient had gastrectomy with gastrojejunostomy with the duodenaldrain. continue  IV hydration.Patient has acute delirium on top of her dementia.  She pulled out NG tube.; Patient is transferred to surgery service  Continue nothing by mouth. ,jejunal feeds Management for surgery  Folic catheter  discontinued  #2 sepsis secondary to #1. Continue Zosyn and monitor blood counts Surgery has broadened the antibiotic coverage today and started IV vancomycin and fluconazole ID consult pending   #3 elevated troponin secondary to #1.  #4 .Alzheimer's dementia.; Currently patient is altered Palliative care consulted  CODE STATUS isDNR  Poor prognosis  Patient is transferred to surgery service  All the records are reviewed and case discussed with Care Management/Social Workerr.  CODE STATUS: DNR TOTAL TIME TAKING CARE OF THIS PATIENT: 35 minutes.   POSSIBLE D/C IN 1-2DAYS, DEPENDING ON CLINICAL CONDITION.   [name] M.D on 09/19/2016 at 2:29 PM  Between 7am to 6pm - Pager - [phone]  After 6pm go to [email or web address] - password [email or web address] [facility]  Office  [phone]  CC: Primary care physician; Rusty Aus, MD   Note: This dictation was prepared with Dragon dictation along with smaller phrase technology. Any transcriptional errors that result from this process are unintentional.

## 2016-09-20 DIAGNOSIS — R778 Other specified abnormalities of plasma proteins: Secondary | ICD-10-CM

## 2016-09-20 DIAGNOSIS — R748 Abnormal levels of other serum enzymes: Secondary | ICD-10-CM

## 2016-09-20 DIAGNOSIS — R7989 Other specified abnormal findings of blood chemistry: Secondary | ICD-10-CM

## 2016-09-20 DIAGNOSIS — Z515 Encounter for palliative care: Secondary | ICD-10-CM

## 2016-09-20 DIAGNOSIS — Z7189 Other specified counseling: Secondary | ICD-10-CM

## 2016-09-20 LAB — BASIC METABOLIC PANEL
Anion gap: 5 (ref 5–15)
BUN: 18 mg/dL (ref 6–20)
CHLORIDE: 109 mmol/L (ref 101–111)
CO2: 24 mmol/L (ref 22–32)
Calcium: 7.6 mg/dL — ABNORMAL LOW (ref 8.9–10.3)
Creatinine, Ser: 0.79 mg/dL (ref 0.44–1.00)
GFR calc Af Amer: >60 mL/min (ref >60)
GFR calc non Af Amer: >60 mL/min (ref >60)
GLUCOSE: 190 mg/dL — AB (ref 65–99)
Potassium: 3.7 mmol/L (ref 3.5–5.1)
SODIUM: 138 mmol/L (ref 135–145)

## 2016-09-20 LAB — CBC WITH DIFFERENTIAL/PLATELET
Basophils Absolute: 0 10*3/uL (ref 0–0.1)
Basophils Relative: 0 %
Eosinophils Absolute: 0 10*3/uL (ref 0–0.7)
Eosinophils Relative: 0 %
HCT: 27.1 % — ABNORMAL LOW (ref 35.0–47.0)
HEMOGLOBIN: 9.1 g/dL — AB (ref 12.0–16.0)
LYMPHS ABS: 0.6 10*3/uL — AB (ref 1.0–3.6)
LYMPHS PCT: 4 %
MCH: 29.7 pg (ref 26.0–34.0)
MCHC: 33.8 g/dL (ref 32.0–36.0)
MCV: 87.9 fL (ref 80.0–100.0)
Monocytes Absolute: 1.3 10*3/uL — ABNORMAL HIGH (ref 0.2–0.9)
Monocytes Relative: 8 %
NEUTROS PCT: 88 %
Neutro Abs: 14.9 10*3/uL — ABNORMAL HIGH (ref 1.4–6.5)
Platelets: 426 10*3/uL (ref 150–440)
RBC: 3.08 MIL/uL — AB (ref 3.80–5.20)
RDW: 14.3 % (ref 11.5–14.5)
WBC: 16.8 10*3/uL — AB (ref 3.6–11.0)

## 2016-09-20 LAB — GLUCOSE, CAPILLARY
GLUCOSE-CAPILLARY: 188 mg/dL — AB (ref 65–99)
GLUCOSE-CAPILLARY: 150 mg/dL — AB (ref 65–99)
GLUCOSE-CAPILLARY: 184 mg/dL — AB (ref 65–99)
Glucose-Capillary: 179 mg/dL — ABNORMAL HIGH (ref 65–99)
Glucose-Capillary: 195 mg/dL — ABNORMAL HIGH (ref 65–99)

## 2016-09-20 LAB — MAGNESIUM: MAGNESIUM: 1.9 mg/dL (ref 1.7–2.4)

## 2016-09-20 LAB — PHOSPHORUS: Phosphorus: 1.3 mg/dL — ABNORMAL LOW (ref 2.5–4.6)

## 2016-09-20 MED ORDER — KETOROLAC TROMETHAMINE 15 MG/ML IJ SOLN
15.0000 mg | Freq: Four times a day (QID) | INTRAMUSCULAR | Status: DC | PRN
  Administered 2016-09-21: 15 mg via INTRAVENOUS
  Filled 2016-09-20 (×2): qty 1

## 2016-09-20 MED ORDER — POTASSIUM PHOSPHATES 15 MMOLE/5ML IV SOLN
15.0000 mmol | Freq: Once | INTRAVENOUS | Status: AC
  Administered 2016-09-20: 15 mmol via INTRAVENOUS
  Filled 2016-09-20: qty 5

## 2016-09-20 MED ORDER — OSMOLITE 1.2 CAL PO LIQD
1000.0000 mL | ORAL | Status: DC
  Administered 2016-09-20 – 2016-09-21 (×2): 1000 mL

## 2016-09-20 MED ORDER — ENOXAPARIN SODIUM 40 MG/0.4ML ~~LOC~~ SOLN
40.0000 mg | SUBCUTANEOUS | Status: DC
  Administered 2016-09-21 – 2016-09-28 (×8): 40 mg via SUBCUTANEOUS
  Filled 2016-09-20 (×9): qty 0.4

## 2016-09-20 NOTE — Progress Notes (Signed)
[place] at [place] NAME: Misty Lucas    MR#:  [id number]  DATE OF BIRTH:  May 09, 1924    CHIEF COMPLAINT:   Chief Complaint  Patient presents with  . Abdominal Pain  . Constipation   Patient is more alert today, reports abdominal pain. Not feeling good REVIEW OF SYSTEMS:   Review of Systems  Constitutional: Positive for malaise/fatigue. Negative for chills and fever.  Respiratory: Negative for cough and shortness of breath.   Cardiovascular: Negative for chest pain and palpitations.  Gastrointestinal: Negative for nausea and vomiting.  Genitourinary: Negative for dysuria and urgency.  Neurological: Negative for dizziness, tingling and headaches.   confuse DRUG ALLERGIES:   Allergies  Allergen Reactions  . Carafate [Sucralfate] Other (See Comments)    Unknown, pt does not remember  . Lipitor [Atorvastatin] Other (See Comments)    Unknown, pt can't remember   . Mobic [Meloxicam] Other (See Comments)    Pt does not remember  . Penicillins Other (See Comments)    Pt does not remember  . Tramadol Other (See Comments)    Pt does not remember    VITALS:  Blood pressure (!) 146/49, pulse 88, temperature 97.9 F (36.6 C), temperature source Oral, resp. rate 18, height 5\' 4"  (1.626 m), weight 52 kg (114 lb 11.2 oz), SpO2 99 %.  PHYSICAL EXAMINATION:  GENERAL:  83 y.o.-year-old patient lying in the bed with no acute distress. Very confused and mumbling. EYES: Pupils equal, round, reactive to light  . No scleral icterus.  HEENT: Head atraumatic, normocephalic. Oropharynx and nasopharynx clear.  NECK:  Supple, no jugular venous distention. No thyroid enlargement, no tenderness.  LUNGS: Normal breath sounds bilaterally, no wheezing, rales,rhonchi or crepitation. No use of accessory muscles of respiration.  CARDIOVASCULAR: S1, S2 normal. No murmurs, rubs, or gallops.  ABDOMEN;midline incision is clean under Honeycomb dressing.  Abdominal binder is present Right-sided JP drain has blood-tinged fluid. Left JP   drain  Is serosanguineous.duodenal  Drain is bilious.  EXTREMITIES: No pedal edema, cyanosis, or clubbing.  NEUROLOGIC: Cranial nerves II through XII are intact. Muscle strength 5/5 in all extremities. Sensation intact. Gait not checked.  PSYCHIATRIC: The patient is alert and oriented x 3.  SKIN: No obvious rash, lesion, or ulcer.    LABORATORY PANEL:   CBC  Recent Labs Lab 09/20/16 0423  WBC 16.8*  HGB 9.1*  HCT 27.1*  PLT 426   ------------------------------------------------------------------------------------------------------------------  Chemistries   Recent Labs Lab 09/16/16 0900  09/20/16 0423  NA 134*  < > 138  K 4.4  < > 3.7  CL 93*  < > 109  CO2 29  < > 24  GLUCOSE 171*  < > 190*  BUN 15  < > 18  CREATININE 0.81  < > 0.79  CALCIUM 9.2  < > 7.6*  MG  --   < > 1.9  AST 29  --   --   ALT 21  --   --   ALKPHOS 83  --   --   BILITOT 0.6  --   --   < > = values in this interval not displayed. ------------------------------------------------------------------------------------------------------------------  Cardiac Enzymes  Recent Labs Lab 09/17/16 0726  TROPONINI 0.08*   ------------------------------------------------------------------------------------------------------------------  RADIOLOGY:  No results found.  EKG:   Orders placed or performed during the hospital encounter of 09/16/16  . EKG 12-Lead  . EKG 12-Lead    ASSESSMENT AND PLAN:  #Acute  abdominal pain  secondary to bowel perforation status post emergency surgery for duodenal perforation, patient had gastrectomy with gastrojejunostomy with the duodenaldrain. continue  IV hydration.Patient has baseline  dementia.  She pulled out NG tube.; Continue nothing by mouth. ,jejunal feeds Management for surgery  Folic catheter discontinued Encourage ambulation and incentive spirometry use  and pathology  result is pending  #2 sepsis secondary to #1. Continue Zosyn and monitor blood counts Surgery has broadened the antibiotic coverage today and started IV vancomycin and fluconazole ID consult pending  #hypo-phosphetemia Replete, pharmacy consulted for electrolite management as needed basis  # elevated troponin secondary to #1.  # .Alzheimer's dementia.; Currently patient is altered Palliative care consulted  CODE STATUS isDNR  Poor prognosis  Patient is transferred to surgery service  All the records are reviewed and case discussed with Care Management/Social Workerr.  CODE STATUS: DNR TOTAL TIME TAKING CARE OF THIS PATIENT: 35 minutes.   POSSIBLE D/C IN 1-2DAYS, DEPENDING ON CLINICAL CONDITION.   [name] M.D on 09/20/2016 at 2:37 PM  Between 7am to 6pm - Pager - [phone]  After 6pm go to [email or web address] - password [email or web address] [place] [facility]  Office  [phone]  CC: Primary care physician; Rusty Aus, MD   Note: This dictation was prepared with Dragon dictation along with smaller phrase technology. Any transcriptional errors that result from this process are unintentional.

## 2016-09-20 NOTE — Progress Notes (Signed)
Palliative Medicine consult noted. Due to high referral volume, there may be a delay seeing this patient. Please call the Palliative Medicine Team office at [phone] if recommendations are needed in the interim.  Thank you for inviting [place] to see this patient.  Marjie Skiff Kaymarie Wynn, RN, BSN, [facility] 09/20/2016 9:51 AM Cell [phone] 8:00-4:00 Monday-Friday Office [phone]

## 2016-09-20 NOTE — Progress Notes (Signed)
4 Days Post-Op   Subjective:  Patient is without active complaints this morning. States she's feeling okay. No acute changes per nursing staff.  Vital signs in last 24 hours: Temp:  [97.6 F (36.4 C)-98.1 F (36.7 C)] 98.1 F (36.7 C) (09/10 0502) Pulse Rate:  [91-101] 98 (09/10 0502) Resp:  [18-20] 18 (09/10 0502) BP: (145-157)/(57-67) 157/67 (09/10 0502) SpO2:  [97 %-98 %] 98 % (09/10 0502) Weight:  [48.7 kg (107 lb 6.4 oz)-52 kg (114 lb 11.2 oz)] 52 kg (114 lb 11.2 oz) (09/10 0427) Last BM Date:  (pt unsure)  Intake/Output from previous day: 09/09 0701 - 09/10 0700 In: 1531.4 [I.V.:1226.4; NG/GT:55; IV Piggyback:250] Out: 1240 [Urine:500; Drains:740]  GI: Abdomen is soft, probably tender to palpation at the incision sites, nondistended. Midline staples in place without erythema or drainage. Right-sided JP drain is frankly bilious. Minimal output through gastrostomy site. Left-sided JP drain is serosanguineous.  Lab Results:  CBC  Recent Labs  09/19/16 0339 09/20/16 0423  WBC 20.8* 16.8*  HGB 9.7* 9.1*  HCT 28.7* 27.1*  PLT 492* 426   CMP     Component Value Date/Time   NA 138 09/20/2016 0423   K 3.7 09/20/2016 0423   CL 109 09/20/2016 0423   CO2 24 09/20/2016 0423   GLUCOSE 190 (H) 09/20/2016 0423   BUN 18 09/20/2016 0423   CREATININE 0.79 09/20/2016 0423   CALCIUM 7.6 (L) 09/20/2016 0423   PROT 8.2 (H) 09/16/2016 0900   ALBUMIN 3.2 (L) 09/16/2016 0900   AST 29 09/16/2016 0900   ALT 21 09/16/2016 0900   ALKPHOS 83 09/16/2016 0900   BILITOT 0.6 09/16/2016 0900   GFRNONAA >60 09/20/2016 0423   GFRAA >60 09/20/2016 0423   PT/INR No results for input(s): LABPROT, INR in the last 72 hours.  Studies/Results: No results found.  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 Appears to have had duodenal stump blowout that is draining per the JP. Continue abdominal drainage. White blood cell count is  improving today. Continue current antibiotic therapy. Awaiting palliative care consult. Okay to increase tube feeds as tolerated. Encourage ambulation and incentive spirometer usage. Appreciate internal medicine assistance with this patient. Awaiting final pathology.   Clayburn Pert, MD [facility]  Day [facility] [phone] Night [facility] [phone]  09/20/2016

## 2016-09-20 NOTE — Consult Note (Signed)
MEDICATION RELATED CONSULT NOTE - INITIAL   Pharmacy Consult for electrolytes Indication: low phosphorus (refeeding)  Allergies  Allergen Reactions  . Carafate [Sucralfate] Other (See Comments)    Unknown, pt does not remember  . Lipitor [Atorvastatin] Other (See Comments)    Unknown, pt can't remember   . Mobic [Meloxicam] Other (See Comments)    Pt does not remember  . Penicillins Other (See Comments)    Pt does not remember  . Tramadol Other (See Comments)    Pt does not remember    Patient Measurements: Height: 5\' 4"  (162.6 cm) Weight: 114 lb 11.2 oz (52 kg) IBW/kg (Calculated) : 54.7 Adjusted Body Weight:   Vital Signs: Temp: 97.9 F (36.6 C) (09/10 1226) Temp Source: Oral (09/10 1226) BP: 146/49 (09/10 1226) Pulse Rate: 88 (09/10 1226) Intake/Output from previous day: 09/09 0701 - 09/10 0700 In: 1531.4 [I.V.:1226.4; NG/GT:55; IV Piggyback:250] Out: 1240 [Urine:500; Drains:740] Intake/Output from this shift: Total I/O In: 2299.2 [I.V.:1721.2; NG/GT:378; IV Piggyback:200] Out: 820 [Urine:400; Drains:420]  Labs:  Recent Labs  09/18/16 0346 09/19/16 0339 09/20/16 0423  WBC 19.6* 20.8* 16.8*  HGB 9.3* 9.7* 9.1*  HCT 27.3* 28.7* 27.1*  PLT 522* 492* 426  CREATININE 1.09* 0.88 0.79  MG 1.9 1.9 1.9  PHOS  --  2.5 1.3*   Estimated Creatinine Clearance: 38.4 mL/min (by C-G formula based on SCr of 0.79 mg/dL).   Microbiology: Recent Results (from the past 720 hour(s))  Urine Culture     Status: Abnormal   Collection Time: 09/16/16 10:23 AM  Result Value Ref Range Status   Specimen Description URINE, RANDOM  Final   Special Requests NONE  Final   Culture >=100,000 COLONIES/mL ESCHERICHIA COLI (A)  Final   Report Status 09/18/2016 FINAL  Final   Organism ID, Bacteria ESCHERICHIA COLI (A)  Final      Susceptibility   Escherichia coli - MIC*    AMPICILLIN >=32 RESISTANT Resistant     CEFAZOLIN 16 SENSITIVE Sensitive     CEFTRIAXONE <=1 SENSITIVE  Sensitive     CIPROFLOXACIN <=0.25 SENSITIVE Sensitive     GENTAMICIN <=1 SENSITIVE Sensitive     IMIPENEM <=0.25 SENSITIVE Sensitive     NITROFURANTOIN <=16 SENSITIVE Sensitive     TRIMETH/SULFA <=20 SENSITIVE Sensitive     AMPICILLIN/SULBACTAM >=32 RESISTANT Resistant     PIP/TAZO <=4 SENSITIVE Sensitive     Extended ESBL NEGATIVE Sensitive     * >=100,000 COLONIES/mL ESCHERICHIA COLI  MRSA PCR Screening     Status: None   Collection Time: 09/16/16  7:56 PM  Result Value Ref Range Status   MRSA by PCR NEGATIVE NEGATIVE Final    Comment:        The GeneXpert MRSA Assay (FDA approved for NASAL specimens only), is one component of a comprehensive MRSA colonization surveillance program. It is not intended to diagnose MRSA infection nor to guide or monitor treatment for MRSA infections.     Medical History: Past Medical History:  Diagnosis Date  . Arthritis   . Blood clot embolism during pregnancy, antepartum 1957   left thigh  . Fibrocystic breast    left breast  . Gastritis   . History of arthroscopy of knee 1980's   right  . Hyperlipemia   . Lumbar stenosis    2 buldging disc  . Myocardial infarction [facility] 2008    Medications:  Scheduled:  . [START ON 09/21/2016] enoxaparin (LOVENOX) injection  40 mg Subcutaneous Q24H  . feeding supplement (  OSMOLITE 1.2 CAL)  1,000 mL Per Tube Q24H  . free water  30 mL Per Tube Q6H  . insulin aspart  0-9 Units Subcutaneous Q4H  . pantoprazole (PROTONIX) IV  40 mg Intravenous Q12H    Assessment: Patient is a 83 year old female with a bowl perforation s/p gastrectomy w/ GJ tube. Pt started on tube feeds. Pt has severe malnutrition and is at risk for refeeding syndrome. Pharmacy consulted to manage electrolytes.  This AM phos=1.3 K=.3.7 Mag =1.9  Goal of Therapy:  Normalization of electrolytes  Plan:  Kphos 33mmol IV once Recheck all electrolytes with AM labs  Misty Lucas, Pharm.D, BCPS Clinical  Pharmacist  09/20/2016,2:41 PM

## 2016-09-20 NOTE — Consult Note (Signed)
[facility] Infectious Disease     Reason for Consult:Leukocytosis, duodenal perforation    Referring Physician: Kathlen Mody Date of Admission:  09/16/2016   Active Problems:   Sepsis ([place])   Elevated troponin I level   Palliative care by specialist   Encounter for hospice care discussion   HPI: Misty Lucas is a 83 y.o. female admitted with abd pain and constipation and found to have pneumoperitoneum with possible perforated peptic ulcer. Underwent surgery 9/6 ex lap Has been on zosyn since admission and started fluconazole 9/9.  Started vanco 9/9.  No fevers but persistent wbc elevation.  Has dementia - family at bedside. Patient unable to provide much history but denies active pain.    Past Medical History:  Diagnosis Date  . Arthritis   . Blood clot embolism during pregnancy, antepartum 1957   left thigh  . Fibrocystic breast    left breast  . Gastritis   . History of arthroscopy of knee 1980's   right  . Hyperlipemia   . Lumbar stenosis    2 buldging disc  . Myocardial infarction [facility]) 2008   Past Surgical History:  Procedure Laterality Date  . ABDOMINAL HYSTERECTOMY    . APPENDECTOMY    . BREAST SURGERY    . CARPAL TUNNEL RELEASE Bilateral 2007  . GUM SURGERY  1980's  . HIP ARTHROPLASTY Left 08/14/2016   Procedure: ARTHROPLASTY BIPOLAR HIP (HEMIARTHROPLASTY);  Surgeon: Lovell Sheehan, MD;  Location: [facility] ORS;  Service: Orthopedics;  Laterality: Left;  . KNEE ARTHROSCOPY Bilateral 1980's  . LAPAROTOMY N/A 09/16/2016   Procedure: EXPLORATORY LAPAROTOMY, DISTAL GASTRECTOMY WITH OVERSEWING OF STOMACH, GASTROJEJUNOSTOMY, PLACEMENT OF DUODENAL DRAIN AND GJTUBE;  Surgeon: Olean Ree, MD;  Location: [facility] ORS;  Service: General;  Laterality: N/A;   Social History  Substance Use Topics  . Smoking status: Former Smoker    Types: Cigarettes    Quit date: 03/26/1967  . Smokeless tobacco: Never Used  . Alcohol use No   Family History  Problem Relation Age of Onset  .  Heart attack Mother   . Tuberculosis Father   . Dementia Father     Allergies:  Allergies  Allergen Reactions  . Carafate [Sucralfate] Other (See Comments)    Unknown, pt does not remember  . Lipitor [Atorvastatin] Other (See Comments)    Unknown, pt can't remember   . Mobic [Meloxicam] Other (See Comments)    Pt does not remember  . Penicillins Other (See Comments)    Pt does not remember  . Tramadol Other (See Comments)    Pt does not remember    Current antibiotics: Antibiotics Given (last 72 hours)    Date/Time Action Medication Dose Rate   09/17/16 2238 New Bag/Given   piperacillin-tazobactam (ZOSYN) IVPB 3.375 g 3.375 g 12.5 mL/hr   09/18/16 1017 New Bag/Given   piperacillin-tazobactam (ZOSYN) IVPB 3.375 g 3.375 g 12.5 mL/hr   09/18/16 1821 New Bag/Given   piperacillin-tazobactam (ZOSYN) IVPB 3.375 g 3.375 g 12.5 mL/hr   09/19/16 0132 New Bag/Given   piperacillin-tazobactam (ZOSYN) IVPB 3.375 g 3.375 g 12.5 mL/hr   09/19/16 1101 New Bag/Given   vancomycin (VANCOCIN) IVPB 1000 mg/200 mL premix 1,000 mg 200 mL/hr   09/19/16 1245 New Bag/Given  [Pt had other infusions running.]   piperacillin-tazobactam (ZOSYN) IVPB 3.375 g 3.375 g 12.5 mL/hr   09/19/16 1724 New Bag/Given   piperacillin-tazobactam (ZOSYN) IVPB 3.375 g 3.375 g 12.5 mL/hr   09/20/16 0223 New Bag/Given   piperacillin-tazobactam (  ZOSYN) IVPB 3.375 g 3.375 g 12.5 mL/hr   09/20/16 1139 New Bag/Given  [Iv team restarted IV]   piperacillin-tazobactam (ZOSYN) IVPB 3.375 g 3.375 g 12.5 mL/hr   09/20/16 1139 New Bag/Given  [IV team restarted IV]   vancomycin (VANCOCIN) IVPB 750 mg/150 ml premix 750 mg 150 mL/hr      MEDICATIONS: . [START ON 09/21/2016] enoxaparin (LOVENOX) injection  40 mg Subcutaneous Q24H  . feeding supplement (OSMOLITE 1.2 CAL)  1,000 mL Per Tube Q24H  . free water  30 mL Per Tube Q6H  . insulin aspart  0-9 Units Subcutaneous Q4H  . pantoprazole (PROTONIX) IV  40 mg Intravenous Q12H     Review of Systems - unable to obtain  OBJECTIVE: Temp:  [97.6 F (36.4 C)-98.1 F (36.7 C)] 97.9 F (36.6 C) (09/10 1226) Pulse Rate:  [88-101] 88 (09/10 1226) Resp:  [18-20] 18 (09/10 0502) BP: (145-157)/(49-67) 146/49 (09/10 1226) SpO2:  [97 %-99 %] 99 % (09/10 1226) Weight:  [48.7 kg (107 lb 6.4 oz)-52 kg (114 lb 11.2 oz)] 52 kg (114 lb 11.2 oz) (09/10 0427) Physical Exam  Constitutional:  Thin, frail, lying in bed, alert  HENT: [place]/AT, PERRLA, no scleral icterus Mouth/Throat: Oropharynx is clear and dry. No oropharyngeal exudate.  Cardiovascular: Normal rate, regular rhythm and normal heart sounds Pulmonary/Chest: Effort normal and breath sounds normal. No respiratory distress.  has no wheezes.  Neck = supple, no nuchal rigidity Abdominal: Soft. 2 drains in L abd with thin tan secretions and one on R with bilious drainage  Lymphadenopathy: no cervical adenopathy. No axillary adenopathy Neurological: alert but disoriented Skin: Skin is warm and dry. No rash noted. No erythema.  Psychiatric: a normal mood and affect.  behavior is normal.   LABS: Results for orders placed or performed during the hospital encounter of 09/16/16 (from the past 48 hour(s))  CBC with Differential/Platelet     Status: Abnormal   Collection Time: 09/19/16  3:39 AM  Result Value Ref Range   WBC 20.8 (H) 3.6 - 11.0 K/uL   RBC 3.25 (L) 3.80 - 5.20 MIL/uL   Hemoglobin 9.7 (L) 12.0 - 16.0 g/dL   HCT 28.7 (L) 35.0 - 47.0 %   MCV 88.2 80.0 - 100.0 fL   MCH 29.9 26.0 - 34.0 pg   MCHC 33.9 32.0 - 36.0 g/dL   RDW 14.3 11.5 - 14.5 %   Platelets 492 (H) 150 - 440 K/uL   Neutrophils Relative % 92 %   Neutro Abs 19.1 (H) 1.4 - 6.5 K/uL   Lymphocytes Relative 3 %   Lymphs Abs 0.6 (L) 1.0 - 3.6 K/uL   Monocytes Relative 5 %   Monocytes Absolute 1.0 (H) 0.2 - 0.9 K/uL   Eosinophils Relative 0 %   Eosinophils Absolute 0.0 0 - 0.7 K/uL   Basophils Relative 0 %   Basophils Absolute 0.0 0 - 0.1 K/uL  Basic  metabolic panel     Status: Abnormal   Collection Time: 09/19/16  3:39 AM  Result Value Ref Range   Sodium 140 135 - 145 mmol/L   Potassium 3.5 3.5 - 5.1 mmol/L   Chloride 110 101 - 111 mmol/L   CO2 24 22 - 32 mmol/L   Glucose, Bld 86 65 - 99 mg/dL   BUN 22 (H) 6 - 20 mg/dL   Creatinine, Ser 0.88 0.44 - 1.00 mg/dL   Calcium 7.8 (L) 8.9 - 10.3 mg/dL   GFR calc non Af Amer 56 (L) >  60 mL/min   GFR calc Af Amer >60 >60 mL/min    Comment: (NOTE) The eGFR has been calculated using the CKD EPI equation. This calculation has not been validated in all clinical situations. eGFR's persistently <60 mL/min signify possible Chronic Kidney Disease.    Anion gap 6 5 - 15  Magnesium     Status: None   Collection Time: 09/19/16  3:39 AM  Result Value Ref Range   Magnesium 1.9 1.7 - 2.4 mg/dL  Phosphorus     Status: None   Collection Time: 09/19/16  3:39 AM  Result Value Ref Range   Phosphorus 2.5 2.5 - 4.6 mg/dL  Glucose, capillary     Status: Abnormal   Collection Time: 09/19/16 11:39 AM  Result Value Ref Range   Glucose-Capillary 155 (H) 65 - 99 mg/dL  Glucose, capillary     Status: Abnormal   Collection Time: 09/19/16  4:35 PM  Result Value Ref Range   Glucose-Capillary 188 (H) 65 - 99 mg/dL  Glucose, capillary     Status: Abnormal   Collection Time: 09/19/16  7:51 PM  Result Value Ref Range   Glucose-Capillary 183 (H) 65 - 99 mg/dL  Glucose, capillary     Status: Abnormal   Collection Time: 09/19/16 11:30 PM  Result Value Ref Range   Glucose-Capillary 202 (H) 65 - 99 mg/dL  Glucose, capillary     Status: Abnormal   Collection Time: 09/20/16  3:47 AM  Result Value Ref Range   Glucose-Capillary 188 (H) 65 - 99 mg/dL  CBC with Differential/Platelet     Status: Abnormal   Collection Time: 09/20/16  4:23 AM  Result Value Ref Range   WBC 16.8 (H) 3.6 - 11.0 K/uL   RBC 3.08 (L) 3.80 - 5.20 MIL/uL   Hemoglobin 9.1 (L) 12.0 - 16.0 g/dL   HCT 27.1 (L) 35.0 - 47.0 %   MCV 87.9 80.0 -  100.0 fL   MCH 29.7 26.0 - 34.0 pg   MCHC 33.8 32.0 - 36.0 g/dL   RDW 14.3 11.5 - 14.5 %   Platelets 426 150 - 440 K/uL   Neutrophils Relative % 88 %   Neutro Abs 14.9 (H) 1.4 - 6.5 K/uL   Lymphocytes Relative 4 %   Lymphs Abs 0.6 (L) 1.0 - 3.6 K/uL   Monocytes Relative 8 %   Monocytes Absolute 1.3 (H) 0.2 - 0.9 K/uL   Eosinophils Relative 0 %   Eosinophils Absolute 0.0 0 - 0.7 K/uL   Basophils Relative 0 %   Basophils Absolute 0.0 0 - 0.1 K/uL  Basic metabolic panel     Status: Abnormal   Collection Time: 09/20/16  4:23 AM  Result Value Ref Range   Sodium 138 135 - 145 mmol/L   Potassium 3.7 3.5 - 5.1 mmol/L   Chloride 109 101 - 111 mmol/L   CO2 24 22 - 32 mmol/L   Glucose, Bld 190 (H) 65 - 99 mg/dL   BUN 18 6 - 20 mg/dL   Creatinine, Ser 0.79 0.44 - 1.00 mg/dL   Calcium 7.6 (L) 8.9 - 10.3 mg/dL   GFR calc non Af Amer >60 >60 mL/min   GFR calc Af Amer >60 >60 mL/min    Comment: (NOTE) The eGFR has been calculated using the CKD EPI equation. This calculation has not been validated in all clinical situations. eGFR's persistently <60 mL/min signify possible Chronic Kidney Disease.    Anion gap 5 5 - 15  Magnesium  Status: None   Collection Time: 09/20/16  4:23 AM  Result Value Ref Range   Magnesium 1.9 1.7 - 2.4 mg/dL  Phosphorus     Status: Abnormal   Collection Time: 09/20/16  4:23 AM  Result Value Ref Range   Phosphorus 1.3 (L) 2.5 - 4.6 mg/dL  Glucose, capillary     Status: Abnormal   Collection Time: 09/20/16  7:42 AM  Result Value Ref Range   Glucose-Capillary 179 (H) 65 - 99 mg/dL   Comment 1 Notify RN   Glucose, capillary     Status: Abnormal   Collection Time: 09/20/16 11:39 AM  Result Value Ref Range   Glucose-Capillary 150 (H) 65 - 99 mg/dL   Comment 1 Notify RN    No components found for: ESR, C REACTIVE PROTEIN MICRO: Recent Results (from the past 720 hour(s))  Urine Culture     Status: Abnormal   Collection Time: 09/16/16 10:23 AM  Result  Value Ref Range Status   Specimen Description URINE, RANDOM  Final   Special Requests NONE  Final   Culture >=100,000 COLONIES/mL ESCHERICHIA COLI (A)  Final   Report Status 09/18/2016 FINAL  Final   Organism ID, Bacteria ESCHERICHIA COLI (A)  Final      Susceptibility   Escherichia coli - MIC*    AMPICILLIN >=32 RESISTANT Resistant     CEFAZOLIN 16 SENSITIVE Sensitive     CEFTRIAXONE <=1 SENSITIVE Sensitive     CIPROFLOXACIN <=0.25 SENSITIVE Sensitive     GENTAMICIN <=1 SENSITIVE Sensitive     IMIPENEM <=0.25 SENSITIVE Sensitive     NITROFURANTOIN <=16 SENSITIVE Sensitive     TRIMETH/SULFA <=20 SENSITIVE Sensitive     AMPICILLIN/SULBACTAM >=32 RESISTANT Resistant     PIP/TAZO <=4 SENSITIVE Sensitive     Extended ESBL NEGATIVE Sensitive     * >=100,000 COLONIES/mL ESCHERICHIA COLI  MRSA PCR Screening     Status: None   Collection Time: 09/16/16  7:56 PM  Result Value Ref Range Status   MRSA by PCR NEGATIVE NEGATIVE Final    Comment:        The GeneXpert MRSA Assay (FDA approved for NASAL specimens only), is one component of a comprehensive MRSA colonization surveillance program. It is not intended to diagnose MRSA infection nor to guide or monitor treatment for MRSA infections.     IMAGING: Ct Angio Chest Pe W And/or Wo Contrast  Result Date: 08/31/2016 CLINICAL DATA:  Chest pain. EXAM: CT ANGIOGRAPHY CHEST WITH CONTRAST TECHNIQUE: Multidetector CT imaging of the chest was performed using the standard protocol during bolus administration of intravenous contrast. Multiplanar CT image reconstructions and MIPs were obtained to evaluate the vascular anatomy. CONTRAST:  75 mL of Isovue 370 intravenously. COMPARISON:  Radiographs of same day. FINDINGS: Cardiovascular: Satisfactory opacification of the pulmonary arteries to the segmental level. No evidence of pulmonary embolism. Normal heart size. No pericardial effusion. Atherosclerosis of thoracic aorta is noted. Mediastinum/Nodes:  2 cm left thyroid nodule is noted. No mediastinal adenopathy is noted. The esophagus is unremarkable. Lungs/Pleura: Lungs are clear. No pleural effusion or pneumothorax. Upper Abdomen: No acute abnormality. Musculoskeletal: No chest wall abnormality. No acute or significant osseous findings. Review of the MIP images confirms the above findings. IMPRESSION: No definite evidence of pulmonary embolus. 2 cm left thyroid nodule is noted. Thyroid ultrasound is recommended for further evaluation. Aortic Atherosclerosis (ICD10-I70.0). Electronically Signed   By: Marijo Conception, M.D.   On: 08/31/2016 22:29   Dg Chest Port 1  View  Result Date: 09/08/2016 CLINICAL DATA:  Chest pain x3 days and dyspnea EXAM: PORTABLE CHEST 1 VIEW COMPARISON:  08/13/2016 FINDINGS: Stable cardiomegaly with aortic atherosclerosis. Clear lungs. Scoliotic curvature of the lower thoracic spine convex to the left is stable appearance. IMPRESSION: 1. Cardiomegaly with aortic atherosclerosis. 2. No acute pulmonary disease. 3. Levoscoliosis of the lower thoracic spine, stable in appearance. Electronically Signed   By: Ashley Royalty M.D.   On: 09/08/2016 20:55   Dg Abd 2 Views  Result Date: 09/16/2016 CLINICAL DATA:  Diffuse abdominal pain starting this morning, constipation and abdominal pain for several months but severely worse today, history gastritis, MI, former smoker EXAM: ABDOMEN - 2 VIEW COMPARISON:  08/31/2016 FINDINGS: Lung bases clear. Increased stool throughout colon. Nonobstructive bowel gas pattern. No bowel dilatation or bowel wall thickening. No free intraperitoneal air. Osseous demineralization with biconvex thoracolumbar scoliosis and note of a LEFT hip prosthesis. Scattered costal cartilaginous calcifications without definite urinary tract calcifications. IMPRESSION: Increased stool throughout colon. Otherwise negative exam. Electronically Signed   By: Lavonia Dana M.D.   On: 09/16/2016 09:36   Dg Abdomen Acute W/chest  Result  Date: 08/31/2016 CLINICAL DATA:  Pain. EXAM: DG ABDOMEN ACUTE W/ 1V CHEST COMPARISON:  None. FINDINGS: There is no evidence of dilated bowel loops or free intraperitoneal air. No radiopaque calculi or other significant radiographic abnormality is seen. Heart size and mediastinal contours are within normal limits. Both lungs are clear. IMPRESSION: Negative abdominal radiographs.  No acute cardiopulmonary disease. Electronically Signed   By: Dorise Bullion III M.D   On: 08/31/2016 19:32   Ct Angio Abd/pel W/ And/or W/o  Result Date: 09/16/2016 CLINICAL DATA:  83 year old female with a history of ongoing abdominal pain EXAM: CTA ABDOMEN AND PELVIS WITH CONTRAST TECHNIQUE: Multidetector CT imaging of the abdomen and pelvis was performed using the standard protocol during bolus administration of intravenous contrast. Multiplanar reconstructed images and MIPs were obtained and reviewed to evaluate the vascular anatomy. CONTRAST:  80 cc Isovue 370 COMPARISON:  None. FINDINGS: VASCULAR Aorta: Mild tortuosity of the lower thoracic and abdominal aorta. No aneurysm. No dissection flap. Scattered calcifications of the abdominal aorta. No periaortic fluid. Celiac: Moderate atherosclerotic changes at the celiac artery origin which is patent. Branch vessels are patent. SMA: Moderate atherosclerotic changes at the origin of the superior mesenteric artery which remains patent. Renals: Single bilateral renal arteries with mild atherosclerotic changes at the origin of the left renal artery. IMA: Inferior mesenteric artery is patent. Right lower extremity: Tortuous right-sided iliac system, with a regular beading appearance of the proximal external iliac artery, unchanged from prior. No significant stenosis. No dissection. Proximal femoral vasculature patent. Hypogastric artery patent. Left lower extremity: Tortuous left-sided iliac system. Hypogastric artery patent. Proximal femoral vasculature patent. Veins: Unremarkable  appearance of the venous system. Review of the MIP images confirms the above findings. NON-VASCULAR Lower chest: Unremarkable. Hepatobiliary: Unremarkable appearance of liver parenchyma. Unremarkable gallbladder. Pancreas: Unremarkable pancreas Spleen: Unremarkable spleen Adrenals/Urinary Tract: Unremarkable adrenal glands Right: No hydronephrosis. Symmetric perfusion to the left. No nephrolithiasis. Unremarkable course of the right ureter. Left: No hydronephrosis. Symmetric perfusion to the right. No nephrolithiasis. Unremarkable course of the left ureter. Unremarkable appearance of the urinary bladder . Stomach/Bowel: Small hiatal hernia. Circumferential thickening of the very pyloric region, distal stomach, with focal gaseous outpouching on the anterior wall of the proximal duodenum compatible with perforation of ulcer. This is in direct communication to free air on the anterior surface of the liver.  Relatively decompressed small bowel. Moderate to large stool burden throughout the length of the colon. Fluid filled distal colon with no significant burden of formed stool in the rectum. No transition. Appendix not identified. No significant inflammatory changes adjacent to the cecum. Lymphatic: No lymphadenopathy. Mesenteric: Significant volume of free air within the abdomen. Small foci in the hepatic hilum and adjacent to the proximal duodenum, compatible with proximal small bowel perforation. Free air layers inferiorly along the anterior abdominal wall to the lower abdomen/ pelvis. Small amount of free fluid within the dependent abdomen. Reproductive: Hysterectomy Other: No abdominal hernia. Musculoskeletal: Avulsion fracture of the left anterior inferior iliac spine. Multilevel degenerative changes of the visualized thoracolumbar spine. S-shaped curvature, with right apex curvature of the lumbar spine centered at L3. Associated degenerative disc disease. Surgical changes of left hip arthroplasty IMPRESSION:  Acute peri-pyloric/proximal duodenum ulcer perforation with free peritoneal air. There is associated inflammatory changes of proximal duodenum and distal stomach. These preliminary results were called by telephone at the time of interpretation on 09/16/2016 at 12:43 pm to Dr. Lenise Arena , who verbally acknowledged these results. Atherosclerotic changes of the abdominal aorta and mesenteric vessels, without high-grade stenosis of celiac artery, superior mesenteric artery, or inferior mesenteric artery. Tortuous bilateral iliac system. Irregular beaded appearance of the proximal right external iliac artery, potentially representing fibromuscular dysplasia, although an unusual distribution. Avulsion fracture of the left anterior inferior iliac spine, present on the comparison CT. Left hip arthroplasty. Electronically Signed   By: Corrie Mckusick D.O.   On: 09/16/2016 12:54   Ct Angio Abd/pel W And/or Wo Contrast  Result Date: 09/08/2016 CLINICAL DATA:  Lower abdominal pain with constipation for 3 days EXAM: CTA ABDOMEN AND PELVIS wITHOUT AND WITH CONTRAST TECHNIQUE: Multidetector CT imaging of the abdomen and pelvis was performed using the standard protocol during bolus administration of intravenous contrast. Multiplanar reconstructed images and MIPs were obtained and reviewed to evaluate the vascular anatomy. CONTRAST:  100 mL Isovue 370 intravenous COMPARISON:  Radiograph 821 1,018 FINDINGS: VASCULAR Aorta: No aneurysmal dilatation. Atherosclerotic calcifications. No dissection is seen. Celiac: Calcification at the origin with mild narrowing. Distal vessel branch vessels are patent SMA: Small amount of calcification at the origin. No significant stenosis. No filling defects Renals: Single right and single left renal artery. Mild narrowing at the origin of left renal artery. Normal right renal artery. IMA: Patent without evidence of aneurysm, dissection, vasculitis or significant stenosis. Inflow:  Atherosclerotic calcifications. No hemodynamically significant stenosis or occlusive disease. Proximal Outflow: Bilateral common femoral and visualized portions of the superficial and profunda femoral arteries are patent without evidence of aneurysm, dissection, vasculitis or significant stenosis. Veins: No obvious venous abnormality within the limitations of this arterial phase study. Review of the MIP images confirms the above findings. NON-VASCULAR Lower chest: Calcified granuloma in the right lower lobe. No consolidation or effusion. Mitral calcification. Normal heart size. Hepatobiliary: No focal liver abnormality is seen. No gallstones, gallbladder wall thickening, or biliary dilatation. Pancreas: Unremarkable. No pancreatic ductal dilatation or surrounding inflammatory changes. Spleen: Normal in size without focal abnormality. Adrenals/Urinary Tract: Adrenal glands are unremarkable. Kidneys are normal, without renal calculi, focal lesion, or hydronephrosis. Bladder is unremarkable. Prominent extrarenal pelvis bilaterally. Stomach/Bowel: Questionable wall thickening of the distal stomach with mild mucosal enhancement. No dilated small bowel. Diffuse fluid-filled colon. Moderate stool in the left colon. Moderate feces retention in the rectum. No wall thickening or intramural air. Nonvisualized appendix Lymphatic: No significantly enlarged abdominal or pelvic lymph nodes Reproductive:  Status post hysterectomy. No adnexal masses. Other: Negative for free air or free fluid Musculoskeletal: Scoliosis and degenerative changes. No acute or suspicious bone lesion. Status post left hip replacement with artifact IMPRESSION: VASCULAR 1. Scattered atherosclerotic calcifications of the aorta and branch vessels but no evidence for hemodynamically significant stenosis or occlusive disease. NON-VASCULAR 1. Fluid-filled right and transverse colon with moderate stool in the left colon and moderate feces retention in the rectum.  Negative for bowel obstruction or colon wall thickening. Negative for intramural air, mesenteric venous gas, or free air 2. Suggested mild wall thickening of the distal stomach with prominent mucosal enhancement, query gastritis Electronically Signed   By: Donavan Foil M.D.   On: 09/08/2016 22:47    Assessment:   Misty Lucas is a 83 y.o. female with dementia admitted with abd pain and found to have perforation of duodenum and has had surgery with drains in place. She is afebrile and HD stable but wbc remains elevated. On zosyn with recent addition of vanco and fluconazole. WBC decreased 20-16 since addition of these abx. She is very frail and palliative care consult is pending.  Recommendations Continue current vanco, zosyn and fluconazole Can narrow abx based on hospital course Thank you very much for allowing me to participate in the care of this patient. Please call with questions.   Cheral Marker. Ola Spurr, MD

## 2016-09-20 NOTE — Progress Notes (Signed)
Per dietician note, advanced tube feeding from 89ml/hr to 58ml/hr.

## 2016-09-20 NOTE — Progress Notes (Signed)
lovenox dose increased to 40mg  per protocol due to crcl >46ml/min and female body weight >45kg  Vala Raffo D Smayan Hackbart, Pharm.D, BCPS Clinical Pharmacist

## 2016-09-20 NOTE — Progress Notes (Signed)
Consultation Note Date: 09/20/2016   Patient Name: Misty Lucas  DOB: Aug 16, 1924  MRN: [id number]  Age / Sex: 83 y.o., female  PCP: Rusty Aus, MD Referring Physician: Olean Ree, MD  Reason for Consultation: Establishing goals of care  HPI/Patient Profile: 83 y.o. female  with past medical history of MI, alzheimer's dementia, recent hip surgery,  who was admitted on 09/16/2016 with a bowel perforation, and severe sepsis.  She underwent extensive surgery.  Post op she has had a difficult time with delirium, pulling out the n/g and poor overall healing.  Clinical Assessment and Goals of Care:  I have reviewed medical records including EPIC notes, labs and imaging, received report from the bedside RN, assessed the patient and then spoke on the phone with dtr in law [name] to discuss diagnosis prognosis, [place], EOL wishes, disposition and options.  I introduced Palliative Medicine as specialized medical care for people living with serious illness. It focuses on providing relief from the symptoms and stress of a serious illness. The goal is to improve quality of life for both the patient and the family.  We discussed a brief life review of the patient. She is a strong woman who was the single mother of two sons.  She worked as a Ship broker.  She has had a difficult time recently having just had hip fracture and repair (discharge 8/6).  After discharge she complained multiple times about abdominal pain and she was able to eat very little.  She made multiple trips to be evaluated but to no avail.  Finally she came to the ER and was found to have a bowel perf.   Just before surgery she talked with [name].  The patient confided - "I'm tired and I'm ready to go".  [name] said after their talk the patient seemed much more at peace.  [name] and I discussed in detail the particulars of the surgery, feeding, current  problems, recommendation for hospice house, and time frames.  She committed to discuss this with the patient's sons.  [name] will arrange a time for me to speak with [name] and Jimmie on 9/11.  At this point she feels Richard and Horris Latino do not understand their mother's current status - and that they have unrealistic expectations.  Interestingly when I briefly spoke with the patient and mentioned I was going to call her sons she commented  "Don't give them any false hope".     Primary Decision Maker:  NEXT OF KIN sons    SUMMARY OF RECOMMENDATIONS    PMT will meet with son tomorrow 9/11 [place] for peace and comfort  Code Status/Advance Care Planning:  DNR   Symptom Management:   Patient currently comfortable.  Prognosis:  Less than 2 weeks if artificial feedings were discontinued.   Discharge Planning: To Be Determined      Primary Diagnoses: Present on Admission: . Sepsis ([place])   I have reviewed the medical record, interviewed the patient and family, and examined the patient. The following  aspects are pertinent.  Past Medical History:  Diagnosis Date  . Arthritis   . Blood clot embolism during pregnancy, antepartum 1957   left thigh  . Fibrocystic breast    left breast  . Gastritis   . History of arthroscopy of knee 1980's   right  . Hyperlipemia   . Lumbar stenosis    2 buldging disc  . Myocardial infarction [facility]) 2008   Social History   Social History  . Marital status: Widowed    Spouse name: N/A  . Number of children: N/A  . Years of education: N/A   Social History Main Topics  . Smoking status: Former Smoker    Types: Cigarettes    Quit date: 03/26/1967  . Smokeless tobacco: Never Used  . Alcohol use No  . Drug use: No  . Sexual activity: Not Asked   Other Topics Concern  . None   Social History Narrative   Lives at [facility]   Family History  Problem Relation Age of Onset  . Heart attack Mother   . Tuberculosis  Father   . Dementia Father    Scheduled Meds: . enoxaparin (LOVENOX) injection  30 mg Subcutaneous Q24H  . feeding supplement (OSMOLITE 1.2 CAL)  1,000 mL Per Tube Q24H  . free water  30 mL Per Tube Q6H  . insulin aspart  0-9 Units Subcutaneous Q4H  . pantoprazole (PROTONIX) IV  40 mg Intravenous Q12H   Continuous Infusions: . dextrose 5 % and 0.45 % NaCl with KCl 20 mEq/L 100 mL/hr at 09/20/16 0426  . fluconazole (DIFLUCAN) IV Stopped (09/20/16 0904)  . piperacillin-tazobactam (ZOSYN)  IV 3.375 g (09/20/16 0223)  . vancomycin     PRN Meds:.hydrALAZINE, ketorolac, menthol-cetylpyridinium, morphine injection, ondansetron **OR** ondansetron (ZOFRAN) IV Allergies  Allergen Reactions  . Carafate [Sucralfate] Other (See Comments)    Unknown, pt does not remember  . Lipitor [Atorvastatin] Other (See Comments)    Unknown, pt can't remember   . Mobic [Meloxicam] Other (See Comments)    Pt does not remember  . Penicillins Other (See Comments)    Pt does not remember  . Tramadol Other (See Comments)    Pt does not remember   Review of Systems no complaints of pain or SOB  Physical Exam  Pleasant thin, frail, elderly female, NAD.  On oxygen (this is new for her) CV rrr resp no distress Abdominal binder in place. G/j tube with feedings Orientated to person, but not time or place.  Vital Signs: BP (!) 157/67 (BP Location: Right Arm)   Pulse 98   Temp 98.1 F (36.7 C) (Oral)   Resp 18   Ht 5\' 4"  (1.626 m)   Wt 52 kg (114 lb 11.2 oz)   SpO2 98%   BMI 19.69 kg/m  Pain Assessment: No/denies pain POSS *See Group Information*: S-Acceptable,Sleep, easy to arouse Pain Score: Asleep   SpO2: SpO2: 98 % O2 Device:SpO2: 98 % O2 Flow Rate: .O2 Flow Rate (L/min): 1.5 L/min  IO: Intake/output summary:  Intake/Output Summary (Last 24 hours) at 09/20/16 1125 Last data filed at 09/20/16 1021  Gross per 24 hour  Intake             2597 ml  Output             1330 ml  Net              1267 ml    LBM: Last BM Date:  (pt unsure) Baseline Weight: Weight:  46.7 kg (103 lb) Most recent weight: Weight: 52 kg (114 lb 11.2 oz)     Palliative Assessment/Data:   Flowsheet Rows     Most Recent Value  Intake Tab  Referral Department  Hospitalist  Unit at Time of Referral  Cardiac/Telemetry Unit  Palliative Care Primary Diagnosis  Other (Comment)  Date Notified  09/19/16  Palliative Care Type  New Palliative care  Reason for referral  Clarify Goals of Care  Date of Admission  09/16/16  Date first seen by Palliative Care  09/20/16  # of days Palliative referral response time  1 Day(s)  # of days IP prior to Palliative referral  3  Clinical Assessment  Palliative Performance Scale Score  30%  Psychosocial & Spiritual Assessment  Palliative Care Outcomes  Patient/Family meeting held?  Yes  Who was at the meeting?  patient and dtr in law (on phone)  Palliative Care Outcomes  Clarified goals of care      Time In: 10:45 Time Out:  11:55 Time Total: 70 min. Greater than 50%  of this time was spent counseling and coordinating care related to the above assessment and plan.  Signed by: [name], PA-C Palliative Medicine Pager: [phone]  Please contact Palliative Medicine Team phone at [phone] for questions and concerns.  For individual provider: See Shea Evans

## 2016-09-21 LAB — BASIC METABOLIC PANEL
Anion gap: 6 (ref 5–15)
BUN: 13 mg/dL (ref 6–20)
CO2: 22 mmol/L (ref 22–32)
CREATININE: 0.58 mg/dL (ref 0.44–1.00)
Calcium: 7.2 mg/dL — ABNORMAL LOW (ref 8.9–10.3)
Chloride: 107 mmol/L (ref 101–111)
GFR calc Af Amer: >60 mL/min (ref >60)
Glucose, Bld: 199 mg/dL — ABNORMAL HIGH (ref 65–99)
POTASSIUM: 3.3 mmol/L — AB (ref 3.5–5.1)
SODIUM: 135 mmol/L (ref 135–145)

## 2016-09-21 LAB — SURGICAL PATHOLOGY

## 2016-09-21 LAB — GLUCOSE, CAPILLARY
GLUCOSE-CAPILLARY: 185 mg/dL — AB (ref 65–99)
GLUCOSE-CAPILLARY: 188 mg/dL — AB (ref 65–99)
GLUCOSE-CAPILLARY: 200 mg/dL — AB (ref 65–99)
Glucose-Capillary: 144 mg/dL — ABNORMAL HIGH (ref 65–99)
Glucose-Capillary: 207 mg/dL — ABNORMAL HIGH (ref 65–99)
Glucose-Capillary: 237 mg/dL — ABNORMAL HIGH (ref 65–99)

## 2016-09-21 LAB — CBC
HEMATOCRIT: 26.5 % — AB (ref 35.0–47.0)
Hemoglobin: 8.9 g/dL — ABNORMAL LOW (ref 12.0–16.0)
MCH: 29.4 pg (ref 26.0–34.0)
MCHC: 33.6 g/dL (ref 32.0–36.0)
MCV: 87.4 fL (ref 80.0–100.0)
PLATELETS: 401 10*3/uL (ref 150–440)
RBC: 3.03 MIL/uL — AB (ref 3.80–5.20)
RDW: 14.7 % — AB (ref 11.5–14.5)
WBC: 17.3 10*3/uL — AB (ref 3.6–11.0)

## 2016-09-21 LAB — MAGNESIUM: Magnesium: 1.8 mg/dL (ref 1.7–2.4)

## 2016-09-21 LAB — PHOSPHORUS
Phosphorus: 1.8 mg/dL — ABNORMAL LOW (ref 2.5–4.6)
Phosphorus: 2.7 mg/dL (ref 2.5–4.6)

## 2016-09-21 LAB — POTASSIUM: POTASSIUM: 3.6 mmol/L (ref 3.5–5.1)

## 2016-09-21 LAB — VANCOMYCIN, TROUGH: Vancomycin Tr: 10 ug/mL — ABNORMAL LOW (ref 15–20)

## 2016-09-21 MED ORDER — SODIUM PHOSPHATES 45 MMOLE/15ML IV SOLN
15.0000 mmol | Freq: Once | INTRAVENOUS | Status: AC
  Administered 2016-09-21: 15 mmol via INTRAVENOUS
  Filled 2016-09-21: qty 5

## 2016-09-21 MED ORDER — MORPHINE SULFATE (PF) 2 MG/ML IV SOLN
2.0000 mg | INTRAVENOUS | Status: DC | PRN
  Administered 2016-09-26: 2 mg via INTRAVENOUS
  Filled 2016-09-21: qty 1

## 2016-09-21 NOTE — Progress Notes (Signed)
Daily Progress Note   Patient Name: Misty Lucas       Date: 09/21/2016 DOB: March 09, 1924  Age: 83 y.o. MRN#: [id number] Attending Physician: Olean Ree, MD Primary Care Physician: Rusty Aus, MD Admit Date: 09/16/2016  Reason for Consultation/Follow-up: Establishing goals of care  Subjective: Patient states she is uncomfortable and asks me not to examine her.  She was otherwise very pleasant.  I spoke with her bedside RN for an update.  Patient is having large amounts of drainage from her rt sided abdominal drain.  A PT consult has been placed.  Patient is still NPO.  Not taking much in the way of ice chips.  I was fortunate to speak with the patient's dtr in law and grand dtr as well as Dr. Adonis Huguenin.  Family is accepting that the patient is unlikely to heal well and will likely not survive without artifical hydration and nutrition.  They will likely opt for hospice house once her pathology results return.  Assessment: 83 yo female with alzheimers dementia and history of MI now post op from complex abdominal surgery.  Does not appear to be healing well.  Not on a diet.  We will follow to see if she progresses.   Patient Profile/HPI:  83 y.o. female  with past medical history of MI, alzheimer's dementia, recent hip surgery,  who was admitted on 09/16/2016 with a bowel perforation, and severe sepsis.  She underwent extensive surgery.  Post op she has had a difficult time with delirium, pulling out the n/g and poor overall healing.  Length of Stay: 5  Current Medications: Scheduled Meds:  . enoxaparin (LOVENOX) injection  40 mg Subcutaneous Q24H  . feeding supplement (OSMOLITE 1.2 CAL)  1,000 mL Per Tube Q24H  . free water  30 mL Per Tube Q6H  . insulin aspart  0-9 Units Subcutaneous Q4H  .  pantoprazole (PROTONIX) IV  40 mg Intravenous Q12H    Continuous Infusions: . dextrose 5 % and 0.45 % NaCl with KCl 20 mEq/L 75 mL/hr at 09/21/16 0729  . fluconazole (DIFLUCAN) IV Stopped (09/21/16 0956)  . piperacillin-tazobactam (ZOSYN)  IV 3.375 g (09/21/16 1005)  . sodium phosphate  Dextrose 5% IVPB    . vancomycin Stopped (09/21/16 0957)    PRN Meds: hydrALAZINE, ketorolac, menthol-cetylpyridinium, morphine injection, ondansetron **OR**  ondansetron (ZOFRAN) IV  Physical Exam        Frail elderly female appears in mild distress.  Refuses physical exam.  Awake, Alert, with clear speech but mildly confused.  Vital Signs: BP (!) 162/65 (BP Location: Right Arm)   Pulse 93   Temp 98 F (36.7 C) (Oral)   Resp 20   Ht 5\' 4"  (1.626 m)   Wt 52.6 kg (116 lb)   SpO2 98%   BMI 19.91 kg/m  SpO2: SpO2: 98 % O2 Device: O2 Device: Nasal Cannula O2 Flow Rate: O2 Flow Rate (L/min): 1.5 L/min  Intake/output summary:  Intake/Output Summary (Last 24 hours) at 09/21/16 1043 Last data filed at 09/21/16 0845  Gross per 24 hour  Intake            956.2 ml  Output             1920 ml  Net           -963.8 ml   LBM: Last BM Date:  (pt unsure) Baseline Weight: Weight: 46.7 kg (103 lb) Most recent weight: Weight: 52.6 kg (116 lb)       Palliative Assessment/Data:    Flowsheet Rows     Most Recent Value  Intake Tab  Referral Department  Hospitalist  Unit at Time of Referral  Cardiac/Telemetry Unit  Palliative Care Primary Diagnosis  Other (Comment)  Date Notified  09/19/16  Palliative Care Type  New Palliative care  Reason for referral  Clarify Goals of Care  Date of Admission  09/16/16  Date first seen by Palliative Care  09/20/16  # of days Palliative referral response time  1 Day(s)  # of days IP prior to Palliative referral  3  Clinical Assessment  Palliative Performance Scale Score  30%  Psychosocial & Spiritual Assessment  Palliative Care Outcomes  Patient/Family meeting  held?  Yes  Who was at the meeting?  patient and dtr in law (on phone)  Palliative Care Outcomes  Clarified goals of care      Patient Active Problem List   Diagnosis Date Noted  . Elevated troponin I level   . Palliative care by specialist   . Encounter for hospice care discussion   . Sepsis ([place]) 09/16/2016  . Perforation bowel ([place])   . Closed left hip fracture ([place]) 08/13/2016    Palliative Care Plan    Recommendations/Plan:  PMT will follow with you to monitor progression (PT consult pending, will she be able to have a diet)  Will meet with family to discuss GOC:  Do they want to continue artificial IVF / feeding?  and likely transition to hospice house in the next couple of days if she is not healing.  Will increase frequency of PRN morphine availability.  Goals of Care and Additional Recommendations:  Limitations on Scope of Treatment: Full Scope Treatment  Code Status:  DNR  Prognosis:   < 2 weeks given inability to take nutrition   Discharge Planning:  To Be Determined  Likely Hospice House.   Care plan was discussed with bedside RN and dtr in law [name] as well as Dr Adonis Huguenin of surgery.  Thank you for allowing the Palliative Medicine Team to assist in the care of this patient.  Total time spent:  55  Min.     Greater than 50%  of this time was spent counseling and coordinating care related to the above assessment and plan.  [name], PA-C Palliative Medicine  Please contact  Palliative MedicineTeam phone at [phone] for questions and concerns between 7 am - 7 pm.   Please see AMION for individual provider pager numbers.

## 2016-09-21 NOTE — Plan of Care (Signed)
Problem: Education: Goal: Knowledge of [place] General Education information/materials will improve Outcome: Not Progressing Patient is confused and needs assistance.  Problem: Health Behavior/Discharge Planning: Goal: Ability to manage health-related needs will improve Outcome: Not Progressing Patient is confused and needs assistance.

## 2016-09-21 NOTE — Progress Notes (Signed)
5 Days Post-Op   Subjective:  83 year old female status post exploratory laparotomy for ruptured gastric ulcer. She is without complaints this morning. Pleasant and conversant. Denies any abdominal pain. Tolerating her tube feeds. Continues to have large amounts of drainage from her abdominal drains.  Vital signs in last 24 hours: Temp:  [97.9 F (36.6 C)-98.6 F (37 C)] 98 F (36.7 C) (09/11 0407) Pulse Rate:  [88-93] 93 (09/11 0407) Resp:  [18-20] 20 (09/11 0407) BP: (146-173)/(49-75) 162/65 (09/11 0407) SpO2:  [97 %-99 %] 98 % (09/11 0407) Weight:  [52.6 kg (116 lb)-52.7 kg (116 lb 1.6 oz)] 52.6 kg (116 lb) (09/11 0407) Last BM Date:  (pt unsure)  Intake/Output from previous day: 09/10 0701 - 09/11 0700 In: 2991.2 [I.V.:2031.2; NG/GT:560; IV Piggyback:400] Out: 2280 [Urine:950; Drains:1330]  GI: Abdomen soft, nontender, nondistended. Midline staples well approximated without erythema or drainage. Honeycomb dressing still in place. Right-sided JP drain with copious amounts of bilious output. Left-sided JP drain is serosanguineous. Gastric tube with minimal output, jejunal tube with minimal output.  Lab Results:  CBC  Recent Labs  09/20/16 0423 09/21/16 0736  WBC 16.8* 17.3*  HGB 9.1* 8.9*  HCT 27.1* 26.5*  PLT 426 401   CMP     Component Value Date/Time   NA 135 09/21/2016 0736   K 3.3 (L) 09/21/2016 0736   CL 107 09/21/2016 0736   CO2 22 09/21/2016 0736   GLUCOSE 199 (H) 09/21/2016 0736   BUN 13 09/21/2016 0736   CREATININE 0.58 09/21/2016 0736   CALCIUM 7.2 (L) 09/21/2016 0736   PROT 8.2 (H) 09/16/2016 0900   ALBUMIN 3.2 (L) 09/16/2016 0900   AST 29 09/16/2016 0900   ALT 21 09/16/2016 0900   ALKPHOS 83 09/16/2016 0900   BILITOT 0.6 09/16/2016 0900   GFRNONAA >60 09/21/2016 0736   GFRAA >60 09/21/2016 0736   PT/INR No results for input(s): LABPROT, INR in the last 72 hours.  Studies/Results: No results found.  Assessment/Plan: 83 year old female  status post distal gastrectomy for large perforated gastric ulcer, presumed cancer. Pathology still pending. Duodenal stump blowout, currently controlled by JP drain. Appreciate internal medicine and infectious disease assistance with her care. Encourage ambulation and incentive spirometer usage as possible. Appreciate palliative care consult as well. Prognosis remains poor   Clayburn Pert, MD [facility] General Surgeon [facility]  [place] [phone] Night [facility] [phone]  09/21/2016

## 2016-09-21 NOTE — Consult Note (Signed)
MEDICATION RELATED CONSULT NOTE - INITIAL   Pharmacy Consult for electrolytes Indication: low phosphorus (refeeding)  Allergies  Allergen Reactions  . Carafate [Sucralfate] Other (See Comments)    Unknown, pt does not remember  . Lipitor [Atorvastatin] Other (See Comments)    Unknown, pt can't remember   . Mobic [Meloxicam] Other (See Comments)    Pt does not remember  . Penicillins Other (See Comments)    Pt does not remember  . Tramadol Other (See Comments)    Pt does not remember    Patient Measurements: Height: 5\' 4"  (162.6 cm) Weight: 116 lb (52.6 kg) IBW/kg (Calculated) : 54.7 Adjusted Body Weight:   Vital Signs: Temp: 98.3 F (36.8 C) (09/11 1308) Temp Source: Oral (09/11 1308) BP: 139/61 (09/11 1308) Pulse Rate: 86 (09/11 1308) Intake/Output from previous day: 09/10 0701 - 09/11 0700 In: 2991.2 [I.V.:2031.2; NG/GT:560; IV Piggyback:400] Out: 2280 [Urine:950; Drains:1330] Intake/Output from this shift: No intake/output data recorded.  Labs:  Recent Labs  09/19/16 0339 09/20/16 0423 09/21/16 0519 09/21/16 0736 09/21/16 1836  WBC 20.8* 16.8*  --  17.3*  --   HGB 9.7* 9.1*  --  8.9*  --   HCT 28.7* 27.1*  --  26.5*  --   PLT 492* 426  --  401  --   CREATININE 0.88 0.79  --  0.58  --   MG 1.9 1.9 1.8  --   --   PHOS 2.5 1.3* 1.8*  --  2.7   Estimated Creatinine Clearance: 38.8 mL/min (by C-G formula based on SCr of 0.58 mg/dL).   Microbiology: Recent Results (from the past 720 hour(s))  Urine Culture     Status: Abnormal   Collection Time: 09/16/16 10:23 AM  Result Value Ref Range Status   Specimen Description URINE, RANDOM  Final   Special Requests NONE  Final   Culture >=100,000 COLONIES/mL ESCHERICHIA COLI (A)  Final   Report Status 09/18/2016 FINAL  Final   Organism ID, Bacteria ESCHERICHIA COLI (A)  Final      Susceptibility   Escherichia coli - MIC*    AMPICILLIN >=32 RESISTANT Resistant     CEFAZOLIN 16 SENSITIVE Sensitive      CEFTRIAXONE <=1 SENSITIVE Sensitive     CIPROFLOXACIN <=0.25 SENSITIVE Sensitive     GENTAMICIN <=1 SENSITIVE Sensitive     IMIPENEM <=0.25 SENSITIVE Sensitive     NITROFURANTOIN <=16 SENSITIVE Sensitive     TRIMETH/SULFA <=20 SENSITIVE Sensitive     AMPICILLIN/SULBACTAM >=32 RESISTANT Resistant     PIP/TAZO <=4 SENSITIVE Sensitive     Extended ESBL NEGATIVE Sensitive     * >=100,000 COLONIES/mL ESCHERICHIA COLI  MRSA PCR Screening     Status: None   Collection Time: 09/16/16  7:56 PM  Result Value Ref Range Status   MRSA by PCR NEGATIVE NEGATIVE Final    Comment:        The GeneXpert MRSA Assay (FDA approved for NASAL specimens only), is one component of a comprehensive MRSA colonization surveillance program. It is not intended to diagnose MRSA infection nor to guide or monitor treatment for MRSA infections.     Medical History: Past Medical History:  Diagnosis Date  . Arthritis   . Blood clot embolism during pregnancy, antepartum 1957   left thigh  . Fibrocystic breast    left breast  . Gastritis   . History of arthroscopy of knee 1980's   right  . Hyperlipemia   . Lumbar stenosis  2 buldging disc  . Myocardial infarction ([place]) 2008    Medications:  Scheduled:  . enoxaparin (LOVENOX) injection  40 mg Subcutaneous Q24H  . feeding supplement (OSMOLITE 1.2 CAL)  1,000 mL Per Tube Q24H  . free water  30 mL Per Tube Q6H  . insulin aspart  0-9 Units Subcutaneous Q4H  . pantoprazole (PROTONIX) IV  40 mg Intravenous Q12H    Assessment: Patient is a 83 year old female with a bowl perforation s/p gastrectomy w/ GJ tube. Pt started on tube feeds. Pt has severe malnutrition and is at risk for refeeding syndrome. Pharmacy consulted to manage electrolytes.  This AM phos=1.8 K=.3.6 Mag =1.8  Goal of Therapy:  Normalization of electrolytes  Plan: 0911 1836 Phos: 2.7. No additional replacement needed at this time. Will follow up with AM labs and replace as needed.    Pernell Dupre, PharmD, BCPS Clinical Pharmacist 09/21/2016 7:32 PM

## 2016-09-21 NOTE — Clinical Social Work Note (Signed)
CSW continuing to follow. Palliative Care is working with patient's family and family is considering hospice home as a potential disposition. Shela Leff MSW,LCSW [phone]

## 2016-09-21 NOTE — Progress Notes (Signed)
[facility] NAME: Misty Lucas    MR#:  [id number]  DATE OF BIRTH:  Jun 08, 1924    CHIEF COMPLAINT:   Chief Complaint  Patient presents with  . Abdominal Pain  . Constipation   Patient is Feeling fine ,no complaints REVIEW OF SYSTEMS:   Review of Systems  Constitutional: Positive for malaise/fatigue. Negative for chills and fever.  HENT: Negative for nosebleeds and tinnitus.   Respiratory: Negative for cough and shortness of breath.   Cardiovascular: Negative for chest pain and palpitations.  Gastrointestinal: Negative for nausea and vomiting.  Skin: Negative for itching and rash.  Neurological: Negative for dizziness, tingling and headaches.   confuse DRUG ALLERGIES:   Allergies  Allergen Reactions  . Carafate [Sucralfate] Other (See Comments)    Unknown, pt does not remember  . Lipitor [Atorvastatin] Other (See Comments)    Unknown, pt can't remember   . Mobic [Meloxicam] Other (See Comments)    Pt does not remember  . Penicillins Other (See Comments)    Pt does not remember  . Tramadol Other (See Comments)    Pt does not remember    VITALS:  Blood pressure (!) 162/65, pulse 93, temperature 98 F (36.7 C), temperature source Oral, resp. rate 20, height 5\' 4"  (1.626 m), weight 52.6 kg (116 lb), SpO2 98 %.  PHYSICAL EXAMINATION:  GENERAL:  83 y.o.-year-old patient lying in the bed with no acute distress. Very confused and mumbling. EYES: Pupils equal, round, reactive to light  . No scleral icterus.  HEENT: Head atraumatic, normocephalic. Oropharynx and nasopharynx clear.  NECK:  Supple, no jugular venous distention. No thyroid enlargement, no tenderness.  LUNGS: Normal breath sounds bilaterally, no wheezing, rales,rhonchi or crepitation. No use of accessory muscles of respiration.  CARDIOVASCULAR: S1, S2 normal. No murmurs, rubs, or gallops.  ABDOMEN;midline incision is clean under Honeycomb dressing.  Abdominal binder is present Right-sided JP drain has blood-tinged fluid. Left JP   drain  Is serosanguineous.duodenal  Drain is bilious.  EXTREMITIES: No pedal edema, cyanosis, or clubbing.  NEUROLOGIC: Cranial nerves II through XII are intact. Muscle strength 5/5 in all extremities. Sensation intact. Gait not checked.  PSYCHIATRIC: The patient is alert and oriented x 3.  SKIN: No obvious rash, lesion, or ulcer.    LABORATORY PANEL:   CBC  Recent Labs Lab 09/21/16 0736  WBC 17.3*  HGB 8.9*  HCT 26.5*  PLT 401   ------------------------------------------------------------------------------------------------------------------  Chemistries   Recent Labs Lab 09/16/16 0900  09/21/16 0519 09/21/16 0736  NA 134*  < >  --  135  K 4.4  < > 3.6 3.3*  CL 93*  < >  --  107  CO2 29  < >  --  22  GLUCOSE 171*  < >  --  199*  BUN 15  < >  --  13  CREATININE 0.81  < >  --  0.58  CALCIUM 9.2  < >  --  7.2*  MG  --   < > 1.8  --   AST 29  --   --   --   ALT 21  --   --   --   ALKPHOS 83  --   --   --   BILITOT 0.6  --   --   --   < > = values in this interval not displayed. ------------------------------------------------------------------------------------------------------------------  Cardiac Enzymes  Recent Labs Lab 09/17/16 0726  TROPONINI  0.08*   ------------------------------------------------------------------------------------------------------------------  RADIOLOGY:  No results found.  EKG:   Orders placed or performed during the hospital encounter of 09/16/16  . EKG 12-Lead  . EKG 12-Lead    ASSESSMENT AND PLAN:  #Acute abdominal pain  secondary to bowel perforation status post emergency surgery for duodenal perforation, patient had gastrectomy with gastrojejunostomy with the duodenaldrain. continue  IV hydration.Patient has baseline  dementia.  Continue jejunal feeds Management for surgery  Foley  catheter discontinued Encourage ambulation and  incentive spirometry use  and pathology result is pending  #2 sepsis secondary to #1. Continue Zosyn , vanc , fluconazole and monitor blood counts Appreciate ID recommendations  #hypo-phosphetemia/hypokalemia  Replete, pharmacy consulted for electrolite management as needed basis  # elevated troponin secondary to #1.  # .Alzheimer's dementia.; Currently patient is altered Palliative care consulted  CODE STATUS isDNR  Poor prognosis  Patient is transferred to surgery service  All the records are reviewed and case discussed with Care Management/Social Workerr.  CODE STATUS: DNR TOTAL TIME TAKING CARE OF THIS PATIENT: 35 minutes.   POSSIBLE D/C IN 1-2DAYS, DEPENDING ON CLINICAL CONDITION.   [name] M.D on 09/21/2016 at 9:33 AM  Between 7am to 6pm - Pager - [phone]  After 6pm go to [email or web address] - password [email or web address] [facility]  Office  [phone]  CC: Primary care physician; Rusty Aus, MD   Note: This dictation was prepared with Dragon dictation along with smaller phrase technology. Any transcriptional errors that result from this process are unintentional.

## 2016-09-21 NOTE — Consult Note (Signed)
Pharmacy Antibiotic Note  Misty Lucas is a 83 y.o. female admitted on 09/16/2016 with perforated bowel.  Pharmacy has been consulted for vancomycin, zosyn and fluconazole dosing.  Plan: Continue fluconazole 200mg  daily Zosyn 3.375g q 8 hr EI infusion Vancomycin 750mg  q 24hr Level was ordered this AM, resulted at 10. However pt has only received 2 does. This is not steady state. Continue current dose and check level at steady state  Height: 5\' 4"  (162.6 cm) Weight: 116 lb (52.6 kg) IBW/kg (Calculated) : 54.7  Temp (24hrs), Avg:98.2 F (36.8 C), Min:97.9 F (36.6 C), Max:98.6 F (37 C)   Recent Labs Lab 09/16/16 1023 09/17/16 0726 09/18/16 0346 09/19/16 0339 09/20/16 0423 09/21/16 0647 09/21/16 0736  WBC  --  18.9* 19.6* 20.8* 16.8*  --  17.3*  CREATININE  --  1.55* 1.09* 0.88 0.79  --  0.58  LATICACIDVEN 2.2*  --   --   --   --   --   --   VANCOTROUGH  --   --   --   --   --  10*  --     Estimated Creatinine Clearance: 38.8 mL/min (by C-G formula based on SCr of 0.58 mg/dL).    Allergies  Allergen Reactions  . Carafate [Sucralfate] Other (See Comments)    Unknown, pt does not remember  . Lipitor [Atorvastatin] Other (See Comments)    Unknown, pt can't remember   . Mobic [Meloxicam] Other (See Comments)    Pt does not remember  . Penicillins Other (See Comments)    Pt does not remember  . Tramadol Other (See Comments)    Pt does not remember    Antimicrobials this admission: Zosyn 9/6>> Vancomycin 9/9>>  Fluconazole 9/9>>  Dose adjustments this admission:   Microbiology results:  9/6 UCx: ecoli   9/6 MRSA PCR: neg  Thank you for allowing pharmacy to be a part of this patient's care.  Ramond Dial, Pharm.D, BCPS Clinical Pharmacist  09/21/2016 8:19 AM

## 2016-09-21 NOTE — Consult Note (Signed)
MEDICATION RELATED CONSULT NOTE - INITIAL   Pharmacy Consult for electrolytes Indication: low phosphorus (refeeding)  Allergies  Allergen Reactions  . Carafate [Sucralfate] Other (See Comments)    Unknown, pt does not remember  . Lipitor [Atorvastatin] Other (See Comments)    Unknown, pt can't remember   . Mobic [Meloxicam] Other (See Comments)    Pt does not remember  . Penicillins Other (See Comments)    Pt does not remember  . Tramadol Other (See Comments)    Pt does not remember    Patient Measurements: Height: 5\' 4"  (162.6 cm) Weight: 116 lb (52.6 kg) IBW/kg (Calculated) : 54.7 Adjusted Body Weight:   Vital Signs: Temp: 98 F (36.7 C) (09/11 0407) Temp Source: Oral (09/11 0407) BP: 162/65 (09/11 0407) Pulse Rate: 93 (09/11 0407) Intake/Output from previous day: 09/10 0701 - 09/11 0700 In: 2991.2 [I.V.:2031.2; NG/GT:560; IV Piggyback:400] Out: 2280 [Urine:950; Drains:1330] Intake/Output from this shift: No intake/output data recorded.  Labs:  Recent Labs  09/19/16 0339 09/20/16 0423 09/21/16 0519 09/21/16 0736  WBC 20.8* 16.8*  --  17.3*  HGB 9.7* 9.1*  --  8.9*  HCT 28.7* 27.1*  --  26.5*  PLT 492* 426  --  401  CREATININE 0.88 0.79  --  0.58  MG 1.9 1.9 1.8  --   PHOS 2.5 1.3* 1.8*  --    Estimated Creatinine Clearance: 38.8 mL/min (by C-G formula based on SCr of 0.58 mg/dL).   Microbiology: Recent Results (from the past 720 hour(s))  Urine Culture     Status: Abnormal   Collection Time: 09/16/16 10:23 AM  Result Value Ref Range Status   Specimen Description URINE, RANDOM  Final   Special Requests NONE  Final   Culture >=100,000 COLONIES/mL ESCHERICHIA COLI (A)  Final   Report Status 09/18/2016 FINAL  Final   Organism ID, Bacteria ESCHERICHIA COLI (A)  Final      Susceptibility   Escherichia coli - MIC*    AMPICILLIN >=32 RESISTANT Resistant     CEFAZOLIN 16 SENSITIVE Sensitive     CEFTRIAXONE <=1 SENSITIVE Sensitive     CIPROFLOXACIN  <=0.25 SENSITIVE Sensitive     GENTAMICIN <=1 SENSITIVE Sensitive     IMIPENEM <=0.25 SENSITIVE Sensitive     NITROFURANTOIN <=16 SENSITIVE Sensitive     TRIMETH/SULFA <=20 SENSITIVE Sensitive     AMPICILLIN/SULBACTAM >=32 RESISTANT Resistant     PIP/TAZO <=4 SENSITIVE Sensitive     Extended ESBL NEGATIVE Sensitive     * >=100,000 COLONIES/mL ESCHERICHIA COLI  MRSA PCR Screening     Status: None   Collection Time: 09/16/16  7:56 PM  Result Value Ref Range Status   MRSA by PCR NEGATIVE NEGATIVE Final    Comment:        The GeneXpert MRSA Assay (FDA approved for NASAL specimens only), is one component of a comprehensive MRSA colonization surveillance program. It is not intended to diagnose MRSA infection nor to guide or monitor treatment for MRSA infections.     Medical History: Past Medical History:  Diagnosis Date  . Arthritis   . Blood clot embolism during pregnancy, antepartum 1957   left thigh  . Fibrocystic breast    left breast  . Gastritis   . History of arthroscopy of knee 1980's   right  . Hyperlipemia   . Lumbar stenosis    2 buldging disc  . Myocardial infarction ([place]) 2008    Medications:  Scheduled:  . enoxaparin (LOVENOX) injection  40 mg Subcutaneous Q24H  . feeding supplement (OSMOLITE 1.2 CAL)  1,000 mL Per Tube Q24H  . free water  30 mL Per Tube Q6H  . insulin aspart  0-9 Units Subcutaneous Q4H  . pantoprazole (PROTONIX) IV  40 mg Intravenous Q12H    Assessment: Patient is a 83 year old female with a bowl perforation s/p gastrectomy w/ GJ tube. Pt started on tube feeds. Pt has severe malnutrition and is at risk for refeeding syndrome. Pharmacy consulted to manage electrolytes.  This AM phos=1.8 K=.3.6 Mag =1.8  Goal of Therapy:  Normalization of electrolytes  Plan: Will give another 9mmol of Kphos IV and check phos level @ 1800 Recheck all electrolytes with AM labs  Jamy Cleckler D Seairra Otani, Pharm.D, BCPS Clinical  Pharmacist  09/21/2016,8:36 AM

## 2016-09-22 LAB — C DIFFICILE QUICK SCREEN W PCR REFLEX
C DIFFICILE (CDIFF) TOXIN: NEGATIVE
C Diff antigen: POSITIVE — AB

## 2016-09-22 LAB — GLUCOSE, CAPILLARY
GLUCOSE-CAPILLARY: 211 mg/dL — AB (ref 65–99)
GLUCOSE-CAPILLARY: 211 mg/dL — AB (ref 65–99)
Glucose-Capillary: 141 mg/dL — ABNORMAL HIGH (ref 65–99)
Glucose-Capillary: 146 mg/dL — ABNORMAL HIGH (ref 65–99)
Glucose-Capillary: 163 mg/dL — ABNORMAL HIGH (ref 65–99)
Glucose-Capillary: 180 mg/dL — ABNORMAL HIGH (ref 65–99)
Glucose-Capillary: 195 mg/dL — ABNORMAL HIGH (ref 65–99)

## 2016-09-22 LAB — BASIC METABOLIC PANEL
Anion gap: 4 — ABNORMAL LOW (ref 5–15)
BUN: 10 mg/dL (ref 6–20)
CALCIUM: 7.2 mg/dL — AB (ref 8.9–10.3)
CO2: 22 mmol/L (ref 22–32)
Chloride: 108 mmol/L (ref 101–111)
Creatinine, Ser: 0.57 mg/dL (ref 0.44–1.00)
GFR calc Af Amer: >60 mL/min (ref >60)
GLUCOSE: 190 mg/dL — AB (ref 65–99)
POTASSIUM: 3.5 mmol/L (ref 3.5–5.1)
SODIUM: 134 mmol/L — AB (ref 135–145)

## 2016-09-22 LAB — PHOSPHORUS: PHOSPHORUS: 2.3 mg/dL — AB (ref 2.5–4.6)

## 2016-09-22 LAB — MAGNESIUM: MAGNESIUM: 1.7 mg/dL (ref 1.7–2.4)

## 2016-09-22 LAB — CLOSTRIDIUM DIFFICILE BY PCR: Toxigenic C. Difficile by PCR: POSITIVE — AB

## 2016-09-22 MED ORDER — OCTREOTIDE ACETATE 100 MCG/ML IJ SOLN
50.0000 ug | Freq: Three times a day (TID) | INTRAMUSCULAR | Status: DC
  Administered 2016-09-22 – 2016-09-30 (×25): 50 ug via SUBCUTANEOUS
  Filled 2016-09-22 (×28): qty 0.5
  Filled 2016-09-22: qty 1
  Filled 2016-09-22: qty 0.5

## 2016-09-22 MED ORDER — SODIUM PHOSPHATES 45 MMOLE/15ML IV SOLN
10.0000 mmol | Freq: Once | INTRAVENOUS | Status: AC
  Administered 2016-09-22: 10 mmol via INTRAVENOUS
  Filled 2016-09-22: qty 3.33

## 2016-09-22 MED ORDER — OSMOLITE 1.2 CAL PO LIQD
1000.0000 mL | ORAL | Status: DC
  Administered 2016-09-22 – 2016-09-29 (×9): 1000 mL

## 2016-09-22 NOTE — Progress Notes (Signed)
Nutrition Follow-up  DOCUMENTATION CODES:   Severe malnutrition in context of chronic illness, Underweight  INTERVENTION:   Continue Osmolite 1.2 at 55 ml/hr. Goal regimen provides 1584 kcal, 73 grams of protein, 1082 ml H2O daily.  Free water flush of 30 ml Q6hrs. Will provide total of 1202 ml H2O when patient is at goal TF rate.  NUTRITION DIAGNOSIS:   Malnutrition (Severe) related to chronic illness (decreased appetite with dementia and advanced age, chronic constipation, now duodenal perforation possibly from malignancy) as evidenced by severe depletion of body fat, severe depletion of muscle mass.  Ongoing - addressing with tube feeds  GOAL:   Patient will meet greater than or equal to 90% of their needs  -meeting with tube feeds  MONITOR:   Diet advancement, Labs, Weight trends, TF tolerance, I & O's  ASSESSMENT:   83 year old female with PMHx of dementia, gastritis, HLD, lumbar stenosis, hx MI 2008, arthritis, chronic constipation, surgical hx of appendectomy and abdominal hysterectomy who presented with abdominal pain admitted with sepsis and found to have pneumoperitoneum with duodenal perforation. Patient s/p exploratory laparotomy, distal gastrectomy, duodenal stump closure with omental patch, retrocolic gastrojejunostomy, Moss GJ tube placement into efferent gastrojejunostomy limb, retrograde duodenal drainage tube placement on 9/6.  Pt tolerating tube feeds well; now at goal. Biopsies negative. Pt initiated on Octreotide and clear liquid diet today. Pt will likely need to continue tube feeds to meet estimated needs and encourage healing. Pt will not likely be able to meet estimated needs on an oral diet. Per MD note, JP drain continues to have significant volume of bilious fluid output. Pt with 16lb weight gain since admit; weight has stabilized over the past 2 days. Pt continues to have refeeding; Phosphorus low today. Electrolytes are being managed by pharmacy. Pt to  possibly discharge with hospice pending improvement over the next couple of days.     Medications reviewed and include: lovenox, insulin, octreotide, protonix, diflucan, zosyn, vancomycin  Labs reviewed: Na 134(L), Ca 7.2(L), P 2.3(L), Mg 1.7 wnl Wbc- 17.3(H), Hgb 8.9(L), Hct 26.5(L) cbgs- 190, 199, 190 x 48hrs  Diet Order:  Diet clear liquid Room service appropriate? Yes; Fluid consistency: Thin  Skin:  Wound (see comment) (closed incisions to abdomen and left hip)  Last BM:  9/11  Height:   Ht Readings from Last 1 Encounters:  09/16/16 5\' 4"  (1.626 m)    Weight:   Wt Readings from Last 1 Encounters:  09/22/16 114 lb 11.2 oz (52 kg)    Ideal Body Weight:  54.5 kg  BMI:  Body mass index is 19.69 kg/m.  Estimated Nutritional Needs:   Kcal:  1385-1615 (30-35 kcal/kg)  Protein:  70-83 grams (1.5-1.8 grams/kg)  Fluid:  1.1-1.4 L/day (25-30 ml/kg)  EDUCATION NEEDS:   No education needs identified at this time  Koleen Distance MS, RD, [place] Pager #[phone] After Hours Pager: [phone]

## 2016-09-22 NOTE — Progress Notes (Signed)
[facility] INFECTIOUS DISEASE PROGRESS NOTE Date of Admission:  09/16/2016     ID: Misty Lucas is a 83 y.o. female with intrabdominal abscess Active Problems:   Sepsis ([place])   Elevated troponin I level   Palliative care by specialist   Encounter for hospice care discussion   Subjective: No fever, wbc down some.  Path neg for cancer  ROS  Eleven systems are reviewed and negative except per hpi  Medications:  Antibiotics Given (last 72 hours)    Date/Time Action Medication Dose Rate   09/19/16 1724 New Bag/Given   piperacillin-tazobactam (ZOSYN) IVPB 3.375 g 3.375 g 12.5 mL/hr   09/20/16 0223 New Bag/Given   piperacillin-tazobactam (ZOSYN) IVPB 3.375 g 3.375 g 12.5 mL/hr   09/20/16 1139 New Bag/Given  [Iv team restarted IV]   piperacillin-tazobactam (ZOSYN) IVPB 3.375 g 3.375 g 12.5 mL/hr   09/20/16 1139 New Bag/Given  [IV team restarted IV]   vancomycin (VANCOCIN) IVPB 750 mg/150 ml premix 750 mg 150 mL/hr   09/20/16 1847 New Bag/Given   piperacillin-tazobactam (ZOSYN) IVPB 3.375 g 3.375 g 12.5 mL/hr   09/21/16 0118 New Bag/Given   piperacillin-tazobactam (ZOSYN) IVPB 3.375 g 3.375 g 12.5 mL/hr   09/21/16 0857 New Bag/Given   vancomycin (VANCOCIN) IVPB 750 mg/150 ml premix 750 mg 150 mL/hr   09/21/16 1005 New Bag/Given   piperacillin-tazobactam (ZOSYN) IVPB 3.375 g 3.375 g 12.5 mL/hr   09/21/16 1753 New Bag/Given   piperacillin-tazobactam (ZOSYN) IVPB 3.375 g 3.375 g 12.5 mL/hr   09/22/16 0216 New Bag/Given   piperacillin-tazobactam (ZOSYN) IVPB 3.375 g 3.375 g 12.5 mL/hr   09/22/16 5638 New Bag/Given   vancomycin (VANCOCIN) IVPB 750 mg/150 ml premix 750 mg 150 mL/hr   09/22/16 1025 New Bag/Given   piperacillin-tazobactam (ZOSYN) IVPB 3.375 g 3.375 g 12.5 mL/hr     . enoxaparin (LOVENOX) injection  40 mg Subcutaneous Q24H  . free water  30 mL Per Tube Q6H  . insulin aspart  0-9 Units Subcutaneous Q4H  . octreotide  50 mcg Subcutaneous Q8H  . pantoprazole  (PROTONIX) IV  40 mg Intravenous Q12H    Objective: Vital signs in last 24 hours: Temp:  [97.6 F (36.4 C)-98.8 F (37.1 C)] 97.6 F (36.4 C) (09/12 1353) Pulse Rate:  [87-96] 96 (09/12 1353) Resp:  [16-17] 17 (09/12 0440) BP: (145-171)/(55-69) 171/55 (09/12 1353) SpO2:  [97 %-99 %] 99 % (09/12 1353) Weight:  [52 kg (114 lb 11.2 oz)] 52 kg (114 lb 11.2 oz) (09/12 0501) Constitutional:  Thin, frail, lying in bed, alert  HENT: [place]/AT, PERRLA, no scleral icterus Mouth/Throat: Oropharynx is clear and dry. No oropharyngeal exudate.  Cardiovascular: Normal rate, regular rhythm and normal heart sounds Pulmonary/Chest: Effort normal and breath sounds normal. No respiratory distress.  has no wheezes.  Neck = supple, no nuchal rigidity Abdominal: Soft. J tube and drain in L abd with thin dark secretions and one on R with bilious drainage  Lymphadenopathy: no cervical adenopathy. No axillary adenopathy Neurological: alert but disoriented Skin: Skin is warm and dry. No rash noted. No erythema.  Psychiatric: a normal mood and affect.  behavior is normal  Lab Results  Recent Labs  09/20/16 0423  09/21/16 0736 09/22/16 0501  WBC 16.8*  --  17.3*  --   HGB 9.1*  --  8.9*  --   HCT 27.1*  --  26.5*  --   NA 138  --  135 134*  K 3.7  < > 3.3*  3.5  CL 109  --  107 108  CO2 24  --  22 22  BUN 18  --  13 10  CREATININE 0.79  --  0.58 0.57  < > = values in this interval not displayed.  Microbiology: Results for orders placed or performed during the hospital encounter of 09/16/16  Urine Culture     Status: Abnormal   Collection Time: 09/16/16 10:23 AM  Result Value Ref Range Status   Specimen Description URINE, RANDOM  Final   Special Requests NONE  Final   Culture >=100,000 COLONIES/mL ESCHERICHIA COLI (A)  Final   Report Status 09/18/2016 FINAL  Final   Organism ID, Bacteria ESCHERICHIA COLI (A)  Final      Susceptibility   Escherichia coli - MIC*    AMPICILLIN >=32 RESISTANT  Resistant     CEFAZOLIN 16 SENSITIVE Sensitive     CEFTRIAXONE <=1 SENSITIVE Sensitive     CIPROFLOXACIN <=0.25 SENSITIVE Sensitive     GENTAMICIN <=1 SENSITIVE Sensitive     IMIPENEM <=0.25 SENSITIVE Sensitive     NITROFURANTOIN <=16 SENSITIVE Sensitive     TRIMETH/SULFA <=20 SENSITIVE Sensitive     AMPICILLIN/SULBACTAM >=32 RESISTANT Resistant     PIP/TAZO <=4 SENSITIVE Sensitive     Extended ESBL NEGATIVE Sensitive     * >=100,000 COLONIES/mL ESCHERICHIA COLI  MRSA PCR Screening     Status: None   Collection Time: 09/16/16  7:56 PM  Result Value Ref Range Status   MRSA by PCR NEGATIVE NEGATIVE Final    Comment:        The GeneXpert MRSA Assay (FDA approved for NASAL specimens only), is one component of a comprehensive MRSA colonization surveillance program. It is not intended to diagnose MRSA infection nor to guide or monitor treatment for MRSA infections.   C difficile quick scan w PCR reflex     Status: Abnormal   Collection Time: 09/22/16  5:58 PM  Result Value Ref Range Status   C Diff antigen POSITIVE (A) NEGATIVE Final   C Diff toxin NEGATIVE NEGATIVE Final   C Diff interpretation Results are indeterminate. See PCR results.  Final  Clostridium Difficile by PCR     Status: Abnormal   Collection Time: 09/22/16  5:58 PM  Result Value Ref Range Status   Toxigenic C Difficile by pcr POSITIVE (A) NEGATIVE Final    Comment: Positive for toxigenic C. difficile with little to no toxin production. Only treat if clinical presentation suggests symptomatic illness.    Studies/Results: No results found.  Assessment/Plan: Misty Lucas is a 83 y.o. female with dementia admitted with abd pain and found to have perforation of duodenum and has had surgery with drains in place. She is afebrile and HD stable but wbc remains elevated. On zosyn but wbc elevated and had addition of vanco and fluconazole. WBC decreased 20-16 since addition of these abx.  MRSA PCR neg She is very frail  and palliative care consult is following. Path neg for cancer.   Recommendations Continue current vanco, zosyn and fluconazole Can narrow abx based on hospital course.  If no worsening wound dc vanco tomorrow  Thank you very much for the consult. Will follow with you.  Leonel Ramsay   09/22/2016, 3:38 PM

## 2016-09-22 NOTE — Progress Notes (Signed)
New hospice home referral received from [facility]. When writer arrived to speak with patient's son [name], he was in discussion with surgeon Dr. Hampton Abbot.  At this time the plan for transfer to the hospice home is on hold. Writer did introduce herself to Mr. Hargreaves, emotional support provided. Please re consult if the patient's needs change. CSW [facility] and Palliative Medicine PA [name] made aware. Thank you. Flo Shanks RN, BSN, [facility], hospital Liaison [phone] c

## 2016-09-22 NOTE — Clinical Social Work Note (Signed)
CSW received consult from Palliative Care. Palliative Care note reviewed and patient's son had discussion with them this morning and has decided upon the hospice home. Patient's son arrived to hospital stating that [facility] had told him to come to the hospital and sign some paperwork. Patient's son was distressed and stated that he didn't understand why he was told to come to the hospital and sign paperwork now when there was no one waiting for him to do so. CSW contacted Santiago Glad with [facility] and she informed CSW that she would be up to see patient's son in about 15 minutes. In the meantime, nursing clarified with the surgeon that patient's pathology results came back negative for cancer and thus hospice home was cancelled. CSW has sent patient's referral out for [facility] at this time. Shela Leff MSW,LCSW [phone]

## 2016-09-22 NOTE — Consult Note (Signed)
MEDICATION RELATED CONSULT NOTE - INITIAL   Pharmacy Consult for electrolytes Indication: low phosphorus (refeeding)  Allergies  Allergen Reactions  . Carafate [Sucralfate] Other (See Comments)    Unknown, pt does not remember  . Lipitor [Atorvastatin] Other (See Comments)    Unknown, pt can't remember   . Mobic [Meloxicam] Other (See Comments)    Pt does not remember  . Penicillins Other (See Comments)    Pt does not remember  . Tramadol Other (See Comments)    Pt does not remember    Patient Measurements: Height: 5\' 4"  (162.6 cm) Weight: 114 lb 11.2 oz (52 kg) IBW/kg (Calculated) : 54.7 Adjusted Body Weight:   Vital Signs: Temp: 98.8 F (37.1 C) (09/12 0440) Temp Source: Oral (09/11 2038) BP: 145/69 (09/12 0440) Pulse Rate: 87 (09/12 0440) Intake/Output from previous day: 09/11 0701 - 09/12 0700 In: 5536.8 [I.V.:2633.8; ZO/XW:9604; IV Piggyback:750] Out: 5409 [Drains:1690] Intake/Output from this shift: No intake/output data recorded.  Labs:  Recent Labs  09/20/16 0423 09/21/16 0519 09/21/16 0736 09/21/16 1836 09/22/16 0501  WBC 16.8*  --  17.3*  --   --   HGB 9.1*  --  8.9*  --   --   HCT 27.1*  --  26.5*  --   --   PLT 426  --  401  --   --   CREATININE 0.79  --  0.58  --  0.57  MG 1.9 1.8  --   --  1.7  PHOS 1.3* 1.8*  --  2.7 2.3*   Estimated Creatinine Clearance: 38.4 mL/min (by C-G formula based on SCr of 0.57 mg/dL).   Microbiology: Recent Results (from the past 720 hour(s))  Urine Culture     Status: Abnormal   Collection Time: 09/16/16 10:23 AM  Result Value Ref Range Status   Specimen Description URINE, RANDOM  Final   Special Requests NONE  Final   Culture >=100,000 COLONIES/mL ESCHERICHIA COLI (A)  Final   Report Status 09/18/2016 FINAL  Final   Organism ID, Bacteria ESCHERICHIA COLI (A)  Final      Susceptibility   Escherichia coli - MIC*    AMPICILLIN >=32 RESISTANT Resistant     CEFAZOLIN 16 SENSITIVE Sensitive     CEFTRIAXONE  <=1 SENSITIVE Sensitive     CIPROFLOXACIN <=0.25 SENSITIVE Sensitive     GENTAMICIN <=1 SENSITIVE Sensitive     IMIPENEM <=0.25 SENSITIVE Sensitive     NITROFURANTOIN <=16 SENSITIVE Sensitive     TRIMETH/SULFA <=20 SENSITIVE Sensitive     AMPICILLIN/SULBACTAM >=32 RESISTANT Resistant     PIP/TAZO <=4 SENSITIVE Sensitive     Extended ESBL NEGATIVE Sensitive     * >=100,000 COLONIES/mL ESCHERICHIA COLI  MRSA PCR Screening     Status: None   Collection Time: 09/16/16  7:56 PM  Result Value Ref Range Status   MRSA by PCR NEGATIVE NEGATIVE Final    Comment:        The GeneXpert MRSA Assay (FDA approved for NASAL specimens only), is one component of a comprehensive MRSA colonization surveillance program. It is not intended to diagnose MRSA infection nor to guide or monitor treatment for MRSA infections.     Medical History: Past Medical History:  Diagnosis Date  . Arthritis   . Blood clot embolism during pregnancy, antepartum 1957   left thigh  . Fibrocystic breast    left breast  . Gastritis   . History of arthroscopy of knee 1980's   right  .  Hyperlipemia   . Lumbar stenosis    2 buldging disc  . Myocardial infarction ([place]) 2008    Medications:  Scheduled:  . enoxaparin (LOVENOX) injection  40 mg Subcutaneous Q24H  . free water  30 mL Per Tube Q6H  . insulin aspart  0-9 Units Subcutaneous Q4H  . pantoprazole (PROTONIX) IV  40 mg Intravenous Q12H    Assessment: Patient is a 83 year old female with a bowl perforation s/p gastrectomy w/ GJ tube. Pt started on tube feeds. Pt has severe malnutrition and is at risk for refeeding syndrome. Pharmacy consulted to manage electrolytes.  This AM phos=2.3 K=.3.5 Mag =1.7  Goal of Therapy:  Normalization of electrolytes  Plan: Phos slightly low again at 2.3. Will give 49mmol of Kphos and recheck all electrolytes in the AM  Ramond Dial, PharmD, BCPS Clinical Pharmacist 09/22/2016 7:21 AM

## 2016-09-22 NOTE — Progress Notes (Signed)
09/22/2016  Subjective: Patient is 6 Days Post-Op status post exploratory laparotomy with distal gastrectomy, gastrojejunostomy, duodenal stump closure with omental patch, duodenal drainage tube placement, Moss feeding tube placement. Her right-sided JP drain continues to have significant volume of bilious fluid output. She does report some soreness in her abdomen but no worsening pain. She has been tolerating tube feeds and has had bowel movements. Currently tube feeds are at goal of 55 mL per hour. Continues on her antibiotics.  Vital signs: Temp:  [97.6 F (36.4 C)-98.8 F (37.1 C)] 98.8 F (37.1 C) (09/12 0440) Pulse Rate:  [86-91] 87 (09/12 0440) Resp:  [16-17] 17 (09/12 0440) BP: (139-148)/(61-69) 145/69 (09/12 0440) SpO2:  [97 %-100 %] 97 % (09/12 0440) Weight:  [52 kg (114 lb 11.2 oz)] 52 kg (114 lb 11.2 oz) (09/12 0501)   Intake/Output: 09/11 0701 - 09/12 0700 In: 5536.8 [I.V.:2633.8; PF/XT:0240; IV Piggyback:750] Out: 9735 [Drains:1690] Last BM Date: 09/21/16  Physical Exam: Constitutional: No acute distress Abdomen:  Soft, nondistended, properly tender to palpation. Right-sided JP drain with bilious fluid. Left-sided JP drain with serous fluid. Duodenal tube with minimal bilious drainage. Moss feeding tube in place with no complications from tube feeds ongoing. Midline incision clean dry and intact with staples.  Labs:   Recent Labs  09/20/16 0423 09/21/16 0736  WBC 16.8* 17.3*  HGB 9.1* 8.9*  HCT 27.1* 26.5*  PLT 426 401    Recent Labs  09/21/16 0736 09/22/16 0501  NA 135 134*  K 3.3* 3.5  CL 107 108  CO2 22 22  GLUCOSE 199* 190*  BUN 13 10  CREATININE 0.58 0.57  CALCIUM 7.2* 7.2*   No results for input(s): LABPROT, INR in the last 72 hours.  Imaging: No results found.  Assessment/Plan: 83 year old female status post exploratory laparotomy with distal gastrectomy, gastrojejunostomy, duodenal stump closure with omental patch, duodenal drainage tube  placement, Moss feeding tube placement.  -Pathology results came back last night. Overall there is no evidence of malignancy on the specimen removed. It appears that this was a very severe ulceration that she has had. Given that she had been to the ED a few times with abdominal pain, she may have had chronic ulceration that finally perforated. This left the lot of fibrous tissue in scarring. Discussed with the saw on the currently pathology was negative for cancer and this does change some of the management options. -Discussed with the son that although currently her prognosis is not great, now the cancer is out of the picture, we may be able to do some things to help with her healing process. Currently she is tolerating full goal of tube feeds and will start the patient clear liquid diet as well. We'll also start octreotide today to see if we can decrease the amount of duodenal fluid drainage from the [place]. I discussed with the son that we can give her a couple more days to see how she responds to this and if there is no improvement that we can continue proceeding with her previous plan of hospice for home but otherwise we can see how she improves or not. The patient understands this plan and is very appreciative [place] care we have given. Palliative care continues to be involved and will discuss further with them to hold on hospice for now.   Melvyn Neth, [place]

## 2016-09-22 NOTE — Progress Notes (Signed)
No charge note  I received a phone call from [name] (Patient's son) this morning.  We discussed the pathology results.  Richard was grief stricken but requests hospice house for his mother.   CSW order placed for hospice house.  [name], PA-C Palliative Medicine Pager: [phone]

## 2016-09-22 NOTE — NC FL2 (Signed)
[place] LEVEL OF CARE SCREENING TOOL     IDENTIFICATION  Patient Name: Misty Lucas Birthdate: January 23, 1924 Sex: female Admission Date (Current Location): 09/16/2016  [facility] and [place] Number:  Engineering geologist and Address:  [facility], [address] [place], [place] [address]      Provider Number: [id number]  Attending Physician Name and Address:  Olean Ree, MD  Relative Name and Phone Number:       Current Level of Care: Hospital Recommended Level of Care: [place] Prior Approval Number:    Date Approved/Denied:   PASRR Number:    Discharge Plan: SNF    Current Diagnoses: Patient Active Problem List   Diagnosis Date Noted  . Elevated troponin I level   . Palliative care by specialist   . Encounter for hospice care discussion   . Sepsis ([place]) 09/16/2016  . Perforation bowel ([place])   . Closed left hip fracture ([place]) 08/13/2016    Orientation RESPIRATION BLADDER Height & Weight     Self  Normal, O2 (2 liters) Incontinent Weight: 114 lb 11.2 oz (52 kg) Height:  5\' 4"  (162.6 cm)  BEHAVIORAL SYMPTOMS/MOOD NEUROLOGICAL BOWEL NUTRITION STATUS   (none)  (none) Incontinent Feeding tube, Diet (currently npo)  AMBULATORY STATUS COMMUNICATION OF NEEDS Skin   Extensive Assist Verbally Surgical wounds                       Personal Care Assistance Level of Assistance  Bathing, Dressing Bathing Assistance: Maximum assistance   Dressing Assistance: Maximum assistance     Functional Limitations Info  Sight, Hearing Sight Info: Impaired Hearing Info: Impaired      SPECIAL CARE FACTORS FREQUENCY  PT (By licensed PT)                    Contractures Contractures Info: Not present    Additional Factors Info  Code Status Code Status Info: dnr             Current Medications (09/22/2016):  This is the current hospital active medication list Current Facility-Administered Medications   Medication Dose Route Frequency Provider Last Rate Last Dose  . enoxaparin (LOVENOX) injection 40 mg  40 mg Subcutaneous Q24H Marcelle Overlie D, RPH   40 mg at 09/22/16 0814  . feeding supplement (OSMOLITE 1.2 CAL) liquid 1,000 mL  1,000 mL Per Tube Continuous Piscoya, Jose, MD 55 mL/hr at 09/22/16 0536 1,000 mL at 09/22/16 0536  . fluconazole (DIFLUCAN) IVPB 200 mg  200 mg Intravenous Q24H Olean Ree, MD   Stopped at 09/22/16 0925  . free water 30 mL  30 mL Per Tube Q6H Piscoya, Jose, MD   30 mL at 09/22/16 0000  . hydrALAZINE (APRESOLINE) injection 10 mg  10 mg Intravenous Q6H PRN Mody, Sital, MD      . insulin aspart (novoLOG) injection 0-9 Units  0-9 Units Subcutaneous Q4H Olean Ree, MD   2 Units at 09/22/16 [phone]  . ketorolac (TORADOL) 15 MG/ML injection 15 mg  15 mg Intravenous Q6H PRN Clayburn Pert, MD   15 mg at 09/21/16 1058  . menthol-cetylpyridinium (CEPACOL) lozenge 3 mg  1 lozenge Oral PRN Piscoya, Jose, MD      . morphine 2 MG/ML injection 2 mg  2 mg Intravenous Q2H PRN Dellinger, Haynes Dage L, PA-C      . octreotide (SANDOSTATIN) injection 50 mcg  50 mcg Subcutaneous Q8H Piscoya, Jose, MD  50 mcg at 09/22/16 1026  . ondansetron (ZOFRAN) tablet 4 mg  4 mg Oral Q6H PRN Bettey Costa, MD       Or  . ondansetron (ZOFRAN) injection 4 mg  4 mg Intravenous Q6H PRN Bettey Costa, MD   4 mg at 09/19/16 0339  . pantoprazole (PROTONIX) injection 40 mg  40 mg Intravenous Q12H Bettey Costa, MD   40 mg at 09/22/16 1025  . piperacillin-tazobactam (ZOSYN) IVPB 3.375 g  3.375 g Intravenous Q8H Epifanio Lesches, MD 12.5 mL/hr at 09/22/16 1025 3.375 g at 09/22/16 1025  . sodium phosphate 10 mmol in dextrose 5 % 250 mL infusion  10 mmol Intravenous Once Ramond Dial, RPH 42 mL/hr at 09/22/16 0929 10 mmol at 09/22/16 0929  . vancomycin (VANCOCIN) IVPB 750 mg/150 ml premix  750 mg Intravenous Q24H Olean Ree, MD   Stopped at 09/22/16 1021     Discharge Medications: Please see  discharge summary for a list of discharge medications.  Relevant Imaging Results:  Relevant Lab Results:   Additional Information ss: [id number]  Shela Leff, LCSW

## 2016-09-22 NOTE — Care Management Important Message (Signed)
Important Message  Patient Details  Name: Misty Lucas MRN: [id number] Date of Birth: [birth year]   Medicare Important Message Given:  Yes    Beverly Sessions, RN 09/22/2016, 3:02 PM

## 2016-09-23 LAB — CBC WITH DIFFERENTIAL/PLATELET
BASOS ABS: 0.1 10*3/uL (ref 0–0.1)
Basophils Relative: 1 %
EOS PCT: 4 %
Eosinophils Absolute: 0.6 10*3/uL (ref 0–0.7)
HCT: 25.3 % — ABNORMAL LOW (ref 35.0–47.0)
Hemoglobin: 8.9 g/dL — ABNORMAL LOW (ref 12.0–16.0)
LYMPHS PCT: 5 %
Lymphs Abs: 0.7 10*3/uL — ABNORMAL LOW (ref 1.0–3.6)
MCH: 31.2 pg (ref 26.0–34.0)
MCHC: 35.2 g/dL (ref 32.0–36.0)
MCV: 88.4 fL (ref 80.0–100.0)
MONO ABS: 1.2 10*3/uL — AB (ref 0.2–0.9)
Monocytes Relative: 7 %
Neutro Abs: 14 10*3/uL — ABNORMAL HIGH (ref 1.4–6.5)
Neutrophils Relative %: 83 %
PLATELETS: 441 10*3/uL — AB (ref 150–440)
RBC: 2.86 MIL/uL — ABNORMAL LOW (ref 3.80–5.20)
RDW: 14.5 % (ref 11.5–14.5)
WBC: 16.6 10*3/uL — ABNORMAL HIGH (ref 3.6–11.0)

## 2016-09-23 LAB — BASIC METABOLIC PANEL
ANION GAP: 6 (ref 5–15)
BUN: 11 mg/dL (ref 6–20)
CALCIUM: 7 mg/dL — AB (ref 8.9–10.3)
CO2: 23 mmol/L (ref 22–32)
CREATININE: 0.6 mg/dL (ref 0.44–1.00)
Chloride: 107 mmol/L (ref 101–111)
GFR calc Af Amer: >60 mL/min (ref >60)
GFR calc non Af Amer: >60 mL/min (ref >60)
GLUCOSE: 179 mg/dL — AB (ref 65–99)
Potassium: 3.4 mmol/L — ABNORMAL LOW (ref 3.5–5.1)
Sodium: 136 mmol/L (ref 135–145)

## 2016-09-23 LAB — GLUCOSE, CAPILLARY
GLUCOSE-CAPILLARY: 159 mg/dL — AB (ref 65–99)
GLUCOSE-CAPILLARY: 203 mg/dL — AB (ref 65–99)
Glucose-Capillary: 222 mg/dL — ABNORMAL HIGH (ref 65–99)
Glucose-Capillary: 209 mg/dL — ABNORMAL HIGH (ref 65–99)
Glucose-Capillary: 166 mg/dL — ABNORMAL HIGH (ref 65–99)

## 2016-09-23 LAB — PROTIME-INR
INR: 1.24
Prothrombin Time: 15.5 seconds — ABNORMAL HIGH (ref 11.4–15.2)

## 2016-09-23 LAB — MAGNESIUM: Magnesium: 1.7 mg/dL (ref 1.7–2.4)

## 2016-09-23 LAB — PHOSPHORUS: Phosphorus: 2.6 mg/dL (ref 2.5–4.6)

## 2016-09-23 MED ORDER — VANCOMYCIN 50 MG/ML ORAL SOLUTION
125.0000 mg | Freq: Four times a day (QID) | ORAL | Status: DC
  Administered 2016-09-23 – 2016-09-30 (×29): 125 mg
  Filled 2016-09-23 (×32): qty 2.5

## 2016-09-23 MED ORDER — METRONIDAZOLE 500 MG PO TABS
500.0000 mg | ORAL_TABLET | Freq: Three times a day (TID) | ORAL | Status: DC
  Administered 2016-09-23 – 2016-09-27 (×13): 500 mg
  Filled 2016-09-23 (×15): qty 1

## 2016-09-23 MED ORDER — POTASSIUM CHLORIDE 10 MEQ/100ML IV SOLN
10.0000 meq | INTRAVENOUS | Status: AC
  Administered 2016-09-23 (×3): 10 meq via INTRAVENOUS
  Filled 2016-09-23 (×3): qty 100

## 2016-09-23 MED ORDER — METRONIDAZOLE 50 MG/ML ORAL SUSPENSION
500.0000 mg | Freq: Three times a day (TID) | ORAL | Status: DC
  Filled 2016-09-23: qty 10

## 2016-09-23 NOTE — Progress Notes (Addendum)
No charge note.  Went to patient's room.  She is peaceful and sound asleep so I did not disturb her.  PMT had multiple conversations with family [name] and Richard).  They are agreeable to hospice house services.  Surgery is making a few changes to determine if they can add quality time to the patient's life.    The family (particularly the sons) have been struggling greatly with the decision for Hospice House.  They have been on a traumatic roller coaster.  The son commented to me that "doctors should make these decisions, I should not have to make the decision to kill my mother".   I explained that his mother may be dying from having a perforated ulcer at age 83 and that it has nothing to do with any decisions he will make.  If Surgery's attempts to add quality time to Mrs. Callejo life (quality = time without feeding tubes or artificial hydration) are unsuccessful - it would benefit the family if Surgery would directly tell the family that it is time to transfer her to [facility] (rather than asking the question).  If the attempts are successful, I anticipate she will need SNF at the time of d/c.  PMT will check on the patient again on Monday.   If a transfer to [facility] is needed in the meantime please put in a social work order and in the comments write "hospice house".  [name], PA-C Palliative Medicine Pager: [phone]

## 2016-09-23 NOTE — Progress Notes (Signed)
[place] at [place] NAME: Misty Lucas    MR#:  [id number]  DATE OF BIRTH:  [birth year]    CHIEF COMPLAINT:   Chief Complaint  Patient presents with  . Abdominal Pain  . Constipation   Patient is Feeling fine ,no complaints, appears weak. Tube feeding is on, have discharge from the drainage tube.  REVIEW OF SYSTEMS:   Review of Systems  Constitutional: Positive for malaise/fatigue. Negative for chills and fever.  HENT: Negative for nosebleeds and tinnitus.   Respiratory: Negative for cough and shortness of breath.   Cardiovascular: Negative for chest pain and palpitations.  Gastrointestinal: Negative for nausea and vomiting.  Skin: Negative for itching and rash.  Neurological: Negative for dizziness, tingling and headaches.   confuse DRUG ALLERGIES:   Allergies  Allergen Reactions  . Carafate [Sucralfate] Other (See Comments)    Unknown, pt does not remember  . Lipitor [Atorvastatin] Other (See Comments)    Unknown, pt can't remember   . Mobic [Meloxicam] Other (See Comments)    Pt does not remember  . Penicillins Other (See Comments)    Pt does not remember  . Tramadol Other (See Comments)    Pt does not remember    VITALS:  Blood pressure (!) 152/58, pulse 88, temperature 98.6 F (37 C), temperature source Oral, resp. rate 20, height 5\' 4"  (1.626 m), weight 50 kg (110 lb 4.8 oz), SpO2 98 %.  PHYSICAL EXAMINATION:  GENERAL:  83 y.o.-year-old patient lying in the bed with no acute distress. Very confused and mumbling. EYES: Pupils equal, round, reactive to light  . No scleral icterus.  HEENT: Head atraumatic, normocephalic. Oropharynx and nasopharynx clear.  NECK:  Supple, no jugular venous distention. No thyroid enlargement, no tenderness.  LUNGS: Normal breath sounds bilaterally, no wheezing, rales,rhonchi or crepitation. No use of accessory muscles of respiration.  CARDIOVASCULAR: S1, S2 normal. No murmurs,  rubs, or gallops.  ABDOMEN;midline incision is clean under [place] dressing. Abdominal binder is present Right-sided JP drain has blood-tinged fluid. Left JP   drain  Is serosanguineous.duodenal  Drain is bilious. EXTREMITIES: No pedal edema, cyanosis, or clubbing.  NEUROLOGIC: Cranial nerves II through XII are intact. Muscle strength 3-4/5 in all extremities. Sensation intact. Gait not checked.  PSYCHIATRIC: The patient is alert and oriented x 0.  SKIN: No obvious rash, lesion, or ulcer.    LABORATORY PANEL:   CBC  Recent Labs Lab 09/23/16 0532  WBC 16.6*  HGB 8.9*  HCT 25.3*  PLT 441*   ------------------------------------------------------------------------------------------------------------------  Chemistries   Recent Labs Lab 09/23/16 0532  NA 136  K 3.4*  CL 107  CO2 23  GLUCOSE 179*  BUN 11  CREATININE 0.60  CALCIUM 7.0*  MG 1.7   ------------------------------------------------------------------------------------------------------------------  Cardiac Enzymes  Recent Labs Lab 09/17/16 0726  TROPONINI 0.08*   ------------------------------------------------------------------------------------------------------------------  RADIOLOGY:  No results found.  EKG:   Orders placed or performed during the hospital encounter of 09/16/16  . EKG 12-Lead  . EKG 12-Lead    ASSESSMENT AND PLAN:  #Acute abdominal pain  secondary to bowel perforation status post emergency surgery for duodenal perforation, patient had gastrectomy with gastrojejunostomy with the duodenaldrain.  IV hydration.Patient has baseline  dementia.  Continue jejunal feeds Management for surgery  Pathology result confirmed no cancer.  # sepsis secondary to #1. Continue Zosyn , vanc , fluconazole and monitor blood counts Appreciate ID recommendations  #hypo-phosphetemia/hypokalemia  Replete, pharmacy consulted for electrolite management  as needed basis  # elevated troponin  secondary to #1.  # .Alzheimer's dementia.; Currently patient is altered Palliative care consulted- plan was to sent to hospice, but as no malignancy on report, surgery suggested to wait for 1-2 days for progress.  CODE STATUS is DNR  Poor prognosis  Patient is transferred to surgery service  All the records are reviewed and case discussed with Care Management/Social Workerr.  CODE STATUS: DNR TOTAL TIME TAKING CARE OF THIS PATIENT: 35 minutes.   POSSIBLE D/C IN 1-2 DAYS, DEPENDING ON CLINICAL CONDITION.   Vaughan Basta M.D on 09/23/2016 at 4:52 PM  Between 7am to 6pm - Pager - [phone].  After 6pm go to [email or web address] - password [email or web address] [facility]  Office  [phone]  CC: Primary care physician; Rusty Aus, MD   Note: This dictation was prepared with Dragon dictation along with smaller phrase technology. Any transcriptional errors that result from this process are unintentional.

## 2016-09-23 NOTE — Progress Notes (Signed)
POD # 7  s/p exploratory laparotomy with distal gastrectomy, gastrojejunostomy, duodenal stump closure with omental patch, duodenal drainage tube placement, Moss feeding tube placement. Her right-sided JP drain continues to have significant volume of bilious output. C diff +   Objective: Vital signs in last 24 hours: Temp:  [97.7 F (36.5 C)-97.9 F (36.6 C)] 97.9 F (36.6 C) (09/13 0444) Pulse Rate:  [86-88] 88 (09/13 0444) Resp:  [16] 16 (09/13 0444) BP: (141-153)/(61-63) 153/63 (09/13 0444) SpO2:  [98 %-100 %] 98 % (09/13 0444) Weight:  [50 kg (110 lb 4.8 oz)] 50 kg (110 lb 4.8 oz) (09/13 0344) Last BM Date: 09/23/16  Intake/Output from previous day: 09/12 0701 - 09/13 0700 In: 1103.3 [NG/GT:550; IV Piggyback:553.3] Out: 2252 [Urine:1305; Drains:942; Stool:5] Intake/Output this shift: Total I/O In: 80 [Other:80] Out: 540 [Urine:350; Drains:190]  Physical exam: Debilitated Abd: soft, Right tube w bile . Moss tube in place. Duodenostomy tube patent. No peritonitis, Midline incision healing well Lab Results: CBC   Recent Labs  09/21/16 0736 09/23/16 0532  WBC 17.3* 16.6*  HGB 8.9* 8.9*  HCT 26.5* 25.3*  PLT 401 441*   BMET  Recent Labs  09/22/16 0501 09/23/16 0532  NA 134* 136  K 3.5 3.4*  CL 108 107  CO2 22 23  GLUCOSE 190* 179*  BUN 10 11  CREATININE 0.57 0.60  CALCIUM 7.2* 7.0*   PT/INR No results for input(s): LABPROT, INR in the last 72 hours. ABG No results for input(s): PHART, HCO3 in the last 72 hours.  Invalid input(s): PCO2, PO2  Studies/Results: No results found.  Anti-infectives: Anti-infectives    Start     Dose/Rate Route Frequency Ordered Stop   09/23/16 1000  metroNIDAZOLE (FLAGYL) 50 mg/ml oral suspension 500 mg  Status:  Discontinued     500 mg Per Tube 3 times daily 09/23/16 0739 09/23/16 0743   09/23/16 1000  metroNIDAZOLE (FLAGYL) tablet 500 mg     500 mg Per Tube 3 times daily 09/23/16 0743     09/23/16 0745  vancomycin  (VANCOCIN) 50 mg/mL oral solution 125 mg     125 mg Per Tube Every 6 hours 09/23/16 0739     09/20/16 0800  fluconazole (DIFLUCAN) IVPB 200 mg     200 mg 100 mL/hr over 60 Minutes Intravenous Every 24 hours 09/19/16 0726     09/20/16 0800  vancomycin (VANCOCIN) IVPB 750 mg/150 ml premix     750 mg 150 mL/hr over 60 Minutes Intravenous Every 24 hours 09/19/16 0726     09/19/16 0800  fluconazole (DIFLUCAN) IVPB 400 mg     400 mg 100 mL/hr over 120 Minutes Intravenous  Once 09/19/16 0726 09/19/16 1002   09/19/16 0730  vancomycin (VANCOCIN) IVPB 1000 mg/200 mL premix     1,000 mg 200 mL/hr over 60 Minutes Intravenous  Once 09/19/16 0726 09/19/16 1201   09/18/16 1800  piperacillin-tazobactam (ZOSYN) IVPB 3.375 g     3.375 g 12.5 mL/hr over 240 Minutes Intravenous Every 8 hours 09/18/16 1303     09/17/16 2200  piperacillin-tazobactam (ZOSYN) IVPB 3.375 g  Status:  Discontinued     3.375 g 12.5 mL/hr over 240 Minutes Intravenous Every 12 hours 09/17/16 1223 09/18/16 1303   09/17/16 1100  cefTRIAXone (ROCEPHIN) 1 g in dextrose 5 % 50 mL IVPB  Status:  Discontinued     1 g 100 mL/hr over 30 Minutes Intravenous Every 24 hours 09/16/16 1211 09/16/16 1327   09/16/16 2200  piperacillin-tazobactam (ZOSYN) IVPB 3.375 g  Status:  Discontinued     3.375 g 12.5 mL/hr over 240 Minutes Intravenous Every 8 hours 09/16/16 1343 09/17/16 1223   09/16/16 1330  metroNIDAZOLE (FLAGYL) IVPB 500 mg     500 mg 100 mL/hr over 60 Minutes Intravenous  Once 09/16/16 1317 09/16/16 1424   09/16/16 1330  piperacillin-tazobactam (ZOSYN) IVPB 3.375 g  Status:  Discontinued     3.375 g 100 mL/hr over 30 Minutes Intravenous STAT 09/16/16 1327 09/16/16 2006   09/16/16 1130  cefTRIAXone (ROCEPHIN) 1 g in dextrose 5 % 50 mL IVPB     1 g 100 mL/hr over 30 Minutes Intravenous  Once 09/16/16 1115 09/16/16 1240      Assessment/Plan: C diff added vanc and flagyl po Continue enteral feeds and A/Bs PT Nutritional labs in  am   Caroleen Hamman, MD, FACS  09/23/2016

## 2016-09-23 NOTE — Consult Note (Signed)
MEDICATION RELATED CONSULT NOTE - INITIAL   Pharmacy Consult for electrolytes Indication: low phosphorus (refeeding)  Allergies  Allergen Reactions  . Carafate [Sucralfate] Other (See Comments)    Unknown, pt does not remember  . Lipitor [Atorvastatin] Other (See Comments)    Unknown, pt can't remember   . Mobic [Meloxicam] Other (See Comments)    Pt does not remember  . Penicillins Other (See Comments)    Pt does not remember  . Tramadol Other (See Comments)    Pt does not remember    Patient Measurements: Height: 5\' 4"  (162.6 cm) Weight: 110 lb 4.8 oz (50 kg) IBW/kg (Calculated) : 54.7 Adjusted Body Weight:   Vital Signs: Temp: 97.9 F (36.6 C) (09/13 0444) Temp Source: Oral (09/12 2009) BP: 153/63 (09/13 0444) Pulse Rate: 88 (09/13 0444) Intake/Output from previous day: 09/12 0701 - 09/13 0700 In: 1103.3 [NG/GT:550; IV Piggyback:553.3] Out: 2252 [Urine:1305; Drains:942; Stool:5] Intake/Output from this shift: No intake/output data recorded.  Labs:  Recent Labs  09/21/16 0519 09/21/16 0736 09/21/16 1836 09/22/16 0501 09/23/16 0532  WBC  --  17.3*  --   --  16.6*  HGB  --  8.9*  --   --  8.9*  HCT  --  26.5*  --   --  25.3*  PLT  --  401  --   --  441*  CREATININE  --  0.58  --  0.57 0.60  MG 1.8  --   --  1.7 1.7  PHOS 1.8*  --  2.7 2.3* 2.6   Estimated Creatinine Clearance: 36.9 mL/min (by C-G formula based on SCr of 0.6 mg/dL).   Microbiology: Recent Results (from the past 720 hour(s))  Urine Culture     Status: Abnormal   Collection Time: 09/16/16 10:23 AM  Result Value Ref Range Status   Specimen Description URINE, RANDOM  Final   Special Requests NONE  Final   Culture >=100,000 COLONIES/mL ESCHERICHIA COLI (A)  Final   Report Status 09/18/2016 FINAL  Final   Organism ID, Bacteria ESCHERICHIA COLI (A)  Final      Susceptibility   Escherichia coli - MIC*    AMPICILLIN >=32 RESISTANT Resistant     CEFAZOLIN 16 SENSITIVE Sensitive      CEFTRIAXONE <=1 SENSITIVE Sensitive     CIPROFLOXACIN <=0.25 SENSITIVE Sensitive     GENTAMICIN <=1 SENSITIVE Sensitive     IMIPENEM <=0.25 SENSITIVE Sensitive     NITROFURANTOIN <=16 SENSITIVE Sensitive     TRIMETH/SULFA <=20 SENSITIVE Sensitive     AMPICILLIN/SULBACTAM >=32 RESISTANT Resistant     PIP/TAZO <=4 SENSITIVE Sensitive     Extended ESBL NEGATIVE Sensitive     * >=100,000 COLONIES/mL ESCHERICHIA COLI  MRSA PCR Screening     Status: None   Collection Time: 09/16/16  7:56 PM  Result Value Ref Range Status   MRSA by PCR NEGATIVE NEGATIVE Final    Comment:        The GeneXpert MRSA Assay (FDA approved for NASAL specimens only), is one component of a comprehensive MRSA colonization surveillance program. It is not intended to diagnose MRSA infection nor to guide or monitor treatment for MRSA infections.   C difficile quick scan w PCR reflex     Status: Abnormal   Collection Time: 09/22/16  5:58 PM  Result Value Ref Range Status   C Diff antigen POSITIVE (A) NEGATIVE Final   C Diff toxin NEGATIVE NEGATIVE Final   C Diff interpretation Results are indeterminate.  See PCR results.  Final  Clostridium Difficile by PCR     Status: Abnormal   Collection Time: 09/22/16  5:58 PM  Result Value Ref Range Status   Toxigenic C Difficile by pcr POSITIVE (A) NEGATIVE Final    Comment: Positive for toxigenic C. difficile with little to no toxin production. Only treat if clinical presentation suggests symptomatic illness.    Medical History: Past Medical History:  Diagnosis Date  . Arthritis   . Blood clot embolism during pregnancy, antepartum 1957   left thigh  . Fibrocystic breast    left breast  . Gastritis   . History of arthroscopy of knee 1980's   right  . Hyperlipemia   . Lumbar stenosis    2 buldging disc  . Myocardial infarction ([place]) 2008    Medications:  Scheduled:  . enoxaparin (LOVENOX) injection  40 mg Subcutaneous Q24H  . free water  30 mL Per Tube Q6H   . insulin aspart  0-9 Units Subcutaneous Q4H  . octreotide  50 mcg Subcutaneous Q8H  . pantoprazole (PROTONIX) IV  40 mg Intravenous Q12H    Assessment: Patient is a 83 year old female with a bowl perforation s/p gastrectomy w/ GJ tube. Pt started on tube feeds. Pt has severe malnutrition and is at risk for refeeding syndrome. Pharmacy consulted to manage electrolytes.  This AM phos=2.6 K= 3.4 Mag =1.7  Goal of Therapy:  Normalization of electrolytes  Plan: K is slightly low. Maintenance fluids with KCL are no longer running. Will give KCL 10 MEQ IV x 3. Recheck in the AM  Ramond Dial, PharmD, BCPS Clinical Pharmacist 09/23/2016 7:25 AM

## 2016-09-23 NOTE — Progress Notes (Signed)
[facility] [facility] [place] NAME: Misty Lucas    MR#:  [id number]  DATE OF BIRTH:  1924-03-02    CHIEF COMPLAINT:   Chief Complaint  Patient presents with  . Abdominal Pain  . Constipation   Patient is Feeling fine ,no complaints, appears weak. Tube feeding is on, have discharge from the drainage tube.  REVIEW OF SYSTEMS:   Review of Systems  Constitutional: Positive for malaise/fatigue. Negative for chills and fever.  HENT: Negative for nosebleeds and tinnitus.   Respiratory: Negative for cough and shortness of breath.   Cardiovascular: Negative for chest pain and palpitations.  Gastrointestinal: Negative for nausea and vomiting.  Skin: Negative for itching and rash.  Neurological: Negative for dizziness, tingling and headaches.   confuse DRUG ALLERGIES:   Allergies  Allergen Reactions  . Carafate [Sucralfate] Other (See Comments)    Unknown, pt does not remember  . Lipitor [Atorvastatin] Other (See Comments)    Unknown, pt can't remember   . Mobic [Meloxicam] Other (See Comments)    Pt does not remember  . Penicillins Other (See Comments)    Pt does not remember  . Tramadol Other (See Comments)    Pt does not remember    VITALS:  Blood pressure (!) 152/58, pulse 88, temperature 98.6 F (37 C), temperature source Oral, resp. rate 20, height 5\' 4"  (1.626 m), weight 50 kg (110 lb 4.8 oz), SpO2 98 %.  PHYSICAL EXAMINATION:  GENERAL:  83 y.o.-year-old patient lying in the bed with no acute distress. Very confused and mumbling. EYES: Pupils equal, round, reactive to light  . No scleral icterus.  HEENT: Head atraumatic, normocephalic. Oropharynx and nasopharynx clear.  NECK:  Supple, no jugular venous distention. No thyroid enlargement, no tenderness.  LUNGS: Normal breath sounds bilaterally, no wheezing, rales,rhonchi or crepitation. No use of accessory muscles of respiration.  CARDIOVASCULAR: S1, S2 normal. No murmurs,  rubs, or gallops.  ABDOMEN;midline incision is clean under Honeycomb dressing. Abdominal binder is present Right-sided JP drain has blood-tinged fluid. Left JP   drain  Is serosanguineous.duodenal  Drain is bilious. EXTREMITIES: No pedal edema, cyanosis, or clubbing.  NEUROLOGIC: Cranial nerves II through XII are intact. Muscle strength 3/5 in all extremities. Sensation intact. Gait not checked.  PSYCHIATRIC: The patient is alert and oriented x 0.  SKIN: No obvious rash, lesion, or ulcer.    LABORATORY PANEL:   CBC  Recent Labs Lab 09/23/16 0532  WBC 16.6*  HGB 8.9*  HCT 25.3*  PLT 441*   ------------------------------------------------------------------------------------------------------------------  Chemistries   Recent Labs Lab 09/23/16 0532  NA 136  K 3.4*  CL 107  CO2 23  GLUCOSE 179*  BUN 11  CREATININE 0.60  CALCIUM 7.0*  MG 1.7   ------------------------------------------------------------------------------------------------------------------  Cardiac Enzymes  Recent Labs Lab 09/17/16 0726  TROPONINI 0.08*   ------------------------------------------------------------------------------------------------------------------  RADIOLOGY:  No results found.  EKG:   Orders placed or performed during the hospital encounter of 09/16/16  . EKG 12-Lead  . EKG 12-Lead    ASSESSMENT AND PLAN:  #Acute abdominal pain  secondary to bowel perforation status post emergency surgery for duodenal perforation, patient had gastrectomy with gastrojejunostomy with the duodenaldrain.  IV hydration.Patient has baseline  dementia.  Continue jejunal feeds Management for surgery  Pathology result confirmed no cancer.  # sepsis secondary to #1. Continue Zosyn , vanc , fluconazole and monitor blood counts Appreciate ID recommendations  # c diff colitis   Vanc via  feeding tube is started.  #hypo-phosphetemia/hypokalemia  Replete, pharmacy consulted for electrolite  management as needed basis  # elevated troponin secondary to #1.  # .Alzheimer's dementia.; Currently patient is altered Palliative care consulted- plan was to sent to hospice, but as no malignancy on report, surgery suggested to wait for 1-2 days for progress.  CODE STATUS is DNR  Poor prognosis  Patient is transferred to surgery service  All the records are reviewed and case discussed with Care Management/Social Workerr.  CODE STATUS: DNR TOTAL TIME TAKING CARE OF THIS PATIENT: 35 minutes.   POSSIBLE D/C IN 1-2 DAYS, DEPENDING ON CLINICAL CONDITION. Prognosis is very poor.  Vaughan Basta M.D on 09/23/2016 at 5:03 PM  Between 7am to 6pm - Pager - [phone].  After 6pm go to [email or web address] - password [email or web address] [facility]  Office  [phone]  CC: Primary care physician; Rusty Aus, MD   Note: This dictation was prepared with Dragon dictation along with smaller phrase technology. Any transcriptional errors that result from this process are unintentional.

## 2016-09-23 NOTE — Progress Notes (Signed)
Physical Therapy Treatment Patient Details Name: Misty Lucas MRN: [id number] DOB: 1924/03/01 Today's Date: 09/23/2016    History of Present Illness Misty Lucas is a 83 y.o. female who presents with abdominal pain.  She has a history of Alzheimer's Dementia. She has been dealing with constipation issues for a long time and has been having more recently upper abdominal pain for the past week.  She had been recently seen in the emergency room and was noted to be constipated with fecal impaction. She was disimpacted and her pain at that point had subsided but now has worsened again. She was initially admitted to the medical team today with concerns for possible UTI which is positive on her urinalysis. There was a concern for possible EKG changes compared to the EKG from last week. However CT scan of the abdomen was done which showed pneumoperitoneum with possible perforated peptic ulcer. Pt underwent emergency surgery and is now POD#2 s/p exploratory laparotomy, distal gastrectomy, gastrojejunostomy, duodenal stump closure with omental patch, duodenal drainage tube placement, and moss feeding tube placement. Pt has been more confused than her baseline and is currently with a sitter. Of note pt suffered a L hip fracture in August 2018 and underwent anterior approach hemiarthroplasty at [facility]. She was discharged to [facility]. During prior admission she was WBAT with anterior precautions.    PT Comments    Pt with willingness to do some exercises in bed but reports simply not feeling like trying to get up to sitting or attempting any standing activities.  She needed a lot of rest breaks and clearly fatigued with doing 10-15 reps of multiple different sets of supine LE exercises.  Overall pt did well but could not be motivated to try mobility.  Pt weak and generally uncomfortable, did not c/o excessive pain but general ache/pain/soreness in L hip and abdomen.   Follow Up Recommendations  SNF      Equipment Recommendations       Recommendations for Other Services       Precautions / Restrictions Precautions Precautions: Fall;Anterior Hip Restrictions LLE Weight Bearing: Weight bearing as tolerated    Mobility  Bed Mobility               General bed mobility comments: pt refuses, reports she only feels like doing exercises in bed  Transfers                    Ambulation/Gait                 Stairs            Wheelchair Mobility    Modified Rankin (Stroke Patients Only)       Balance                                            Cognition Arousal/Alertness: Awake/alert Behavior During Therapy: WFL for tasks assessed/performed Overall Cognitive Status: History of cognitive impairments - at baseline                                        Exercises General Exercises - Lower Extremity Ankle Circles/Pumps: Strengthening;15 reps Quad Sets: Strengthening;10 reps Short Arc Quad: Strengthening;Left;15 reps Heel Slides: Strengthening;10 reps Hip ABduction/ADduction: Strengthening;15 reps    General  Comments        Pertinent Vitals/Pain Pain Assessment: 0-10 Faces Pain Scale: Hurts little more Pain Location: reports general pain in L hip and across abdomen    Home Living                      Prior Function            PT Goals (current goals can now be found in the care plan section) Progress towards PT goals: Progressing toward goals    Frequency    Min 2X/week      PT Plan Current plan remains appropriate    Co-evaluation              AM-PAC PT "6 Clicks" Daily Activity  Outcome Measure  Difficulty turning over in bed (including adjusting bedclothes, sheets and blankets)?: Unable Difficulty moving from lying on back to sitting on the side of the bed? : Unable Difficulty sitting down on and standing up from a chair with arms (e.g., wheelchair, bedside commode,  etc,.)?: Unable Help needed moving to and from a bed to chair (including a wheelchair)?: Total Help needed walking in hospital room?: Total Help needed climbing 3-5 steps with a railing? : Total 6 Click Score: 6    End of Session Equipment Utilized During Treatment: Oxygen Activity Tolerance: Patient limited by pain;Patient limited by fatigue Patient left: with call bell/phone within reach;with bed alarm set Nurse Communication: Mobility status PT Visit Diagnosis: Unsteadiness on feet (R26.81);Muscle weakness (generalized) (M62.81);History of falling (Z91.81);Pain;Difficulty in walking, not elsewhere classified (R26.2)     Time: 4696-2952 PT Time Calculation (min) (ACUTE ONLY): 25 min  Charges:  $Therapeutic Exercise: 23-37 mins                    G Codes:       Kreg Shropshire, DPT 09/23/2016, 3:23 PM

## 2016-09-23 NOTE — Plan of Care (Signed)
Problem: Education: Goal: Knowledge of [facility] Education information/materials will improve Outcome: Not Progressing Patient needs assistance.  History of dementia.  Problem: Health Behavior/Discharge Planning: Goal: Ability to manage health-related needs will improve Outcome: Not Progressing Patient needs assistance.

## 2016-09-23 NOTE — Progress Notes (Signed)
Called MD regarding patient's G-tube with multiple ports. Per Dr. Dahlia Byes, ok to administer medications down the feeding port.

## 2016-09-24 LAB — GLUCOSE, CAPILLARY
GLUCOSE-CAPILLARY: 123 mg/dL — AB (ref 65–99)
GLUCOSE-CAPILLARY: 171 mg/dL — AB (ref 65–99)
GLUCOSE-CAPILLARY: 173 mg/dL — AB (ref 65–99)
Glucose-Capillary: 171 mg/dL — ABNORMAL HIGH (ref 65–99)
Glucose-Capillary: 191 mg/dL — ABNORMAL HIGH (ref 65–99)
Glucose-Capillary: 151 mg/dL — ABNORMAL HIGH (ref 65–99)

## 2016-09-24 LAB — COMPREHENSIVE METABOLIC PANEL
ALT: 14 U/L (ref 14–54)
ANION GAP: 7 (ref 5–15)
AST: 20 U/L (ref 15–41)
Albumin: 1.2 g/dL — ABNORMAL LOW (ref 3.5–5.0)
Alkaline Phosphatase: 82 U/L (ref 38–126)
BUN: 13 mg/dL (ref 6–20)
CHLORIDE: 104 mmol/L (ref 101–111)
CO2: 25 mmol/L (ref 22–32)
CREATININE: 0.61 mg/dL (ref 0.44–1.00)
Calcium: 7.3 mg/dL — ABNORMAL LOW (ref 8.9–10.3)
GFR calc non Af Amer: >60 mL/min (ref >60)
Glucose, Bld: 189 mg/dL — ABNORMAL HIGH (ref 65–99)
POTASSIUM: 3.8 mmol/L (ref 3.5–5.1)
SODIUM: 136 mmol/L (ref 135–145)
Total Bilirubin: 0.4 mg/dL (ref 0.3–1.2)
Total Protein: 5.4 g/dL — ABNORMAL LOW (ref 6.5–8.1)

## 2016-09-24 LAB — MAGNESIUM: MAGNESIUM: 1.8 mg/dL (ref 1.7–2.4)

## 2016-09-24 LAB — PREALBUMIN: Prealbumin: 5.7 mg/dL — ABNORMAL LOW (ref 18–38)

## 2016-09-24 LAB — PHOSPHORUS: PHOSPHORUS: 2.4 mg/dL — AB (ref 2.5–4.6)

## 2016-09-24 LAB — VANCOMYCIN, TROUGH: VANCOMYCIN TR: 11 ug/mL — AB (ref 15–20)

## 2016-09-24 MED ORDER — POTASSIUM & SODIUM PHOSPHATES 280-160-250 MG PO PACK
2.0000 | PACK | Freq: Three times a day (TID) | ORAL | Status: AC
  Administered 2016-09-24 (×4): 2
  Filled 2016-09-24 (×4): qty 2

## 2016-09-24 MED ORDER — VANCOMYCIN HCL IN DEXTROSE 750-5 MG/150ML-% IV SOLN
750.0000 mg | INTRAVENOUS | Status: DC
  Filled 2016-09-24: qty 150

## 2016-09-24 NOTE — Progress Notes (Signed)
Physical Therapy Treatment Patient Details Name: Misty Lucas MRN: [id number] DOB: 1924/10/01 Today's Date: 09/24/2016    History of Present Illness 83 y.o. female who presents with abdominal pain.  She has a history of Alzheimer's Dementia. She has been dealing with constipation issues for a long time and has been having more recently upper abdominal pain for the past week.  She had been recently seen in the emergency room and was noted to be constipated with fecal impaction. She was disimpacted and her pain at that point had subsided but now has worsened again. She was initially admitted to the medical team today with concerns for possible UTI which is positive on her urinalysis. There was a concern for possible EKG changes compared to the EKG from last week. However CT scan of the abdomen was done which showed pneumoperitoneum with possible perforated peptic ulcer. Pt underwent emergency surgery and is now POD#2 s/p exploratory laparotomy, distal gastrectomy, gastrojejunostomy, duodenal stump closure with omental patch, duodenal drainage tube placement, and moss feeding tube placement. Pt has been more confused than her baseline and is currently with a sitter. Of note pt suffered a L hip fracture in August 2018 and underwent anterior approach hemiarthroplasty at [facility]. She was discharged to [facility]. During prior admission she was WBAT with anterior precautions.    PT Comments    Pt again showed surprisingly good strength with most acts despite having been minimally active since arrival.  She did better with SLRs on R (as expected secondary to L hip replacement) but overall showed AROM against some resistance for all acts.  Pt needed some extra cuing/encouragement to stay motivated with exercises but ultimately did well.   Pt not willing to try getting up to EOB.  Follow Up Recommendations  SNF     Equipment Recommendations       Recommendations for Other Services       Precautions /  Restrictions Precautions Precautions: Fall;Anterior Hip Restrictions LLE Weight Bearing: Weight bearing as tolerated    Mobility  Bed Mobility               General bed mobility comments: Pt only willing to do supine bed exercises  Transfers                    Ambulation/Gait                 Stairs            Wheelchair Mobility    Modified Rankin (Stroke Patients Only)       Balance                                            Cognition Arousal/Alertness: Lethargic Behavior During Therapy: WFL for tasks assessed/performed Overall Cognitive Status: History of cognitive impairments - at baseline                                        Exercises General Exercises - Lower Extremity Ankle Circles/Pumps: Strengthening;15 reps Quad Sets: Strengthening;10 reps Short Arc Quad: Strengthening;Left;15 reps Heel Slides: Strengthening;10 reps Hip ABduction/ADduction: Strengthening;15 reps    General Comments        Pertinent Vitals/Pain Faces Pain Scale: Hurts little more Pain Location: c/o abdomainal pain, no hip  pain today    Home Living                      Prior Function            PT Goals (current goals can now be found in the care plan section) Progress towards PT goals: Progressing toward goals    Frequency    Min 2X/week      PT Plan Current plan remains appropriate    Co-evaluation              AM-PAC PT "6 Clicks" Daily Activity  Outcome Measure  Difficulty turning over in bed (including adjusting bedclothes, sheets and blankets)?: Unable Difficulty moving from lying on back to sitting on the side of the bed? : Unable Difficulty sitting down on and standing up from a chair with arms (e.g., wheelchair, bedside commode, etc,.)?: Unable Help needed moving to and from a bed to chair (including a wheelchair)?: Total Help needed walking in hospital room?: Total Help needed  climbing 3-5 steps with a railing? : Total 6 Click Score: 6    End of Session Equipment Utilized During Treatment: Oxygen Activity Tolerance: Patient limited by pain;Patient limited by fatigue Patient left: with call bell/phone within reach;with bed alarm set Nurse Communication: Mobility status PT Visit Diagnosis: Unsteadiness on feet (R26.81);Muscle weakness (generalized) (M62.81);History of falling (Z91.81);Pain;Difficulty in walking, not elsewhere classified (R26.2)     Time: 3846-6599 PT Time Calculation (min) (ACUTE ONLY): 12 min  Charges:  $Therapeutic Exercise: 8-22 mins                    G Codes:       Kreg Shropshire, DPT 09/24/2016, 11:16 AM

## 2016-09-24 NOTE — Consult Note (Signed)
MEDICATION RELATED CONSULT NOTE - INITIAL   Pharmacy Consult for electrolytes Indication: low phosphorus (refeeding)  Allergies  Allergen Reactions  . Carafate [Sucralfate] Other (See Comments)    Unknown, pt does not remember  . Lipitor [Atorvastatin] Other (See Comments)    Unknown, pt can't remember   . Mobic [Meloxicam] Other (See Comments)    Pt does not remember  . Penicillins Other (See Comments)    Pt does not remember  . Tramadol Other (See Comments)    Pt does not remember    Patient Measurements: Height: 5\' 4"  (162.6 cm) Weight: 110 lb 1.6 oz (49.9 kg) IBW/kg (Calculated) : 54.7 Adjusted Body Weight:   Vital Signs: Temp: 98.1 F (36.7 C) (09/14 0431) Temp Source: Oral (09/14 0431) BP: 153/63 (09/14 0431) Pulse Rate: 86 (09/14 0431) Intake/Output from previous day: 09/13 0701 - 09/14 0700 In: 2939.8 [NG/GT:2209.8; IV Piggyback:450] Out: 1055 [Urine:350; Drains:705] Intake/Output from this shift: No intake/output data recorded.  Labs:  Recent Labs  09/22/16 0501 09/23/16 0532 09/24/16 0427  WBC  --  16.6*  --   HGB  --  8.9*  --   HCT  --  25.3*  --   PLT  --  441*  --   CREATININE 0.57 0.60 0.61  MG 1.7 1.7 1.8  PHOS 2.3* 2.6 2.4*  ALBUMIN  --   --  1.2*  PROT  --   --  5.4*  AST  --   --  20  ALT  --   --  14  ALKPHOS  --   --  82  BILITOT  --   --  0.4   Estimated Creatinine Clearance: 36.8 mL/min (by C-G formula based on SCr of 0.61 mg/dL).   Microbiology: Recent Results (from the past 720 hour(s))  Urine Culture     Status: Abnormal   Collection Time: 09/16/16 10:23 AM  Result Value Ref Range Status   Specimen Description URINE, RANDOM  Final   Special Requests NONE  Final   Culture >=100,000 COLONIES/mL ESCHERICHIA COLI (A)  Final   Report Status 09/18/2016 FINAL  Final   Organism ID, Bacteria ESCHERICHIA COLI (A)  Final      Susceptibility   Escherichia coli - MIC*    AMPICILLIN >=32 RESISTANT Resistant     CEFAZOLIN 16  SENSITIVE Sensitive     CEFTRIAXONE <=1 SENSITIVE Sensitive     CIPROFLOXACIN <=0.25 SENSITIVE Sensitive     GENTAMICIN <=1 SENSITIVE Sensitive     IMIPENEM <=0.25 SENSITIVE Sensitive     NITROFURANTOIN <=16 SENSITIVE Sensitive     TRIMETH/SULFA <=20 SENSITIVE Sensitive     AMPICILLIN/SULBACTAM >=32 RESISTANT Resistant     PIP/TAZO <=4 SENSITIVE Sensitive     Extended ESBL NEGATIVE Sensitive     * >=100,000 COLONIES/mL ESCHERICHIA COLI  MRSA PCR Screening     Status: None   Collection Time: 09/16/16  7:56 PM  Result Value Ref Range Status   MRSA by PCR NEGATIVE NEGATIVE Final    Comment:        The GeneXpert MRSA Assay (FDA approved for NASAL specimens only), is one component of a comprehensive MRSA colonization surveillance program. It is not intended to diagnose MRSA infection nor to guide or monitor treatment for MRSA infections.   C difficile quick scan w PCR reflex     Status: Abnormal   Collection Time: 09/22/16  5:58 PM  Result Value Ref Range Status   C Diff antigen POSITIVE (A) NEGATIVE  Final   C Diff toxin NEGATIVE NEGATIVE Final   C Diff interpretation Results are indeterminate. See PCR results.  Final  Clostridium Difficile by PCR     Status: Abnormal   Collection Time: 09/22/16  5:58 PM  Result Value Ref Range Status   Toxigenic C Difficile by pcr POSITIVE (A) NEGATIVE Final    Comment: Positive for toxigenic C. difficile with little to no toxin production. Only treat if clinical presentation suggests symptomatic illness.    Medical History: Past Medical History:  Diagnosis Date  . Arthritis   . Blood clot embolism during pregnancy, antepartum 1957   left thigh  . Fibrocystic breast    left breast  . Gastritis   . History of arthroscopy of knee 1980's   right  . Hyperlipemia   . Lumbar stenosis    2 buldging disc  . Myocardial infarction ([place]) 2008    Medications:  Scheduled:  . enoxaparin (LOVENOX) injection  40 mg Subcutaneous Q24H  . free  water  30 mL Per Tube Q6H  . insulin aspart  0-9 Units Subcutaneous Q4H  . metroNIDAZOLE  500 mg Per Tube TID  . octreotide  50 mcg Subcutaneous Q8H  . pantoprazole (PROTONIX) IV  40 mg Intravenous Q12H  . potassium & sodium phosphates  2 packet Per Tube TID WC & HS  . vancomycin  125 mg Per Tube Q6H    Assessment: Patient is a 83 year old female with a bowl perforation s/p gastrectomy w/ GJ tube. Pt started on tube feeds. Pt has severe malnutrition and is at risk for refeeding syndrome. Pharmacy consulted to manage electrolytes.  This AM phos=2.4 K= 3.8 Mag =1.8  Goal of Therapy:  Normalization of electrolytes  Plan: Phos is slightly low. Will give kphos packet per tube TID AC, HS x 1 day. Recheck in the AM   Ramond Dial, PharmD, BCPS Clinical Pharmacist 09/24/2016 7:47 AM

## 2016-09-24 NOTE — Consult Note (Signed)
Pharmacy Antibiotic Note  Misty Lucas is a 83 y.o. female admitted on 09/16/2016 with perforated bowel.  Pharmacy has been consulted for vancomycin, zosyn and fluconazole dosing.  Plan: Vanc level resulted at 10. Will change dose to vancomycin 750mg  q 18 hours. Trough prior to the 4th new dose 9/17 @ 0730 Follow up recommendations from ID Continue fluconazole 200mg  daily Zosyn 3.375g q 8 hr EI infusion  Height: 5\' 4"  (162.6 cm) Weight: 110 lb 1.6 oz (49.9 kg) IBW/kg (Calculated) : 54.7  Temp (24hrs), Avg:98.3 F (36.8 C), Min:98.1 F (36.7 C), Max:98.6 F (37 C)   Recent Labs Lab 09/18/16 0346 09/19/16 0339 09/20/16 0423 09/21/16 0647 09/21/16 0736 09/22/16 0501 09/23/16 0532 09/24/16 0427 09/24/16 0735  WBC 19.6* 20.8* 16.8*  --  17.3*  --  16.6*  --   --   CREATININE 1.09* 0.88 0.79  --  0.58 0.57 0.60 0.61  --   VANCOTROUGH  --   --   --  10*  --   --   --   --  11*    Estimated Creatinine Clearance: 36.8 mL/min (by C-G formula based on SCr of 0.61 mg/dL).    Allergies  Allergen Reactions  . Carafate [Sucralfate] Other (See Comments)    Unknown, pt does not remember  . Lipitor [Atorvastatin] Other (See Comments)    Unknown, pt can't remember   . Mobic [Meloxicam] Other (See Comments)    Pt does not remember  . Penicillins Other (See Comments)    Pt does not remember  . Tramadol Other (See Comments)    Pt does not remember    Antimicrobials this admission: Zosyn 9/6>> Vancomycin 9/9>>  Fluconazole 9/9>>  Dose adjustments this admission:   Microbiology results:  9/6 UCx: ecoli   9/6 MRSA PCR: neg  Thank you for allowing pharmacy to be a part of this patient's care.  Ramond Dial, Pharm.D, BCPS Clinical Pharmacist  09/24/2016 1:57 PM

## 2016-09-24 NOTE — Progress Notes (Signed)
[facility] NAME: Misty Lucas    MR#:  [id number]  DATE OF BIRTH:  Jan 06, 1925    CHIEF COMPLAINT:   Chief Complaint  Patient presents with  . Abdominal Pain  . Constipation   Patient is Feeling fine ,no complaints, appears weak. Tube feeding is on, have discharge from the drainage tube.  REVIEW OF SYSTEMS:   Review of Systems  Constitutional: Positive for malaise/fatigue. Negative for chills and fever.  HENT: Negative for nosebleeds and tinnitus.   Respiratory: Negative for cough and shortness of breath.   Cardiovascular: Negative for chest pain and palpitations.  Gastrointestinal: Negative for nausea and vomiting.  Skin: Negative for itching and rash.  Neurological: Negative for dizziness, tingling and headaches.   confuse DRUG ALLERGIES:   Allergies  Allergen Reactions  . Carafate [Sucralfate] Other (See Comments)    Unknown, pt does not remember  . Lipitor [Atorvastatin] Other (See Comments)    Unknown, pt can't remember   . Mobic [Meloxicam] Other (See Comments)    Pt does not remember  . Penicillins Other (See Comments)    Pt does not remember  . Tramadol Other (See Comments)    Pt does not remember    VITALS:  Blood pressure 114/90, pulse 88, temperature 97.7 F (36.5 C), temperature source Oral, resp. rate 18, height 5\' 4"  (1.626 m), weight 49.9 kg (110 lb 1.6 oz), SpO2 99 %.  PHYSICAL EXAMINATION:  GENERAL:  83 y.o.-year-old patient lying in the bed with no acute distress. Very confused and mumbling. EYES: Pupils equal, round, reactive to light  . No scleral icterus.  HEENT: Head atraumatic, normocephalic. Oropharynx and nasopharynx clear.  NECK:  Supple, no jugular venous distention. No thyroid enlargement, no tenderness.  LUNGS: Normal breath sounds bilaterally, no wheezing, rales,rhonchi or crepitation. No use of accessory muscles of respiration.  CARDIOVASCULAR: S1, S2 normal. No murmurs,  rubs, or gallops.  ABDOMEN;midline incision is clean under Honeycomb dressing. Abdominal binder is present Right-sided JP drain has blood-tinged fluid. Left JP   drain  Is serosanguineous.duodenal  Drain is bilious. EXTREMITIES: No pedal edema, cyanosis, or clubbing.  NEUROLOGIC: Cranial nerves II through XII are intact. Muscle strength 3/5 in all extremities. Sensation intact. Gait not checked.  PSYCHIATRIC: The patient is alert and oriented x 0.  SKIN: No obvious rash, lesion, or ulcer.    LABORATORY PANEL:   CBC  Recent Labs Lab 09/23/16 0532  WBC 16.6*  HGB 8.9*  HCT 25.3*  PLT 441*   ------------------------------------------------------------------------------------------------------------------  Chemistries   Recent Labs Lab 09/24/16 0427  NA 136  K 3.8  CL 104  CO2 25  GLUCOSE 189*  BUN 13  CREATININE 0.61  CALCIUM 7.3*  MG 1.8  AST 20  ALT 14  ALKPHOS 82  BILITOT 0.4   ------------------------------------------------------------------------------------------------------------------  Cardiac Enzymes No results for input(s): TROPONINI in the last 168 hours. ------------------------------------------------------------------------------------------------------------------  RADIOLOGY:  No results found.  EKG:   Orders placed or performed during the hospital encounter of 09/16/16  . EKG 12-Lead  . EKG 12-Lead    ASSESSMENT AND PLAN:   # Acute abdominal pain  secondary to bowel perforation status post emergency surgery for duodenal perforation, patient had gastrectomy with gastrojejunostomy with the duodenaldrain.  IV hydration.Patient has baseline  dementia.  Continue jejunal feeds Management for surgery  Pathology result confirmed no cancer.  # sepsis secondary to #1. Continue Zosyn , vanc , fluconazole and monitor blood counts  Appreciate ID recommendations  # c diff colitis   Vanc via feeding tube is started.  #  hypo-phosphetemia/hypokalemia   Replete, pharmacy consulted for electrolite management as needed basis  # elevated troponin secondary to #1.  # Alzheimer's dementia.; Currently patient is altered Palliative care consulted- plan was to sent to hospice, but as no malignancy on report, surgery suggested to wait for 1-2 days for progress.  CODE STATUS is DNR  Poor prognosis  Patient is transferred to surgery service  All the records are reviewed and case discussed with Care Management/Social Worker.  CODE STATUS: DNR TOTAL TIME TAKING CARE OF THIS PATIENT: 35 minutes.   POSSIBLE D/C IN 1-2 DAYS, DEPENDING ON CLINICAL CONDITION. Prognosis is very poor.  Vaughan Basta M.D on 09/24/2016 at 4:32 PM  Between 7am to 6pm - Pager - [phone].  After 6pm go to [email or web address] - password [email or web address]  Office  [phone]  CC: Primary care physician; Rusty Aus, MD   Note: This dictation was prepared with Dragon dictation along with smaller phrase technology. Any transcriptional errors that result from this process are unintentional.

## 2016-09-24 NOTE — Progress Notes (Signed)
POD # 8 s/p exploratory laparotomy with distal gastrectomy, gastrojejunostomy, duodenal stump closure with omental patch, duodenal drainage tube placement, Moss feeding tube placement. Her right-sided JP drain continues to have significant volume of bilious output. C diff +     Objective: Vital signs in last 24 hours: Temp:  [98.1 F (36.7 C)-98.6 F (37 C)] 98.1 F (36.7 C) (09/14 0431) Pulse Rate:  [86-88] 86 (09/14 0431) Resp:  [20] 20 (09/13 2107) BP: (152-158)/(58-63) 153/63 (09/14 0431) SpO2:  [96 %-98 %] 96 % (09/14 0431) Weight:  [49.9 kg (110 lb 1.6 oz)] 49.9 kg (110 lb 1.6 oz) (09/14 0500) Last BM Date: 09/23/16  Intake/Output from previous day: 09/13 0701 - 09/14 0700 In: 2939.8 [NG/GT:2209.8; IV Piggyback:450] Out: 9470 [Urine:350; Drains:865] Intake/Output this shift: No intake/output data recorded.  Physical exam: Debilitated female in NAD, disoriented, answers simple questions Abd: soft. JP bilious drainage. Duodenostomy tube w bile. No peritonitis. Incision c/d/i.    Lab Results: CBC   Recent Labs  09/23/16 0532  WBC 16.6*  HGB 8.9*  HCT 25.3*  PLT 441*   BMET  Recent Labs  09/23/16 0532 09/24/16 0427  NA 136 136  K 3.4* 3.8  CL 107 104  CO2 23 25  GLUCOSE 179* 189*  BUN 11 13  CREATININE 0.60 0.61  CALCIUM 7.0* 7.3*   PT/INR  Recent Labs  09/23/16 1455  LABPROT 15.5*  INR 1.24   ABG No results for input(s): PHART, HCO3 in the last 72 hours.  Invalid input(s): PCO2, PO2  Studies/Results: No results found.  Anti-infectives: Anti-infectives    Start     Dose/Rate Route Frequency Ordered Stop   09/23/16 1000  metroNIDAZOLE (FLAGYL) 50 mg/ml oral suspension 500 mg  Status:  Discontinued     500 mg Per Tube 3 times daily 09/23/16 0739 09/23/16 0743   09/23/16 1000  metroNIDAZOLE (FLAGYL) tablet 500 mg     500 mg Per Tube 3 times daily 09/23/16 0743     09/23/16 0745  vancomycin (VANCOCIN) 50 mg/mL oral solution 125 mg     125 mg Per Tube Every 6 hours 09/23/16 0739     09/20/16 0800  fluconazole (DIFLUCAN) IVPB 200 mg     200 mg 100 mL/hr over 60 Minutes Intravenous Every 24 hours 09/19/16 0726     09/20/16 0800  vancomycin (VANCOCIN) IVPB 750 mg/150 ml premix     750 mg 150 mL/hr over 60 Minutes Intravenous Every 24 hours 09/19/16 0726     09/19/16 0800  fluconazole (DIFLUCAN) IVPB 400 mg     400 mg 100 mL/hr over 120 Minutes Intravenous  Once 09/19/16 0726 09/19/16 1002   09/19/16 0730  vancomycin (VANCOCIN) IVPB 1000 mg/200 mL premix     1,000 mg 200 mL/hr over 60 Minutes Intravenous  Once 09/19/16 0726 09/19/16 1201   09/18/16 1800  piperacillin-tazobactam (ZOSYN) IVPB 3.375 g     3.375 g 12.5 mL/hr over 240 Minutes Intravenous Every 8 hours 09/18/16 1303     09/17/16 2200  piperacillin-tazobactam (ZOSYN) IVPB 3.375 g  Status:  Discontinued     3.375 g 12.5 mL/hr over 240 Minutes Intravenous Every 12 hours 09/17/16 1223 09/18/16 1303   09/17/16 1100  cefTRIAXone (ROCEPHIN) 1 g in dextrose 5 % 50 mL IVPB  Status:  Discontinued     1 g 100 mL/hr over 30 Minutes Intravenous Every 24 hours 09/16/16 1211 09/16/16 1327   09/16/16 2200  piperacillin-tazobactam (ZOSYN) IVPB 3.375 g  Status:  Discontinued     3.375 g 12.5 mL/hr over 240 Minutes Intravenous Every 8 hours 09/16/16 1343 09/17/16 1223   09/16/16 1330  metroNIDAZOLE (FLAGYL) IVPB 500 mg     500 mg 100 mL/hr over 60 Minutes Intravenous  Once 09/16/16 1317 09/16/16 1424   09/16/16 1330  piperacillin-tazobactam (ZOSYN) IVPB 3.375 g  Status:  Discontinued     3.375 g 100 mL/hr over 30 Minutes Intravenous STAT 09/16/16 1327 09/16/16 2006   09/16/16 1130  cefTRIAXone (ROCEPHIN) 1 g in dextrose 5 % 50 mL IVPB     1 g 100 mL/hr over 30 Minutes Intravenous  Once 09/16/16 1115 09/16/16 1240      Assessment/Plan: Duodenal fistula. NPO, keep drains, octreotide, duodenostomy tube, may need NGT to help decrease biliary output continue A/Bs and enteral  feeds Continue rx for C diff PT Poor prognosis given her severe pr/cal malnutrition and recent major surgery May not survive but currently is to early to tell. No major aggressive re intervetnions   Caroleen Hamman, MD, [facility]  09/24/2016

## 2016-09-24 NOTE — Care Management Important Message (Signed)
Important Message  Patient Details  Name: Misty Lucas MRN: [id number] Date of Birth: December 17, 1924   Medicare Important Message Given:  Yes    Beverly Sessions, RN 09/24/2016, 10:30 AM

## 2016-09-24 NOTE — Progress Notes (Signed)
[place] INFECTIOUS DISEASE PROGRESS NOTE Date of Admission:  09/16/2016     ID: Misty Lucas is a 83 y.o. female with intrabdominal abscess Active Problems:   Sepsis ([place])   Elevated troponin I level   Palliative care by specialist   Encounter for hospice care discussion   Subjective: No fever, wbc down some.  Still frail. C diff +  ROS  Eleven systems are reviewed and negative except per hpi  Medications:  Antibiotics Given (last 72 hours)    Date/Time Action Medication Dose Rate   09/21/16 1753 New Bag/Given   piperacillin-tazobactam (ZOSYN) IVPB 3.375 g 3.375 g 12.5 mL/hr   09/22/16 0216 New Bag/Given   piperacillin-tazobactam (ZOSYN) IVPB 3.375 g 3.375 g 12.5 mL/hr   09/22/16 0921 New Bag/Given   vancomycin (VANCOCIN) IVPB 750 mg/150 ml premix 750 mg 150 mL/hr   09/22/16 1025 New Bag/Given   piperacillin-tazobactam (ZOSYN) IVPB 3.375 g 3.375 g 12.5 mL/hr   09/22/16 1809 New Bag/Given   piperacillin-tazobactam (ZOSYN) IVPB 3.375 g 3.375 g 12.5 mL/hr   09/23/16 0207 New Bag/Given   piperacillin-tazobactam (ZOSYN) IVPB 3.375 g 3.375 g 12.5 mL/hr   09/23/16 0910 New Bag/Given   piperacillin-tazobactam (ZOSYN) IVPB 3.375 g 3.375 g 12.5 mL/hr   09/23/16 0910 New Bag/Given   vancomycin (VANCOCIN) IVPB 750 mg/150 ml premix 750 mg 150 mL/hr   09/23/16 1002 Given  [ok to administer med via feeding port of tube per Dr. Dahlia Byes   vancomycin (VANCOCIN) 50 mg/mL oral solution 125 mg 125 mg    09/23/16 1002 Given   metroNIDAZOLE (FLAGYL) tablet 500 mg 500 mg    09/23/16 1702 Given   vancomycin (VANCOCIN) 50 mg/mL oral solution 125 mg 125 mg    09/23/16 1703 Given   metroNIDAZOLE (FLAGYL) tablet 500 mg 500 mg    09/23/16 1849 New Bag/Given   piperacillin-tazobactam (ZOSYN) IVPB 3.375 g 3.375 g 12.5 mL/hr   09/23/16 2225 Given   vancomycin (VANCOCIN) 50 mg/mL oral solution 125 mg 125 mg    09/23/16 2225 Given   metroNIDAZOLE (FLAGYL) tablet 500 mg 500 mg    09/24/16 0112  New Bag/Given   piperacillin-tazobactam (ZOSYN) IVPB 3.375 g 3.375 g 12.5 mL/hr   09/24/16 0523 Given   vancomycin (VANCOCIN) 50 mg/mL oral solution 125 mg 125 mg    09/24/16 0815 New Bag/Given   vancomycin (VANCOCIN) IVPB 750 mg/150 ml premix 750 mg 150 mL/hr   09/24/16 0954 New Bag/Given   piperacillin-tazobactam (ZOSYN) IVPB 3.375 g 3.375 g 12.5 mL/hr   09/24/16 0955 Given   vancomycin (VANCOCIN) 50 mg/mL oral solution 125 mg 125 mg    09/24/16 0956 Given   metroNIDAZOLE (FLAGYL) tablet 500 mg 500 mg      . enoxaparin (LOVENOX) injection  40 mg Subcutaneous Q24H  . free water  30 mL Per Tube Q6H  . insulin aspart  0-9 Units Subcutaneous Q4H  . metroNIDAZOLE  500 mg Per Tube TID  . octreotide  50 mcg Subcutaneous Q8H  . pantoprazole (PROTONIX) IV  40 mg Intravenous Q12H  . potassium & sodium phosphates  2 packet Per Tube TID WC & HS  . vancomycin  125 mg Per Tube Q6H    Objective: Vital signs in last 24 hours: Temp:  [97.7 F (36.5 C)-98.6 F (37 C)] 97.7 F (36.5 C) (09/14 1450) Pulse Rate:  [86-88] 88 (09/14 1450) Resp:  [18-20] 18 (09/14 1450) BP: (114-158)/(58-90) 114/90 (09/14 1450) SpO2:  [96 %-99 %] 99 % (  09/14 1450) Weight:  [49.9 kg (110 lb 1.6 oz)] 49.9 kg (110 lb 1.6 oz) (09/14 0500) Constitutional:  Thin, frail, lying in bed, alert  HENT: /AT, PERRLA, no scleral icterus Mouth/Throat: Oropharynx is clear and dry. No oropharyngeal exudate.  Cardiovascular: Normal rate, regular rhythm and normal heart sounds Pulmonary/Chest: Effort normal and breath sounds normal. No respiratory distress.  has no wheezes.  Neck = supple, no nuchal rigidity Abdominal: Soft. J tube and drain in L abd with thin dark secretions and one on R with bilious drainage  Lymphadenopathy: no cervical adenopathy. No axillary adenopathy Neurological: alert but disoriented Skin: Skin is warm and dry. No rash noted. No erythema.  Psychiatric: a normal mood and affect.  behavior is  normal  Lab Results  Recent Labs  09/23/16 0532 09/24/16 0427  WBC 16.6*  --   HGB 8.9*  --   HCT 25.3*  --   NA 136 136  K 3.4* 3.8  CL 107 104  CO2 23 25  BUN 11 13  CREATININE 0.60 0.61    Microbiology: Results for orders placed or performed during the hospital encounter of 09/16/16  Urine Culture     Status: Abnormal   Collection Time: 09/16/16 10:23 AM  Result Value Ref Range Status   Specimen Description URINE, RANDOM  Final   Special Requests NONE  Final   Culture >=100,000 COLONIES/mL ESCHERICHIA COLI (A)  Final   Report Status 09/18/2016 FINAL  Final   Organism ID, Bacteria ESCHERICHIA COLI (A)  Final      Susceptibility   Escherichia coli - MIC*    AMPICILLIN >=32 RESISTANT Resistant     CEFAZOLIN 16 SENSITIVE Sensitive     CEFTRIAXONE <=1 SENSITIVE Sensitive     CIPROFLOXACIN <=0.25 SENSITIVE Sensitive     GENTAMICIN <=1 SENSITIVE Sensitive     IMIPENEM <=0.25 SENSITIVE Sensitive     NITROFURANTOIN <=16 SENSITIVE Sensitive     TRIMETH/SULFA <=20 SENSITIVE Sensitive     AMPICILLIN/SULBACTAM >=32 RESISTANT Resistant     PIP/TAZO <=4 SENSITIVE Sensitive     Extended ESBL NEGATIVE Sensitive     * >=100,000 COLONIES/mL ESCHERICHIA COLI  MRSA PCR Screening     Status: None   Collection Time: 09/16/16  7:56 PM  Result Value Ref Range Status   MRSA by PCR NEGATIVE NEGATIVE Final    Comment:        The GeneXpert MRSA Assay (FDA approved for NASAL specimens only), is one component of a comprehensive MRSA colonization surveillance program. It is not intended to diagnose MRSA infection nor to guide or monitor treatment for MRSA infections.   C difficile quick scan w PCR reflex     Status: Abnormal   Collection Time: 09/22/16  5:58 PM  Result Value Ref Range Status   C Diff antigen POSITIVE (A) NEGATIVE Final   C Diff toxin NEGATIVE NEGATIVE Final   C Diff interpretation Results are indeterminate. See PCR results.  Final  Clostridium Difficile by PCR      Status: Abnormal   Collection Time: 09/22/16  5:58 PM  Result Value Ref Range Status   Toxigenic C Difficile by pcr POSITIVE (A) NEGATIVE Final    Comment: Positive for toxigenic C. difficile with little to no toxin production. Only treat if clinical presentation suggests symptomatic illness.    Studies/Results: No results found.  Assessment/Plan: FLORANCE PAOLILLO is a 83 y.o. female with dementia admitted with abd pain and found to have perforation of duodenum and  has had surgery with drains in place. She is afebrile and HD stable but wbc remains elevated. On zosyn but wbc elevated and had addition of vanco and fluconazole. WBC decreased 20-16 since addition of these abx.  MRSA PCR neg She is very frail and palliative care consult is following. Path neg for cancer.  Cd ffi +  Recommendations Continue current zosyn and fluconazole Can dc IV vanco Start oral vanco for C diff Can narrow abx based on hospital course.  Thank you very much for the consult. Will follow with you.  Leonel Ramsay   09/24/2016, 3:06 PM

## 2016-09-25 LAB — BASIC METABOLIC PANEL
Anion gap: 7 (ref 5–15)
BUN: 12 mg/dL (ref 6–20)
CALCIUM: 7.2 mg/dL — AB (ref 8.9–10.3)
CO2: 29 mmol/L (ref 22–32)
CREATININE: 0.7 mg/dL (ref 0.44–1.00)
Chloride: 101 mmol/L (ref 101–111)
GFR calc Af Amer: >60 mL/min (ref >60)
Glucose, Bld: 183 mg/dL — ABNORMAL HIGH (ref 65–99)
POTASSIUM: 4.2 mmol/L (ref 3.5–5.1)
SODIUM: 137 mmol/L (ref 135–145)

## 2016-09-25 LAB — CBC
HCT: 28.5 % — ABNORMAL LOW (ref 35.0–47.0)
Hemoglobin: 9.7 g/dL — ABNORMAL LOW (ref 12.0–16.0)
MCH: 29.2 pg (ref 26.0–34.0)
MCHC: 33.9 g/dL (ref 32.0–36.0)
MCV: 86 fL (ref 80.0–100.0)
PLATELETS: 685 10*3/uL — AB (ref 150–440)
RBC: 3.31 MIL/uL — AB (ref 3.80–5.20)
RDW: 14.5 % (ref 11.5–14.5)
WBC: 17.2 10*3/uL — AB (ref 3.6–11.0)

## 2016-09-25 LAB — GLUCOSE, CAPILLARY
GLUCOSE-CAPILLARY: 160 mg/dL — AB (ref 65–99)
GLUCOSE-CAPILLARY: 201 mg/dL — AB (ref 65–99)
GLUCOSE-CAPILLARY: 212 mg/dL — AB (ref 65–99)
Glucose-Capillary: 172 mg/dL — ABNORMAL HIGH (ref 65–99)
Glucose-Capillary: 134 mg/dL — ABNORMAL HIGH (ref 65–99)
Glucose-Capillary: 127 mg/dL — ABNORMAL HIGH (ref 65–99)
Glucose-Capillary: 193 mg/dL — ABNORMAL HIGH (ref 65–99)

## 2016-09-25 LAB — PHOSPHORUS: Phosphorus: 3.7 mg/dL (ref 2.5–4.6)

## 2016-09-25 NOTE — Progress Notes (Signed)
Subjective: No new events Wbc elevated nml creat Objective: Vital signs in last 24 hours: Temp:  [97.7 F (36.5 C)-100 F (37.8 C)] 98.5 F (36.9 C) (09/15 0031) Pulse Rate:  [85-94] 94 (09/15 0031) Resp:  [18] 18 (09/15 0031) BP: (114-151)/(49-90) 151/82 (09/15 0031) SpO2:  [99 %-100 %] 100 % (09/15 0031) Weight:  [46.9 kg (103 lb 4.8 oz)] 46.9 kg (103 lb 4.8 oz) (09/15 0031) Last BM Date: 09/24/16  Intake/Output from previous day: 09/14 0701 - 09/15 0700 In: 1120.4 [NG/GT:1120.4] Out: 900 [Drains:900] Intake/Output this shift: No intake/output data recorded.  Physical exam: Debilitated, demented Abd: soft, JP w bile. Staples intact. Duodenostomy tube in place No peritonitis  Lab Results: CBC   Recent Labs  09/23/16 0532 09/25/16 0503  WBC 16.6* 17.2*  HGB 8.9* 9.7*  HCT 25.3* 28.5*  PLT 441* 685*   BMET  Recent Labs  09/24/16 0427 09/25/16 0503  NA 136 137  K 3.8 4.2  CL 104 101  CO2 25 29  GLUCOSE 189* 183*  BUN 13 12  CREATININE 0.61 0.70  CALCIUM 7.3* 7.2*   PT/INR  Recent Labs  09/23/16 1455  LABPROT 15.5*  INR 1.24   ABG No results for input(s): PHART, HCO3 in the last 72 hours.  Invalid input(s): PCO2, PO2  Studies/Results: No results found.  Anti-infectives: Anti-infectives    Start     Dose/Rate Route Frequency Ordered Stop   09/25/16 0200  vancomycin (VANCOCIN) IVPB 750 mg/150 ml premix  Status:  Discontinued     750 mg 150 mL/hr over 60 Minutes Intravenous Every 18 hours 09/24/16 1355 09/24/16 1506   09/23/16 1000  metroNIDAZOLE (FLAGYL) 50 mg/ml oral suspension 500 mg  Status:  Discontinued     500 mg Per Tube 3 times daily 09/23/16 0739 09/23/16 0743   09/23/16 1000  metroNIDAZOLE (FLAGYL) tablet 500 mg     500 mg Per Tube 3 times daily 09/23/16 0743     09/23/16 0745  vancomycin (VANCOCIN) 50 mg/mL oral solution 125 mg     125 mg Per Tube Every 6 hours 09/23/16 0739     09/20/16 0800  fluconazole (DIFLUCAN) IVPB 200  mg     200 mg 100 mL/hr over 60 Minutes Intravenous Every 24 hours 09/19/16 0726     09/20/16 0800  vancomycin (VANCOCIN) IVPB 750 mg/150 ml premix  Status:  Discontinued     750 mg 150 mL/hr over 60 Minutes Intravenous Every 24 hours 09/19/16 0726 09/24/16 1355   09/19/16 0800  fluconazole (DIFLUCAN) IVPB 400 mg     400 mg 100 mL/hr over 120 Minutes Intravenous  Once 09/19/16 0726 09/19/16 1002   09/19/16 0730  vancomycin (VANCOCIN) IVPB 1000 mg/200 mL premix     1,000 mg 200 mL/hr over 60 Minutes Intravenous  Once 09/19/16 0726 09/19/16 1201   09/18/16 1800  piperacillin-tazobactam (ZOSYN) IVPB 3.375 g     3.375 g 12.5 mL/hr over 240 Minutes Intravenous Every 8 hours 09/18/16 1303     09/17/16 2200  piperacillin-tazobactam (ZOSYN) IVPB 3.375 g  Status:  Discontinued     3.375 g 12.5 mL/hr over 240 Minutes Intravenous Every 12 hours 09/17/16 1223 09/18/16 1303   09/17/16 1100  cefTRIAXone (ROCEPHIN) 1 g in dextrose 5 % 50 mL IVPB  Status:  Discontinued     1 g 100 mL/hr over 30 Minutes Intravenous Every 24 hours 09/16/16 1211 09/16/16 1327   09/16/16 2200  piperacillin-tazobactam (ZOSYN) IVPB 3.375 g  Status:  Discontinued     3.375 g 12.5 mL/hr over 240 Minutes Intravenous Every 8 hours 09/16/16 1343 09/17/16 1223   09/16/16 1330  metroNIDAZOLE (FLAGYL) IVPB 500 mg     500 mg 100 mL/hr over 60 Minutes Intravenous  Once 09/16/16 1317 09/16/16 1424   09/16/16 1330  piperacillin-tazobactam (ZOSYN) IVPB 3.375 g  Status:  Discontinued     3.375 g 100 mL/hr over 30 Minutes Intravenous STAT 09/16/16 1327 09/16/16 2006   09/16/16 1130  cefTRIAXone (ROCEPHIN) 1 g in dextrose 5 % 50 mL IVPB     1 g 100 mL/hr over 30 Minutes Intravenous  Once 09/16/16 1115 09/16/16 1240      Assessment/Plan: Continue current care If persistent inc wbc will re scan Misty Hamman, MD, FACS  09/25/2016

## 2016-09-25 NOTE — Progress Notes (Signed)
[facility] [place] NAME: Misty Lucas    MR#:  [id number]  DATE OF BIRTH:  04/24/1924  Patient is alert, awake, told one of her friends that she wanted to go home. WBC is up but no diarrhea. No further diarrhea, 2 JP drains still draining one on the right side has bilious stuff, one on the left side has serous drainage  CHIEF COMPLAINT:   Chief Complaint  Patient presents with  . Abdominal Pain  . Constipation    REVIEW OF SYSTEMS:   Review of Systems  Constitutional: Positive for malaise/fatigue. Negative for chills and fever.  HENT: Negative for nosebleeds and tinnitus.   Respiratory: Negative for cough and shortness of breath.   Cardiovascular: Negative for chest pain and palpitations.  Gastrointestinal: Negative for nausea and vomiting.  Skin: Negative for itching and rash.  Neurological: Negative for dizziness, tingling and headaches.   confuse DRUG ALLERGIES:   Allergies  Allergen Reactions  . Carafate [Sucralfate] Other (See Comments)    Unknown, pt does not remember  . Lipitor [Atorvastatin] Other (See Comments)    Unknown, pt can't remember   . Mobic [Meloxicam] Other (See Comments)    Pt does not remember  . Penicillins Other (See Comments)    Pt does not remember  . Tramadol Other (See Comments)    Pt does not remember    VITALS:  Blood pressure (!) 151/82, pulse 94, temperature 98.5 F (36.9 C), temperature source Oral, resp. rate 18, height 5\' 4"  (1.626 m), weight 46.9 kg (103 lb 4.8 oz), SpO2 100 %.  PHYSICAL EXAMINATION:  GENERAL:  83 y.o.-year-old patient lying in the bed with no acute distress. Very confused and mumbling. EYES: Pupils equal, round, reactive to light  . No scleral icterus.  HEENT: Head atraumatic, normocephalic. Oropharynx and nasopharynx clear.  NECK:  Supple, no jugular venous distention. No thyroid enlargement, no tenderness.  LUNGS: Normal breath sounds bilaterally, no wheezing,  rales,rhonchi or crepitation. No use of accessory muscles of respiration.  CARDIOVASCULAR: S1, S2 normal. No murmurs, rubs, or gallops.  ABDOMEN;midline incision is clean under Honeycomb dressing. Abdominal binder is present Right-sided JP drain has blood-tinged fluid. Left JP   drain  Is serosanguineous.duodenal  Drain is bilious. EXTREMITIES: No pedal edema, cyanosis, or clubbing.  NEUROLOGIC: Cranial nerves II through XII are intact. Muscle strength 3/5 in all extremities. Sensation intact. Gait not checked.  PSYCHIATRIC: The patient is alert and oriented x 0.  SKIN: No obvious rash, lesion, or ulcer.    LABORATORY PANEL:   CBC  Recent Labs Lab 09/25/16 0503  WBC 17.2*  HGB 9.7*  HCT 28.5*  PLT 685*   ------------------------------------------------------------------------------------------------------------------  Chemistries   Recent Labs Lab 09/24/16 0427 09/25/16 0503  NA 136 137  K 3.8 4.2  CL 104 101  CO2 25 29  GLUCOSE 189* 183*  BUN 13 12  CREATININE 0.61 0.70  CALCIUM 7.3* 7.2*  MG 1.8  --   AST 20  --   ALT 14  --   ALKPHOS 82  --   BILITOT 0.4  --    ------------------------------------------------------------------------------------------------------------------  Cardiac Enzymes No results for input(s): TROPONINI in the last 168 hours. ------------------------------------------------------------------------------------------------------------------  RADIOLOGY:  No results found.  EKG:   Orders placed or performed during the hospital encounter of 09/16/16  . EKG 12-Lead  . EKG 12-Lead    ASSESSMENT AND PLAN:   # Acute abdominal pain  secondary to bowel  perforation status post emergency surgery for duodenal perforation, patient had gastrectomy with gastrojejunostomy with the duodenaldrain.  IV hydration.Patient has baseline  dementia.  Continue jejunal feeds Management for surgery  Pathology result confirmed no cancer.  # sepsis  secondary to #1. Continue Zosyn , vanc , flagyl # c diff colitis   Vanc via feeding tube is started. Already on Flagyl also. WBC up. Possible rescanning of  abdomen as per surgery. # hypo-phosphetemia/hypokalemia   Replete, pharmacy consulted for electrolite management as needed basis  # elevated troponin secondary to #1.  # Alzheimer's dementia.; Currently patient is alert and communicating. Palliative care consulted- plan was to sent to hospice, but as no malignancy on report, surgery suggested to wait for 1-2 days for progress. Further management and final disposition as per surgery. CODE STATUS is DNR  Poor prognosis  Patient is transferred to surgery service  All the records are reviewed and case discussed with Care Management/Social Worker.  CODE STATUS: DNR TOTAL TIME TAKING CARE OF THIS PATIENT: 35 minutes.   POSSIBLE D/C IN 1-2 DAYS, DEPENDING ON CLINICAL CONDITION. Prognosis is very poor.  Epifanio Lesches M.D on 09/25/2016 at 9:50 AM  Between 7am to 6pm - Pager - [phone].  After 6pm go to [email or web address] - password [email or web address] [facility]  Office  [phone]  CC: Primary care physician; Rusty Aus, MD   Note: This dictation was prepared with Dragon dictation along with smaller phrase technology. Any transcriptional errors that result from this process are unintentional.

## 2016-09-25 NOTE — Consult Note (Signed)
MEDICATION RELATED CONSULT NOTE - INITIAL   Pharmacy Consult for electrolytes Indication: low phosphorus (refeeding)  Allergies  Allergen Reactions  . Carafate [Sucralfate] Other (See Comments)    Unknown, pt does not remember  . Lipitor [Atorvastatin] Other (See Comments)    Unknown, pt can't remember   . Mobic [Meloxicam] Other (See Comments)    Pt does not remember  . Penicillins Other (See Comments)    Pt does not remember  . Tramadol Other (See Comments)    Pt does not remember    Patient Measurements: Height: 5\' 4"  (162.6 cm) Weight: 103 lb 4.8 oz (46.9 kg) IBW/kg (Calculated) : 54.7 Adjusted Body Weight:   Vital Signs: Temp: 98.5 F (36.9 C) (09/15 0031) Temp Source: Oral (09/15 0031) BP: 151/82 (09/15 0031) Pulse Rate: 94 (09/15 0031) Intake/Output from previous day: 09/14 0701 - 09/15 0700 In: 1120.4 [NG/GT:1120.4] Out: 900 [Drains:900] Intake/Output from this shift: No intake/output data recorded.  Labs:  Recent Labs  09/23/16 0532 09/24/16 0427 09/25/16 0503  WBC 16.6*  --  17.2*  HGB 8.9*  --  9.7*  HCT 25.3*  --  28.5*  PLT 441*  --  685*  CREATININE 0.60 0.61 0.70  MG 1.7 1.8  --   PHOS 2.6 2.4* 3.7  ALBUMIN  --  1.2*  --   PROT  --  5.4*  --   AST  --  20  --   ALT  --  14  --   ALKPHOS  --  82  --   BILITOT  --  0.4  --    Estimated Creatinine Clearance: 34.6 mL/min (by C-G formula based on SCr of 0.7 mg/dL).   Microbiology: Recent Results (from the past 720 hour(s))  Urine Culture     Status: Abnormal   Collection Time: 09/16/16 10:23 AM  Result Value Ref Range Status   Specimen Description URINE, RANDOM  Final   Special Requests NONE  Final   Culture >=100,000 COLONIES/mL ESCHERICHIA COLI (A)  Final   Report Status 09/18/2016 FINAL  Final   Organism ID, Bacteria ESCHERICHIA COLI (A)  Final      Susceptibility   Escherichia coli - MIC*    AMPICILLIN >=32 RESISTANT Resistant     CEFAZOLIN 16 SENSITIVE Sensitive      CEFTRIAXONE <=1 SENSITIVE Sensitive     CIPROFLOXACIN <=0.25 SENSITIVE Sensitive     GENTAMICIN <=1 SENSITIVE Sensitive     IMIPENEM <=0.25 SENSITIVE Sensitive     NITROFURANTOIN <=16 SENSITIVE Sensitive     TRIMETH/SULFA <=20 SENSITIVE Sensitive     AMPICILLIN/SULBACTAM >=32 RESISTANT Resistant     PIP/TAZO <=4 SENSITIVE Sensitive     Extended ESBL NEGATIVE Sensitive     * >=100,000 COLONIES/mL ESCHERICHIA COLI  MRSA PCR Screening     Status: None   Collection Time: 09/16/16  7:56 PM  Result Value Ref Range Status   MRSA by PCR NEGATIVE NEGATIVE Final    Comment:        The GeneXpert MRSA Assay (FDA approved for NASAL specimens only), is one component of a comprehensive MRSA colonization surveillance program. It is not intended to diagnose MRSA infection nor to guide or monitor treatment for MRSA infections.   C difficile quick scan w PCR reflex     Status: Abnormal   Collection Time: 09/22/16  5:58 PM  Result Value Ref Range Status   C Diff antigen POSITIVE (A) NEGATIVE Final   C Diff toxin NEGATIVE NEGATIVE Final  C Diff interpretation Results are indeterminate. See PCR results.  Final  Clostridium Difficile by PCR     Status: Abnormal   Collection Time: 09/22/16  5:58 PM  Result Value Ref Range Status   Toxigenic C Difficile by pcr POSITIVE (A) NEGATIVE Final    Comment: Positive for toxigenic C. difficile with little to no toxin production. Only treat if clinical presentation suggests symptomatic illness.    Medical History: Past Medical History:  Diagnosis Date  . Arthritis   . Blood clot embolism during pregnancy, antepartum 1957   left thigh  . Fibrocystic breast    left breast  . Gastritis   . History of arthroscopy of knee 1980's   right  . Hyperlipemia   . Lumbar stenosis    2 buldging disc  . Myocardial infarction ([place]) 2008    Medications:  Scheduled:  . enoxaparin (LOVENOX) injection  40 mg Subcutaneous Q24H  . free water  30 mL Per Tube Q6H   . insulin aspart  0-9 Units Subcutaneous Q4H  . metroNIDAZOLE  500 mg Per Tube TID  . octreotide  50 mcg Subcutaneous Q8H  . pantoprazole (PROTONIX) IV  40 mg Intravenous Q12H  . vancomycin  125 mg Per Tube Q6H    Assessment: Patient is a 82 year old female with a bowl perforation s/p gastrectomy w/ GJ tube. Pt started on tube feeds. Pt has severe malnutrition and is at risk for refeeding syndrome. Pharmacy consulted to manage electrolytes.  All labs within normal limits.   Goal of Therapy:  Normalization of electrolytes  Plan: No supplementation warranted @ this time. Recheck labs in 2 days.   Larene Beach, PharmD, BCPS Clinical Pharmacist 09/25/2016 8:11 AM

## 2016-09-26 LAB — CBC
HCT: 27.9 % — ABNORMAL LOW (ref 35.0–47.0)
Hemoglobin: 9.5 g/dL — ABNORMAL LOW (ref 12.0–16.0)
MCH: 29.2 pg (ref 26.0–34.0)
MCHC: 34.2 g/dL (ref 32.0–36.0)
MCV: 85.4 fL (ref 80.0–100.0)
PLATELETS: 776 10*3/uL — AB (ref 150–440)
RBC: 3.27 MIL/uL — AB (ref 3.80–5.20)
RDW: 14.7 % — ABNORMAL HIGH (ref 11.5–14.5)
WBC: 14.9 10*3/uL — ABNORMAL HIGH (ref 3.6–11.0)

## 2016-09-26 LAB — BASIC METABOLIC PANEL
Anion gap: 6 (ref 5–15)
BUN: 10 mg/dL (ref 6–20)
CALCIUM: 7.3 mg/dL — AB (ref 8.9–10.3)
CO2: 29 mmol/L (ref 22–32)
CREATININE: 0.75 mg/dL (ref 0.44–1.00)
Chloride: 100 mmol/L — ABNORMAL LOW (ref 101–111)
GFR calc Af Amer: >60 mL/min (ref >60)
GFR calc non Af Amer: >60 mL/min (ref >60)
GLUCOSE: 190 mg/dL — AB (ref 65–99)
Potassium: 4.6 mmol/L (ref 3.5–5.1)
Sodium: 135 mmol/L (ref 135–145)

## 2016-09-26 LAB — GLUCOSE, CAPILLARY
GLUCOSE-CAPILLARY: 163 mg/dL — AB (ref 65–99)
Glucose-Capillary: 199 mg/dL — ABNORMAL HIGH (ref 65–99)
Glucose-Capillary: 193 mg/dL — ABNORMAL HIGH (ref 65–99)
Glucose-Capillary: 195 mg/dL — ABNORMAL HIGH (ref 65–99)
Glucose-Capillary: 174 mg/dL — ABNORMAL HIGH (ref 65–99)

## 2016-09-26 LAB — PHOSPHORUS: Phosphorus: 3.1 mg/dL (ref 2.5–4.6)

## 2016-09-26 NOTE — Consult Note (Signed)
MEDICATION RELATED CONSULT NOTE -FOLLOW UP  Pharmacy Consult for electrolytes Indication: low phosphorus (refeeding)  Allergies  Allergen Reactions  . Carafate [Sucralfate] Other (See Comments)    Unknown, pt does not remember  . Lipitor [Atorvastatin] Other (See Comments)    Unknown, pt can't remember   . Mobic [Meloxicam] Other (See Comments)    Pt does not remember  . Penicillins Other (See Comments)    Pt does not remember  . Tramadol Other (See Comments)    Pt does not remember    Patient Measurements: Height: 5\' 4"  (162.6 cm) Weight: 103 lb 4.8 oz (46.9 kg) IBW/kg (Calculated) : 54.7 Adjusted Body Weight:   Vital Signs: Temp: 98.2 F (36.8 C) (09/16 0447) Temp Source: Oral (09/16 0447) BP: 164/58 (09/16 0447) Pulse Rate: 83 (09/16 0447) Intake/Output from previous day: 09/15 0701 - 09/16 0700 In: 1423.9 [NG/GT:1223.9; IV Piggyback:200] Out: 610 [Drains:610] Intake/Output from this shift: No intake/output data recorded.  Labs:  Recent Labs  09/24/16 0427 09/25/16 0503 09/26/16 0413  WBC  --  17.2* 14.9*  HGB  --  9.7* 9.5*  HCT  --  28.5* 27.9*  PLT  --  685* 776*  CREATININE 0.61 0.70 0.75  MG 1.8  --   --   PHOS 2.4* 3.7 3.1  ALBUMIN 1.2*  --   --   PROT 5.4*  --   --   AST 20  --   --   ALT 14  --   --   ALKPHOS 82  --   --   BILITOT 0.4  --   --    Estimated Creatinine Clearance: 34.6 mL/min (by C-G formula based on SCr of 0.75 mg/dL).   Microbiology: Recent Results (from the past 720 hour(s))  Urine Culture     Status: Abnormal   Collection Time: 09/16/16 10:23 AM  Result Value Ref Range Status   Specimen Description URINE, RANDOM  Final   Special Requests NONE  Final   Culture >=100,000 COLONIES/mL ESCHERICHIA COLI (A)  Final   Report Status 09/18/2016 FINAL  Final   Organism ID, Bacteria ESCHERICHIA COLI (A)  Final      Susceptibility   Escherichia coli - MIC*    AMPICILLIN >=32 RESISTANT Resistant     CEFAZOLIN 16 SENSITIVE  Sensitive     CEFTRIAXONE <=1 SENSITIVE Sensitive     CIPROFLOXACIN <=0.25 SENSITIVE Sensitive     GENTAMICIN <=1 SENSITIVE Sensitive     IMIPENEM <=0.25 SENSITIVE Sensitive     NITROFURANTOIN <=16 SENSITIVE Sensitive     TRIMETH/SULFA <=20 SENSITIVE Sensitive     AMPICILLIN/SULBACTAM >=32 RESISTANT Resistant     PIP/TAZO <=4 SENSITIVE Sensitive     Extended ESBL NEGATIVE Sensitive     * >=100,000 COLONIES/mL ESCHERICHIA COLI  MRSA PCR Screening     Status: None   Collection Time: 09/16/16  7:56 PM  Result Value Ref Range Status   MRSA by PCR NEGATIVE NEGATIVE Final    Comment:        The GeneXpert MRSA Assay (FDA approved for NASAL specimens only), is one component of a comprehensive MRSA colonization surveillance program. It is not intended to diagnose MRSA infection nor to guide or monitor treatment for MRSA infections.   C difficile quick scan w PCR reflex     Status: Abnormal   Collection Time: 09/22/16  5:58 PM  Result Value Ref Range Status   C Diff antigen POSITIVE (A) NEGATIVE Final   C Diff toxin  NEGATIVE NEGATIVE Final   C Diff interpretation Results are indeterminate. See PCR results.  Final  Clostridium Difficile by PCR     Status: Abnormal   Collection Time: 09/22/16  5:58 PM  Result Value Ref Range Status   Toxigenic C Difficile by pcr POSITIVE (A) NEGATIVE Final    Comment: Positive for toxigenic C. difficile with little to no toxin production. Only treat if clinical presentation suggests symptomatic illness.    Medical History: Past Medical History:  Diagnosis Date  . Arthritis   . Blood clot embolism during pregnancy, antepartum 1957   left thigh  . Fibrocystic breast    left breast  . Gastritis   . History of arthroscopy of knee 1980's   right  . Hyperlipemia   . Lumbar stenosis    2 buldging disc  . Myocardial infarction ([place]) 2008    Medications:  Scheduled:  . enoxaparin (LOVENOX) injection  40 mg Subcutaneous Q24H  . free water  30 mL  Per Tube Q6H  . insulin aspart  0-9 Units Subcutaneous Q4H  . metroNIDAZOLE  500 mg Per Tube TID  . octreotide  50 mcg Subcutaneous Q8H  . pantoprazole (PROTONIX) IV  40 mg Intravenous Q12H  . vancomycin  125 mg Per Tube Q6H    Assessment: Patient is a 83 year old female with a bowl perforation s/p gastrectomy w/ GJ tube. Pt started on tube feeds. Pt has severe malnutrition and is at risk for refeeding syndrome. Pharmacy consulted to manage electrolytes.  All labs within normal limits.   Goal of Therapy:  Normalization of electrolytes  Plan: No supplementation warranted @ this time. Recheck labs in 2 days.   Larene Beach, PharmD  Clinical Pharmacist 09/26/2016 7:33 AM

## 2016-09-26 NOTE — Progress Notes (Signed)
Per Dr. Dahlia Byes place Duodenal tube to low intermittent suction.

## 2016-09-26 NOTE — Progress Notes (Signed)
CC:  POD # 9 s/p exploratory laparotomy with distal gastrectomy, gastrojejunostomy, duodenal stump closure with omental patch, duodenal drainage tube placement, Moss feeding tube placement  Subjective: No major changs, taking TF. 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 Left Moss tube in place duodenal tube in place w bile   Lab Results: CBC   Recent Labs  09/25/16 0503 09/26/16 0413  WBC 17.2* 14.9*  HGB 9.7* 9.5*  HCT 28.5* 27.9*  PLT 685* 776*   BMET  Recent Labs  09/25/16 0503 09/26/16 0413  NA 137 135  K 4.2 4.6  CL 101 100*  CO2 29 29  GLUCOSE 183* 190*  BUN 12 10  CREATININE 0.70 0.75  CALCIUM 7.2* 7.3*   PT/INR  Recent Labs  09/23/16 1455  LABPROT 15.5*  INR 1.24   ABG No results for input(s): PHART, HCO3 in the last 72 hours.  Invalid input(s): PCO2, PO2  Studies/Results: No results found.  Anti-infectives: Anti-infectives    Start     Dose/Rate Route Frequency Ordered Stop   09/25/16 0200  vancomycin (VANCOCIN) IVPB 750 mg/150 ml premix  Status:  Discontinued     750 mg 150 mL/hr over 60 Minutes Intravenous Every 18 hours 09/24/16 1355 09/24/16 1506   09/23/16 1000  metroNIDAZOLE (FLAGYL) 50 mg/ml oral suspension 500 mg  Status:  Discontinued     500 mg Per Tube 3 times daily 09/23/16 0739 09/23/16 0743   09/23/16 1000  metroNIDAZOLE (FLAGYL)  tablet 500 mg     500 mg Per Tube 3 times daily 09/23/16 0743     09/23/16 0745  vancomycin (VANCOCIN) 50 mg/mL oral solution 125 mg     125 mg Per Tube Every 6 hours 09/23/16 0739     09/20/16 0800  fluconazole (DIFLUCAN) IVPB 200 mg     200 mg 100 mL/hr over 60 Minutes Intravenous Every 24 hours 09/19/16 0726     09/20/16 0800  vancomycin (VANCOCIN) IVPB 750 mg/150 ml premix  Status:  Discontinued     750 mg 150 mL/hr over 60 Minutes Intravenous Every 24 hours 09/19/16 0726 09/24/16 1355   09/19/16 0800  fluconazole (DIFLUCAN) IVPB 400 mg     400 mg 100 mL/hr over 120 Minutes Intravenous  Once 09/19/16 0726 09/19/16 1002   09/19/16 0730  vancomycin (VANCOCIN) IVPB 1000 mg/200 mL premix     1,000 mg 200 mL/hr over 60 Minutes Intravenous  Once 09/19/16 0726 09/19/16 1201   09/18/16 1800  piperacillin-tazobactam (ZOSYN) IVPB 3.375 g     3.375 g 12.5 mL/hr over 240 Minutes Intravenous Every 8 hours 09/18/16 1303     09/17/16 2200  piperacillin-tazobactam (ZOSYN) IVPB 3.375 g  Status:  Discontinued     3.375 g 12.5 mL/hr over 240 Minutes Intravenous Every 12 hours 09/17/16 1223 09/18/16 1303   09/17/16 1100  cefTRIAXone (ROCEPHIN) 1 g in dextrose 5 % 50  mL IVPB  Status:  Discontinued     1 g 100 mL/hr over 30 Minutes Intravenous Every 24 hours 09/16/16 1211 09/16/16 1327   09/16/16 2200  piperacillin-tazobactam (ZOSYN) IVPB 3.375 g  Status:  Discontinued     3.375 g 12.5 mL/hr over 240 Minutes Intravenous Every 8 hours 09/16/16 1343 09/17/16 1223   09/16/16 1330  metroNIDAZOLE (FLAGYL) IVPB 500 mg     500 mg 100 mL/hr over 60 Minutes Intravenous  Once 09/16/16 1317 09/16/16 1424   09/16/16 1330  piperacillin-tazobactam (ZOSYN) IVPB 3.375 g  Status:  Discontinued     3.375 g 100 mL/hr over 30 Minutes Intravenous STAT 09/16/16 1327 09/16/16 2006   09/16/16 1130  cefTRIAXone (ROCEPHIN) 1 g in dextrose 5 % 50 mL IVPB     1 g 100 mL/hr over 30 Minutes Intravenous  Once 09/16/16 1115  09/16/16 1240      Assessment/Plan: Hovering w/o major changes  I will put the duodenostomy tube to suction Keep TF and NPO Keep octreotide, A/Bs   Caroleen Hamman, MD, FACS  09/26/2016

## 2016-09-26 NOTE — Progress Notes (Signed)
Per Dr. Dahlia Byes okay to place order for NPO except ice chips. Also verified free water is suppose to be 35ml q 6 hours instead of the q 4. Will correct care order instruction.

## 2016-09-26 NOTE — Progress Notes (Signed)
[facility] NAME: Misty Lucas    MR#:  [id number]  DATE OF BIRTH:  09/07/1924  Patient says she slept well last night, denies any abdominal pain. Appears slightly confused.   CHIEF COMPLAINT:   Chief Complaint  Patient presents with  . Abdominal Pain  . Constipation    REVIEW OF SYSTEMS:   Review of Systems  Constitutional: Negative for chills, fever and malaise/fatigue.  HENT: Negative for hearing loss, nosebleeds and tinnitus.   Eyes: Negative for blurred vision, double vision and photophobia.  Respiratory: Negative for cough, hemoptysis and shortness of breath.   Cardiovascular: Negative for chest pain, palpitations, orthopnea and leg swelling.  Gastrointestinal: Negative for abdominal pain, diarrhea, nausea and vomiting.  Genitourinary: Negative for dysuria and urgency.  Musculoskeletal: Negative for myalgias and neck pain.  Skin: Negative for itching and rash.  Neurological: Negative for dizziness, tingling, focal weakness, seizures, weakness and headaches.  Psychiatric/Behavioral: Negative for memory loss. The patient does not have insomnia.    confuse DRUG ALLERGIES:   Allergies  Allergen Reactions  . Carafate [Sucralfate] Other (See Comments)    Unknown, pt does not remember  . Lipitor [Atorvastatin] Other (See Comments)    Unknown, pt can't remember   . Mobic [Meloxicam] Other (See Comments)    Pt does not remember  . Penicillins Other (See Comments)    Pt does not remember  . Tramadol Other (See Comments)    Pt does not remember    VITALS:  Blood pressure (!) 164/58, pulse 83, temperature 98.2 F (36.8 C), temperature source Oral, resp. rate 18, height 5\' 4"  (1.626 m), weight 46.9 kg (103 lb 4.8 oz), SpO2 96 %.  PHYSICAL EXAMINATION:  GENERAL:  83 y.o.-year-old patient lying in the bed with no acute distress. Very confused and mumbling. EYES: Pupils equal, round, reactive to light  . No scleral  icterus.  HEENT: Head atraumatic, normocephalic. Oropharynx and nasopharynx clear.  NECK:  Supple, no jugular venous distention. No thyroid enlargement, no tenderness.  LUNGS: Normal breath sounds bilaterally, no wheezing, rales,rhonchi or crepitation. No use of accessory muscles of respiration.  CARDIOVASCULAR: S1, S2 normal. No murmurs, rubs, or gallops.  ABDOMEN;midline incision is clean under Honeycomb dressing. Abdominal binder is present Right-sided JP drain has blood-tinged fluid. Left JP   drain  Is serosanguineous.duodenal  Drain is bilious. EXTREMITIES: No pedal edema, cyanosis, or clubbing.  NEUROLOGIC: Cranial nerves II through XII are intact. Muscle strength 3/5 in all extremities. Sensation intact. Gait not checked.  PSYCHIATRIC: The patient is alert and oriented x 0.  SKIN: No obvious rash, lesion, or ulcer.    LABORATORY PANEL:   CBC  Recent Labs Lab 09/26/16 0413  WBC 14.9*  HGB 9.5*  HCT 27.9*  PLT 776*   ------------------------------------------------------------------------------------------------------------------  Chemistries   Recent Labs Lab 09/24/16 0427  09/26/16 0413  NA 136  < > 135  K 3.8  < > 4.6  CL 104  < > 100*  CO2 25  < > 29  GLUCOSE 189*  < > 190*  BUN 13  < > 10  CREATININE 0.61  < > 0.75  CALCIUM 7.3*  < > 7.3*  MG 1.8  --   --   AST 20  --   --   ALT 14  --   --   ALKPHOS 82  --   --   BILITOT 0.4  --   --   < > =  values in this interval not displayed. ------------------------------------------------------------------------------------------------------------------  Cardiac Enzymes No results for input(s): TROPONINI in the last 168 hours. ------------------------------------------------------------------------------------------------------------------  RADIOLOGY:  No results found.  EKG:   Orders placed or performed during the hospital encounter of 09/16/16  . EKG 12-Lead  . EKG 12-Lead    ASSESSMENT AND PLAN:   #  Acute abdominal pain  secondary to bowel perforation status post emergency surgery for duodenal perforation, patient had gastrectomy with gastrojejunostomy with the duodenaldrain. Patient has baseline  dementia.  Continue jejunal feeds, tolerating them well. Management for surgery  Pathology result confirmed no cancer.  # sepsis secondary to #1. Continue Zosyn , vanc , flagyl # c diff colitis   Vanc via feeding tube is started. Already on Flagyl also. Leukocytosis is improving. . # hypo-phosphetemia/hypokalemia   Replete, pharmacy consulted for electrolite management as needed basis  # elevated troponin secondary to #1.  # Alzheimer's dementia.; Currently patient is alert and communicating. Palliative care consulted- plan was to sent to hospice, but as no malignancy on report, surgery suggested to wait for 1-2 days for progress. Physical therapy today  Further management and final disposition as per surgery. CODE STATUS is DNR  Poor prognosis  Patient is transferred to surgery service  All the records are reviewed and case discussed with Care Management/Social Worker.  CODE STATUS: DNR TOTAL TIME TAKING CARE OF THIS PATIENT: 35 minutes.   POSSIBLE D/C IN 1-2 DAYS, DEPENDING ON CLINICAL CONDITION. Prognosis is very poor.  Epifanio Lesches M.D on 09/26/2016 at 8:48 AM  Between 7am to 6pm - Pager - [phone].  After 6pm go to [email or web address] - password [email or web address] [facility]  Office  [phone]  CC: Primary care physician; Rusty Aus, MD   Note: This dictation was prepared with Dragon dictation along with smaller phrase technology. Any transcriptional errors that result from this process are unintentional.

## 2016-09-26 NOTE — Clinical Social Work Note (Signed)
CSW contacted the patient's son, Misty Lucas, to discuss bed offers. Misty Lucas stated that the choice is [facility]. The CSW updated the choice in the HUB. CSW will continue to follow for discharge facilitation.  Santiago Bumpers, MSW, Latanya Presser [phone]

## 2016-09-27 LAB — GLUCOSE, CAPILLARY
GLUCOSE-CAPILLARY: 120 mg/dL — AB (ref 65–99)
Glucose-Capillary: 145 mg/dL — ABNORMAL HIGH (ref 65–99)
Glucose-Capillary: 153 mg/dL — ABNORMAL HIGH (ref 65–99)
Glucose-Capillary: 179 mg/dL — ABNORMAL HIGH (ref 65–99)
Glucose-Capillary: 188 mg/dL — ABNORMAL HIGH (ref 65–99)
Glucose-Capillary: 210 mg/dL — ABNORMAL HIGH (ref 65–99)

## 2016-09-27 LAB — COMPREHENSIVE METABOLIC PANEL
ALT: 10 U/L — AB (ref 14–54)
ANION GAP: 6 (ref 5–15)
AST: 16 U/L (ref 15–41)
Albumin: 1.5 g/dL — ABNORMAL LOW (ref 3.5–5.0)
Alkaline Phosphatase: 68 U/L (ref 38–126)
BUN: 10 mg/dL (ref 6–20)
CHLORIDE: 99 mmol/L — AB (ref 101–111)
CO2: 29 mmol/L (ref 22–32)
CREATININE: 0.85 mg/dL (ref 0.44–1.00)
Calcium: 7.5 mg/dL — ABNORMAL LOW (ref 8.9–10.3)
GFR calc non Af Amer: 59 mL/min — ABNORMAL LOW (ref >60)
Glucose, Bld: 125 mg/dL — ABNORMAL HIGH (ref 65–99)
Potassium: 4.3 mmol/L (ref 3.5–5.1)
Sodium: 134 mmol/L — ABNORMAL LOW (ref 135–145)
Total Bilirubin: 0.4 mg/dL (ref 0.3–1.2)
Total Protein: 5.6 g/dL — ABNORMAL LOW (ref 6.5–8.1)

## 2016-09-27 LAB — PHOSPHORUS: Phosphorus: 3 mg/dL (ref 2.5–4.6)

## 2016-09-27 LAB — PROTIME-INR
INR: 1.32
PROTHROMBIN TIME: 16.3 s — AB (ref 11.4–15.2)

## 2016-09-27 LAB — CBC
HCT: 29.4 % — ABNORMAL LOW (ref 35.0–47.0)
HEMOGLOBIN: 9.9 g/dL — AB (ref 12.0–16.0)
MCH: 29 pg (ref 26.0–34.0)
MCHC: 33.6 g/dL (ref 32.0–36.0)
MCV: 86.1 fL (ref 80.0–100.0)
PLATELETS: 940 10*3/uL — AB (ref 150–440)
RBC: 3.42 MIL/uL — ABNORMAL LOW (ref 3.80–5.20)
RDW: 15.4 % — ABNORMAL HIGH (ref 11.5–14.5)
WBC: 14.8 10*3/uL — AB (ref 3.6–11.0)

## 2016-09-27 LAB — APTT: aPTT: 37 seconds — ABNORMAL HIGH (ref 24–36)

## 2016-09-27 LAB — MAGNESIUM: MAGNESIUM: 1.9 mg/dL (ref 1.7–2.4)

## 2016-09-27 NOTE — Clinical Social Work Note (Signed)
CSW spoke with patient's daughter in law, who arrived this afternoon stating that she and her husband were both confused and that the plan and the communication changes from one surgeon to the next. CSW has nursing page the current attending surgeon to speak further with her. In addition, CSW explained the different levels of care between SNF and LTAC and what each can manage and not manage. CSW explained that the surgeon informed RN CM and CSW this morning that she would need to more than likely discharge on an octeocide drip and this is not able to be done at a SNF. She verbalized understanding. Shela Leff MSW,LCSW [phone]

## 2016-09-27 NOTE — Progress Notes (Signed)
Nutrition Follow-up  DOCUMENTATION CODES:   Severe malnutrition in context of chronic illness, Underweight  INTERVENTION:   Recommend check copper, iodine, and selenium levels in 60 days as pt is at risk for malabsorption r/t tube feeds bypassing the stomach and duodenum.   Continue Osmolite 1.2 at 55 ml/hr. Goal regimen provides 1584 kcal, 73 grams of protein, 1082 ml H2O daily.  Free water flush of 30 ml Q6hrs. Will provide total of 1202 ml H2O when patient is at goal TF rate.  NUTRITION DIAGNOSIS:   Malnutrition (Severe) related to chronic illness (decreased appetite with dementia and advanced age, chronic constipation, now duodenal perforation possibly from malignancy) as evidenced by severe depletion of body fat, severe depletion of muscle mass.  Ongoing - addressing with tube feeds  GOAL:   Patient will meet greater than or equal to 90% of their needs  -meeting with tube feeds  MONITOR:   Diet advancement, Labs, Weight trends, TF tolerance, I & O's  ASSESSMENT:   83 year old female with PMHx of dementia, gastritis, HLD, lumbar stenosis, hx MI 2008, arthritis, chronic constipation, surgical hx of appendectomy and abdominal hysterectomy who presented with abdominal pain admitted with sepsis and found to have pneumoperitoneum with duodenal perforation. Patient s/p exploratory laparotomy, distal gastrectomy, duodenal stump closure with omental patch, retrocolic gastrojejunostomy, Moss GJ tube placement into efferent gastrojejunostomy limb, retrograde duodenal drainage tube placement on 9/6. Pt also with recent hip fracture in August  Pt tolerating tube feeds well at goal. Biopsies negative. Pt continues on Octreotide. Diarrhea resolved. C-Diff positive 9/12. Fistula output decreased. Per chart, pt remains weight stable. Duodenostomy tube to low suction. Pt working with physical therapy who recommends skilled nursing, family requests [facility].   Medications reviewed and  include: lovenox, insulin, metronidazole, octreotide, protonix, diflucan, zosyn, vancomycin  Labs reviewed: Na 134(L), Cl 99(L), Ca 7.5(L) adj. 9.5 wnl, P 3.0 wnl, Mg 1.9 wnl, alb 1.5(L) Prealbumin 5.7(L)- 9/14 Wbc- 14.8(H), Hgb 9.9(L), Hct 29.4(L) cbgs- 190, 125 x 48hrs  Diet Order:  Diet NPO time specified Except for: Ice Chips  Skin:  Wound (see comment) (closed incisions to abdomen and left hip)  Last BM:  9/15  Height:   Ht Readings from Last 1 Encounters:  09/16/16 5\' 4"  (1.626 m)    Weight:   Wt Readings from Last 1 Encounters:  09/27/16 107 lb 12.8 oz (48.9 kg)    Ideal Body Weight:  54.5 kg  BMI:  Body mass index is 18.5 kg/m.  Estimated Nutritional Needs:   Kcal:  1385-1615 (30-35 kcal/kg)  Protein:  70-83 grams (1.5-1.8 grams/kg)  Fluid:  1.1-1.4 L/day (25-30 ml/kg)  EDUCATION NEEDS:   No education needs identified at this time  Koleen Distance MS, RD, [place] Pager #[phone] After Hours Pager: [phone]

## 2016-09-27 NOTE — Progress Notes (Addendum)
[facility] Day(s): 11.   Post op day(s): 11 Days Post-Op.   Interval History: Patient seen and examined, no acute events or new complaints overnight. Patient reports ongoing +flatus and +BM, denies abdominal pain, N/V, fever/chills, CP, or SOB, has been tolerating jejunal tube feeds.  Review of Systems:  Constitutional: denies fever, chills  HEENT: denies cough or congestion  Respiratory: denies any shortness of breath  Cardiovascular: denies chest pain or palpitations  Gastrointestinal: abdominal pain, N/V, and bowel function as per interval history Genitourinary: denies burning with urination or urinary frequency Musculoskeletal: denies pain, decreased motor or sensation Integumentary: denies any other rashes or skin discolorations except post-surgical abdominal wounds Neurological: denies HA or vision/hearing changes   Vital signs in last 24 hours: [min-max] current  Temp:  [98 F (36.7 C)-99 F (37.2 C)] 98 F (36.7 C) (09/17 0424) Pulse Rate:  [83-90] 90 (09/17 0424) Resp:  [16-18] 18 (09/17 0424) BP: (137-165)/(54-69) 137/56 (09/17 0424) SpO2:  [97 %-99 %] 97 % (09/17 0424) Weight:  [106 lb (48.1 kg)-107 lb 12.8 oz (48.9 kg)] 107 lb 12.8 oz (48.9 kg) (09/17 0500)     Height: 5\' 4"  (162.6 cm) Weight: 107 lb 12.8 oz (48.9 kg) BMI (Calculated): 18.49   Intake/Output this shift:  No intake/output data recorded.   Intake/Output last 2 shifts:  @IOLAST2SHIFTS @   Physical Exam:  Constitutional: alert, cooperative and no distress  HENT: normocephalic without obvious abnormality  Eyes: PERRL, EOM's grossly intact and symmetric  Neuro: CN II - XII grossly intact and symmetric without deficit  Respiratory: breathing non-labored at rest  Cardiovascular: regular rate and sinus rhythm  Gastrointestinal: soft, completely non-tender and non-distended, midline surgical incision well-approximated without erythema or drainage, multiple drains and gastro-jejunal  decompression/feeding tube well-secured, tube feeds ongoing Musculoskeletal: UE and LE FROM, no edema or wounds, motor and sensation grossly intact, NT   Labs:  CBC Latest Ref Rng & Units 09/27/2016 09/26/2016 09/25/2016  WBC 3.6 - 11.0 K/uL 14.8(H) 14.9(H) 17.2(H)  Hemoglobin 12.0 - 16.0 g/dL 9.9(L) 9.5(L) 9.7(L)  Hematocrit 35.0 - 47.0 % 29.4(L) 27.9(L) 28.5(L)  Platelets 150 - 440 K/uL 940(HH) 776(H) 685(H)   CMP Latest Ref Rng & Units 09/27/2016 09/26/2016 09/25/2016  Glucose 65 - 99 mg/dL 125(H) 190(H) 183(H)  BUN 6 - 20 mg/dL 10 10 12   Creatinine 0.44 - 1.00 mg/dL 0.85 0.75 0.70  Sodium 135 - 145 mmol/L 134(L) 135 137  Potassium 3.5 - 5.1 mmol/L 4.3 4.6 4.2  Chloride 101 - 111 mmol/L 99(L) 100(L) 101  CO2 22 - 32 mmol/L 29 29 29   Calcium 8.9 - 10.3 mg/dL 7.5(L) 7.3(L) 7.2(L)  Total Protein 6.5 - 8.1 g/dL 5.6(L) - -  Total Bilirubin 0.3 - 1.2 mg/dL 0.4 - -  Alkaline Phos 38 - 126 U/L 68 - -  AST 15 - 41 U/L 16 - -  ALT 14 - 54 U/L 10(L) - -   Imaging studies: No new pertinent imaging studies   Assessment/Plan: (ICD-10's: K14.5) 83 y.o. female with controlled duodenal fistula 11 Days Post-Op s/p distal gastrectomy, oversewing/closure of duodenal stump with omental patch, and placement of multiple peritoneal drains and retrograde duodenal drain with gastro-jejunal feeding tube for perforation of acute on chronic duodenal ulcer with marked surrounding inflammation and scar tissue, but no evidence of malignancy on surgical pathology, complicated by pertinent comorbidities including advanced age, severe malnutrition, Alzheimer's dementia, HLD, CAD s/p MI, GERD with gastritis, fibrocystic Left breast disease, lumbar spinal stenosis,  osteoarthritis, and a history of post-partum LLE DVT.   - NPO except few ice chips  - continue jejunal tube feeds and octreotide  - check am WBC and trend, consider repeat CT if not decreasing  - antibiotics, including Flagyl and enteric vancomycin with jejunal  tube feeds   - monitor and trend/record bilious drainage from duodenal drain and Right JP drain in particular  - out of bed to chair, frequent repositioning to reduce risk of pressure wounds  - medical management of co-morbidities  - discharge planning to LTAC  - DVT prophylaxis  All of the above findings and recommendations were discussed at length with the patient, patient's long-time friend and family, and patient's RN and case manager, and all of patient's and family's questions were answered to their expressed satisfaction.  -- Marilynne Drivers Rosana Hoes, MD, [place]: [facility] - Partnering for exceptional care. Office: [phone]

## 2016-09-27 NOTE — Progress Notes (Signed)
Critical Platelet count of 940 called to Dr. Adonis Huguenin; Acknowledged; Barbaraann Faster, RN 5:49 AM 09/27/2016

## 2016-09-27 NOTE — Progress Notes (Signed)
Physical Therapy Treatment Patient Details Name: Misty Lucas MRN: [id number] DOB: [birth year] Today's Date: 09/27/2016    History of Present Illness 83 y.o. female who presents with abdominal pain.  She has a history of Alzheimer's Dementia. She has been dealing with constipation issues for a long time and has been having more recently upper abdominal pain for the past week.  She had been recently seen in the emergency room and was noted to be constipated with fecal impaction. She was disimpacted and her pain at that point had subsided but now has worsened again. She was initially admitted to the medical team today with concerns for possible UTI which is positive on her urinalysis. There was a concern for possible EKG changes compared to the EKG from last week. However CT scan of the abdomen was done which showed pneumoperitoneum with possible perforated peptic ulcer. Pt underwent emergency surgery and is now POD#2 s/p exploratory laparotomy, distal gastrectomy, gastrojejunostomy, duodenal stump closure with omental patch, duodenal drainage tube placement, and moss feeding tube placement. Pt has been more confused than her baseline and is currently with a sitter. Of note pt suffered a L hip fracture in August 2018 and underwent anterior approach [facility]miarthroplasty at [facility]. She was discharged to [facility]. During prior admission she was WBAT with anterior precautions.    PT Comments    Ms. Hilbun was agreeable to attempt supine>sit with this therapist; however, once pt sitting EOB she becomes upset and demands to return to supine.  Following bed mobility pt appears upset and refuses to participate with therapy any further.  She currently requires max assist for bed mobility and is limited by weakness and likely pain.  SNF remains appropriate d/c plan at this time.    Follow Up Recommendations  SNF     Equipment Recommendations  None recommended by PT    Recommendations for Other Services        Precautions / Restrictions Precautions Precautions: Fall;Anterior Hip Precaution Booklet Issued: No Restrictions Weight Bearing Restrictions: Yes LLE Weight Bearing: Weight bearing as tolerated    Mobility  Bed Mobility Overal bed mobility: Needs Assistance Bed Mobility: Supine to Sit;Sit to Supine     Supine to sit: Max assist;HOB elevated Sit to supine: Max assist   General bed mobility comments: Pt agreeable to attempt supine>sit with this therapist.  She requires max assist to bring BLEs to EOB and to elevate trunk and becomes upset once sitting EOB and demanding to return to supine.  Suspect this is due to pain.   Transfers                 General transfer comment: Pt refuses  Ambulation/Gait                 Stairs            Wheelchair Mobility    Modified Rankin (Stroke Patients Only)       Balance Overall balance assessment: Needs assistance Sitting-balance support: Bilateral upper extremity supported;Feet unsupported Sitting balance-Leahy Scale: Zero Sitting balance - Comments: Pt refusing to remain sitting EOB after assisted with supine>sit and begins progressing back to supine immediately upon sitting.  Postural control: Posterior lean                                  Cognition Arousal/Alertness: Awake/alert Behavior During Therapy: WFL for tasks assessed/performed;Agitated Overall Cognitive Status: History of cognitive impairments -  at baseline                                        Exercises General Exercises - Lower Extremity Ankle Circles/Pumps: PROM;Both;10 reps;Supine Heel Slides: Strengthening;10 reps;Both;Supine;AAROM Straight Leg Raises: AAROM;Both;10 reps;Supine;Strengthening;AROM    General Comments General comments (skin integrity, edema, etc.): Noted that JP drain looks full and RN notified.       Pertinent Vitals/Pain Pain Assessment: Faces Faces Pain Scale: Hurts little  more Pain Location: c/o abdominal pain and L hip pain Pain Descriptors / Indicators: Discomfort;Grimacing Pain Intervention(s): Limited activity within patient's tolerance;Monitored during session;Repositioned    Home Living                      Prior Function            PT Goals (current goals can now be found in the care plan section) Acute Rehab PT Goals Patient Stated Goal: none stated Progress towards PT goals: Progressing toward goals (very modestly)    Frequency    Min 2X/week      PT Plan Current plan remains appropriate    Co-evaluation              AM-PAC PT "6 Clicks" Daily Activity  Outcome Measure  Difficulty turning over in bed (including adjusting bedclothes, sheets and blankets)?: Unable Difficulty moving from lying on back to sitting on the side of the bed? : Unable Difficulty sitting down on and standing up from a chair with arms (e.g., wheelchair, bedside commode, etc,.)?: Unable Help needed moving to and from a bed to chair (including a wheelchair)?: Total Help needed walking in hospital room?: Total Help needed climbing 3-5 steps with a railing? : Total 6 Click Score: 6    End of Session   Activity Tolerance: Patient limited by pain;Patient limited by fatigue;Treatment limited secondary to agitation Patient left: with call bell/phone within reach;with bed alarm set;in bed;with family/visitor present;Other (comment) (with BLEs elevated on pillow) Nurse Communication: Mobility status;Other (comment) (JP drain appears to be full) PT Visit Diagnosis: Unsteadiness on feet (R26.81);Muscle weakness (generalized) (M62.81);History of falling (Z91.81);Pain;Difficulty in walking, not elsewhere classified (R26.2) Pain - Right/Left: Left Pain - part of body: Hip (also with abdominal pain)     Time: 3875-6433 PT Time Calculation (min) (ACUTE ONLY): 14 min  Charges:  $Therapeutic Exercise: 8-22 mins                    G Codes:        Collie Siad PT, DPT 09/27/2016, 3:14 PM

## 2016-09-27 NOTE — Care Management (Signed)
Notified by MD that patient would require octreotide drip at discharge.  RNCM notified MD that due to drip, SNF would not be able to accept patient at discharge and LTACH level of care would need to be pursued.  MD agreed and wishes to proceed with obtaining auth 09/28/16.  RNCM spoke with son [name].  I have updated Richard, and he is in agreement to pursue LTACH.  Patient would not qualify for [facility] as she has not had a require 3 night ICU stay.  At this time the only local LTACH to pursue would be [facility].  Richard is in agreement.  Richard ask that I also update the patient on this decision.  At this time she is resting soundly, and still has a Oncologist in place.  RNCM to follow up with patient tomorrow, and reach out to [facility] representative.

## 2016-09-27 NOTE — Progress Notes (Signed)
[facility] [place] NAME: Misty Lucas    MR#:  [id number]  DATE OF BIRTH:  August 22, 1924  Very sleepy today morning. No fever. Patient denies abdominal pain. No shortness of breath.   CHIEF COMPLAINT:   Chief Complaint  Patient presents with  . Abdominal Pain  . Constipation    REVIEW OF SYSTEMS:   Review of Systems  Constitutional: Negative for chills, fever and malaise/fatigue.  HENT: Negative for hearing loss, nosebleeds and tinnitus.   Eyes: Negative for blurred vision, double vision and photophobia.  Respiratory: Negative for cough, hemoptysis and shortness of breath.   Cardiovascular: Negative for chest pain, palpitations, orthopnea and leg swelling.  Gastrointestinal: Negative for abdominal pain, diarrhea, nausea and vomiting.  Genitourinary: Negative for dysuria and urgency.  Musculoskeletal: Negative for myalgias and neck pain.  Skin: Negative for itching and rash.  Neurological: Negative for dizziness, tingling, focal weakness, seizures, weakness and headaches.  Psychiatric/Behavioral: Negative for memory loss. The patient does not have insomnia.    confuse DRUG ALLERGIES:   Allergies  Allergen Reactions  . Carafate [Sucralfate] Other (See Comments)    Unknown, pt does not remember  . Lipitor [Atorvastatin] Other (See Comments)    Unknown, pt can't remember   . Mobic [Meloxicam] Other (See Comments)    Pt does not remember  . Penicillins Other (See Comments)    Pt does not remember  . Tramadol Other (See Comments)    Pt does not remember    VITALS:  Blood pressure (!) 137/56, pulse 90, temperature 98 F (36.7 C), resp. rate 18, height 5\' 4"  (1.626 m), weight 48.9 kg (107 lb 12.8 oz), SpO2 97 %.  PHYSICAL EXAMINATION:  GENERAL:  83 y.o.-year-old patient lying in the bed with no acute distress. Sleeping but able to answer questions appropriately.  EYES: Pupils equal, round, reactive to light  . No scleral  icterus.  HEENT: Head atraumatic, normocephalic. Oropharynx and nasopharynx clear.  NECK:  Supple, no jugular venous distention. No thyroid enlargement, no tenderness.  LUNGS: Normal breath sounds bilaterally, no wheezing, rales,rhonchi or crepitation. No use of accessory muscles of respiration.  CARDIOVASCULAR: S1, S2 normal. No murmurs, rubs, or gallops.  ABDOMEN;midline incision is clean under Honeycomb dressing. Abdominal binder is present Right-sided JP drain has blood-tinged fluid. Left JP   drain  Is serosanguineous.duodenal  Drain is bilious. EXTREMITIES: No pedal edema, cyanosis, or clubbing.  NEUROLOGIC: Cranial nerves II through XII are intact. Muscle strength 3/5 in all extremities. Sensation intact. Gait not checked.  PSYCHIATRIC: The patient is alert and oriented x 0.  SKIN: No obvious rash, lesion, or ulcer.    LABORATORY PANEL:   CBC  Recent Labs Lab 09/27/16 0330  WBC 14.8*  HGB 9.9*  HCT 29.4*  PLT 940*   ------------------------------------------------------------------------------------------------------------------  Chemistries   Recent Labs Lab 09/27/16 0330  NA 134*  K 4.3  CL 99*  CO2 29  GLUCOSE 125*  BUN 10  CREATININE 0.85  CALCIUM 7.5*  MG 1.9  AST 16  ALT 10*  ALKPHOS 68  BILITOT 0.4   ------------------------------------------------------------------------------------------------------------------  Cardiac Enzymes No results for input(s): TROPONINI in the last 168 hours. ------------------------------------------------------------------------------------------------------------------  RADIOLOGY:  No results found.  EKG:   Orders placed or performed during the hospital encounter of 09/16/16  . EKG 12-Lead  . EKG 12-Lead    ASSESSMENT AND PLAN:   # Acute abdominal pain  secondary to bowel perforation status post emergency surgery  for duodenal perforation, patient had gastrectomy with gastrojejunostomy with the  duodenaldrain. Patient has baseline  dementia.  Continue jejunal feeds, tolerating them well. Management for surgery  Pathology result confirmed no cancer.  # sepsis secondary to #1. Continue Zosyn , vanc , flagyl # c diff colitis   Vanc via feeding tube is started. Already on Flagyl also. Leukocytosis is improving patient has thrombocytosis now. # hypo-phosphetemia/hypokalemia   Replete, pharmacy consulted for electrolite management as needed basis  # elevated troponin secondary to #1.  # Alzheimer's dementia.; Currently patient is alert and communicating. Palliative care consulted- plan was to sent to hospice, but as no malignancy on report, surgery suggested to wait for 1-2 days for progress. Physical therapy recommended skilled nursing, family [facility].. Thrombocytosis; likely due to sepsis. Recommend hematology evaluation.   CODE STATUS is DNR  Poor prognosis    All the records are reviewed and case discussed with Care Management/Social Worker.  CODE STATUS: DNR TOTAL TIME TAKING CARE OF THIS PATIENT: 35 minutes.   POSSIBLE D/C IN 1-2 DAYS, DEPENDING ON CLINICAL CONDITION. Prognosis is very poor.  Epifanio Lesches M.D on 09/27/2016 at 9:25 AM  Between 7am to 6pm - Pager - [phone].  After 6pm go to [email or web address] - password [email or web address] [facility]  Office  [phone]  CC: Primary care physician; Rusty Aus, MD   Note: This dictation was prepared with Dragon dictation along with smaller phrase technology. Any transcriptional errors that result from this process are unintentional.

## 2016-09-28 LAB — GLUCOSE, CAPILLARY
GLUCOSE-CAPILLARY: 163 mg/dL — AB (ref 65–99)
GLUCOSE-CAPILLARY: 135 mg/dL — AB (ref 65–99)
GLUCOSE-CAPILLARY: 146 mg/dL — AB (ref 65–99)
Glucose-Capillary: 169 mg/dL — ABNORMAL HIGH (ref 65–99)
Glucose-Capillary: 184 mg/dL — ABNORMAL HIGH (ref 65–99)
Glucose-Capillary: 184 mg/dL — ABNORMAL HIGH (ref 65–99)

## 2016-09-28 LAB — BASIC METABOLIC PANEL
Anion gap: 5 (ref 5–15)
BUN: 12 mg/dL (ref 6–20)
CHLORIDE: 100 mmol/L — AB (ref 101–111)
CO2: 30 mmol/L (ref 22–32)
CREATININE: 0.91 mg/dL (ref 0.44–1.00)
Calcium: 7.3 mg/dL — ABNORMAL LOW (ref 8.9–10.3)
GFR calc Af Amer: >60 mL/min (ref >60)
GFR, EST NON AFRICAN AMERICAN: 54 mL/min — AB (ref >60)
GLUCOSE: 176 mg/dL — AB (ref 65–99)
Potassium: 4.4 mmol/L (ref 3.5–5.1)
SODIUM: 135 mmol/L (ref 135–145)

## 2016-09-28 LAB — PHOSPHORUS: Phosphorus: 3 mg/dL (ref 2.5–4.6)

## 2016-09-28 NOTE — Progress Notes (Signed)
CH responded to an OR for End of life. Pt presented angry. [place] asked why she is upset. Pt stated that she did not deserve to be in bed.,. that she is not sick. [place] stated that the nurses informed him she had some major surgery and needs to be in bed. Pt stated that she does not wish to be a burden on her family. Pt did not wish to delve deeper into the subject at this time, but stated she wanted to go ahead and pass. CH offered prayer for peace and healing, which was appreciated. [place] will follow up later in rounding.    09/28/16 1200  Clinical Encounter Type  Visited With Patient;Health care provider  Visit Type Initial;Spiritual support  Referral From Nurse  Consult/Referral To Chaplain  Spiritual Encounters  Spiritual Needs Prayer;Emotional

## 2016-09-28 NOTE — Progress Notes (Addendum)
[place] [facility] Day(s): 12.   Post op day(s): 12 Days Post-Op.   Interval History: Patient seen and examined, no acute events or new complaints overnight. Patient reports ongoing +flatus and +BM, denies abdominal pain, N/V, fever/chills, CP, or SOB, has been tolerating jejunal tube feeds with slowly decreasing bilious drainage.  Review of Systems:  Constitutional: denies fever, chills  HEENT: denies cough or congestion  Respiratory: denies any shortness of breath  Cardiovascular: denies chest pain or palpitations  Gastrointestinal: abdominal pain, N/V, and bowel function as per interval history Genitourinary: denies burning with urination or urinary frequency Musculoskeletal: denies pain, decreased motor or sensation Integumentary: denies any other rashes or skin discolorations except post-surgical abdominal wounds Neurological: denies HA or vision/hearing changes   Vital signs in last 24 hours: [min-max] current  Temp:  [97.3 F (36.3 C)-98.6 F (37 C)] 98.6 F (37 C) (09/18 0536) Pulse Rate:  [82-93] 82 (09/18 0536) Resp:  [20-24] 24 (09/18 0536) BP: (132-152)/(49-61) 132/49 (09/18 0536) SpO2:  [96 %-98 %] 96 % (09/18 0536) Weight:  [103 lb 9.6 oz (47 kg)] 103 lb 9.6 oz (47 kg) (09/18 0300)     Height: 5\' 4"  (162.6 cm) Weight: 103 lb 9.6 oz (47 kg) BMI (Calculated): 17.77   Intake/Output this shift:  No intake/output data recorded.   Intake/Output last 2 shifts:  @IOLAST2SHIFTS @   Physical Exam:  Constitutional: alert, cooperative and no distress  HENT: normocephalic without obvious abnormality  Eyes: PERRL, EOM's grossly intact and symmetric  Neuro: CN II - XII grossly intact and symmetric without deficit  Respiratory: breathing non-labored at rest  Cardiovascular: regular rate and sinus rhythm  Gastrointestinal: soft, completely non-tender and non-distended, midline surgical incision well-approximated without erythema or drainage, multiple drains  and gastro-jejunal decompression/feeding tube well-secured, tube feeds ongoing Musculoskeletal: UE and LE FROM, no edema or wounds, motor and sensation grossly intact, NT   Labs:  CBC Latest Ref Rng & Units 09/27/2016 09/26/2016 09/25/2016  WBC 3.6 - 11.0 K/uL 14.8(H) 14.9(H) 17.2(H)  Hemoglobin 12.0 - 16.0 g/dL 9.9(L) 9.5(L) 9.7(L)  Hematocrit 35.0 - 47.0 % 29.4(L) 27.9(L) 28.5(L)  Platelets 150 - 440 K/uL 940(HH) 776(H) 685(H)   CMP Latest Ref Rng & Units 09/28/2016 09/27/2016 09/26/2016  Glucose 65 - 99 mg/dL 176(H) 125(H) 190(H)  BUN 6 - 20 mg/dL 12 10 10   Creatinine 0.44 - 1.00 mg/dL 0.91 0.85 0.75  Sodium 135 - 145 mmol/L 135 134(L) 135  Potassium 3.5 - 5.1 mmol/L 4.4 4.3 4.6  Chloride 101 - 111 mmol/L 100(L) 99(L) 100(L)  CO2 22 - 32 mmol/L 30 29 29   Calcium 8.9 - 10.3 mg/dL 7.3(L) 7.5(L) 7.3(L)  Total Protein 6.5 - 8.1 g/dL - 5.6(L) -  Total Bilirubin 0.3 - 1.2 mg/dL - 0.4 -  Alkaline Phos 38 - 126 U/L - 68 -  AST 15 - 41 U/L - 16 -  ALT 14 - 54 U/L - 10(L) -   Imaging studies: No new pertinent imaging studies   Assessment/Plan: (ICD-10's: K26.5) 83 y.o. female with controlled duodenal fistula 12 Days Post-Op s/p distal gastrectomy, oversewing/closure of duodenal stump with omental patch, and placement of multiple peritoneal drains and retrograde duodenal drain with gastro-jejunal feeding tube for perforation of acute on chronic duodenal ulcer with marked surrounding inflammation and scar tissue, but no evidence of malignancy on surgical pathology, complicated by pertinent comorbidities including advanced age, severe malnutrition, Alzheimer's dementia, HLD, CAD s/p MI, GERD with gastritis, fibrocystic Left breast disease, lumbar  spinal stenosis, osteoarthritis, and a history of post-partum LLE DVT.              - NPO except few ice chips  - abdominal binder to protect surgically placed drains             - repeat am WBC and trend, consider repeat CT if not decreasing              - antibiotics, including Flagyl and enteric vancomycin with jejunal tube feeds              - monitor and trend/record bilious drainage from duodenal drain and Right JP drain in particular             - continue jejunal tube feeds and octreotide for indefinite duration, possibly until duodenal stump heals             - discharge planning to LTAC, though family expresses difficulty with determining goals of care             - out of bed to chair, frequent repositioning to reduce risk of pressure wounds             - medical management of co-morbidities             - DVT prophylaxis  All of the above findings and recommendations were discussed at length with the patient, patient's long-time friend and family, and patient's RN and case manager, and all of patient's and family's questions were answered to their expressed satisfaction.  -- Marilynne Drivers Rosana Hoes, MD, [place]: [facility] - Partnering for exceptional care. Office: [phone]

## 2016-09-28 NOTE — Progress Notes (Signed)
Patient made the statement that..." I'm not going to get better am I?"  She also asked if she was going to get better; voiced concern that she was a "..burden to my family.Marland KitchenMarland KitchenMarland KitchenI should go ahead and pass away..."; encouraged her to voice her fears and encouraged her to speak with her family about her concerns; Given emotional support; asked her if she felt like her family was a burden when she was taking care of them, she stated "No"; told her that now it was her turn to be taken care of by her family. Patient requested a chaplain come by and speak/pray with her; Spiritual consult ordered; Barbaraann Faster, RN 6:59 AM 09/28/2016

## 2016-09-28 NOTE — Progress Notes (Addendum)
[facility] [place] NAME: Misty Lucas    MR#:  [id number]  DATE OF BIRTH:  1924-12-06  Has no shortness of breath. No fever. Still has copious amount of drainage from abdominal drains. Nothing by mouth now, tolerating the tube feeding.   CHIEF COMPLAINT:   Chief Complaint  Patient presents with  . Abdominal Pain  . Constipation    REVIEW OF SYSTEMS:   Review of Systems  Constitutional: Negative for chills, fever and malaise/fatigue.  HENT: Negative for hearing loss, nosebleeds and tinnitus.   Eyes: Negative for blurred vision, double vision and photophobia.  Respiratory: Negative for cough, hemoptysis and shortness of breath.   Cardiovascular: Negative for chest pain, palpitations, orthopnea and leg swelling.  Gastrointestinal: Negative for abdominal pain, diarrhea, nausea and vomiting.  Genitourinary: Negative for dysuria and urgency.  Musculoskeletal: Negative for myalgias and neck pain.  Skin: Negative for itching and rash.  Neurological: Negative for dizziness, tingling, focal weakness, seizures, weakness and headaches.  Psychiatric/Behavioral: Negative for memory loss. The patient does not have insomnia.    confuse DRUG ALLERGIES:   Allergies  Allergen Reactions  . Carafate [Sucralfate] Other (See Comments)    Unknown, pt does not remember  . Lipitor [Atorvastatin] Other (See Comments)    Unknown, pt can't remember   . Mobic [Meloxicam] Other (See Comments)    Pt does not remember  . Penicillins Other (See Comments)    Pt does not remember  . Tramadol Other (See Comments)    Pt does not remember    VITALS:  Blood pressure (!) 132/49, pulse 82, temperature 98.6 F (37 C), temperature source Oral, resp. rate (!) 24, height 5\' 4"  (1.626 m), weight 47 kg (103 lb 9.6 oz), SpO2 96 %.  PHYSICAL EXAMINATION:  GENERAL:  83 y.o.-year-old patient lying in the bed with no acute distress.overall all appears very  weak. EYES: Pupils equal, round, reactive to light  . No scleral icterus.  HEENT: Head atraumatic, normocephalic. Oropharynx and nasopharynx clear.  NECK:  Supple, no jugular venous distention. No thyroid enlargement, no tenderness.  LUNGS: Normal breath sounds bilaterally, no wheezing, rales,rhonchi or crepitation. No use of accessory muscles of respiration.  CARDIOVASCULAR: S1, S2 normal. No murmurs, rubs, or gallops.  ABDOMEN;midline incision is clean under Honeycomb dressing. Abdominal binder is present Right-sided JP drain has blood-tinged fluid. Left JP   drain  Is serosanguineous.duodenal  Drain is bilious. Midline also is draining right now. EXTREMITIES: No pedal edema, cyanosis, or clubbing.  NEUROLOGIC: Cranial nerves II through XII are intact. Muscle strength 3/5 in all extremities. Sensation intact. Gait not checked.  PSYCHIATRIC: The patient is alert and oriented x 0.  SKIN: No obvious rash, lesion, or ulcer.    LABORATORY PANEL:   CBC  Recent Labs Lab 09/27/16 0330  WBC 14.8*  HGB 9.9*  HCT 29.4*  PLT 940*   ------------------------------------------------------------------------------------------------------------------  Chemistries   Recent Labs Lab 09/27/16 0330 09/28/16 0531  NA 134* 135  K 4.3 4.4  CL 99* 100*  CO2 29 30  GLUCOSE 125* 176*  BUN 10 12  CREATININE 0.85 0.91  CALCIUM 7.5* 7.3*  MG 1.9  --   AST 16  --   ALT 10*  --   ALKPHOS 68  --   BILITOT 0.4  --    ------------------------------------------------------------------------------------------------------------------  Cardiac Enzymes No results for input(s): TROPONINI in the last 168 hours. ------------------------------------------------------------------------------------------------------------------  RADIOLOGY:  No results found.  EKG:  Orders placed or performed during the hospital encounter of 09/16/16  . EKG 12-Lead  . EKG 12-Lead    ASSESSMENT AND PLAN:   # Acute  abdominal pain  secondary to bowel perforation status post emergency surgery for duodenal perforation, patient had gastrectomy with gastrojejunostomy with the duodenaldrain. Patient has baseline  dementia.  Continue jejunal feeds, tolerating them well. Management for surgery . Octreotide drip, possible transfer to [facility] as per case manager note. Pathology result confirmed no cancer.  # sepsis secondary to #1. Continue Zosyn , vanc , flagyl # c diff colitis   Vanc via feeding tube is started. Already on Flagyl also. Leukocytosis is improving patient has thrombocytosis now. # hypo-phosphetemia/hypokalemia   Replete, pharmacy consulted for electrolite management as needed basis  # elevated troponin secondary to #1.  # Alzheimer's dementia.; Currently patient is alert and communicating. Palliative care consulted- plan was to sent to hospice, but as no malignancy on report, management as per surgery.  Physical therapy recommended skilled nursing, family [facility]. CODE STATUS is DNR  Poor prognosis    All the records are reviewed and case discussed with Care Management/Social Worker.  CODE STATUS: DNR TOTAL TIME TAKING CARE OF THIS PATIENT: 35 minutes.   POSSIBLE D/C IN 1-2 DAYS, DEPENDING ON CLINICAL CONDITION. Prognosis is very poor.  Epifanio Lesches M.D on 09/28/2016 at 10:01 AM  Between 7am to 6pm - Pager - [phone].  After 6pm go to [email or web address] - password [email or web address] [facility]  Office  [phone]  CC: Primary care physician; Rusty Aus, MD   Note: This dictation was prepared with Dragon dictation along with smaller phrase technology. Any transcriptional errors that result from this process are unintentional.

## 2016-09-28 NOTE — Care Management (Signed)
MD has agreed to proceed with LTACH today.  Loury from [facility] notified of referral.

## 2016-09-28 NOTE — Care Management Important Message (Signed)
Important Message  Patient Details  Name: Misty Lucas MRN: [id number] Date of Birth: 1924/09/19   Medicare Important Message Given:  Yes    Beverly Sessions, RN 09/28/2016, 11:43 AM

## 2016-09-28 NOTE — Progress Notes (Signed)
No charge note.  Spoke with [name].  Plan is for Mrs. Haag to go to [facility].  Family has strong concerns about how she will fare.  No plans for hospice at this point.   Recommend Palliative care follow at [facility] or where ever she goes in an attempt to support the patient and family.  Palliative Medicine Team will sign off for now.  Please call us back if needed.   Thank you for allowing [place] to be involved in the care of this lovely patient and family.   [name], PA-C Palliative Medicine Pager: [phone]

## 2016-09-28 NOTE — Progress Notes (Signed)
PT Cancellation Note  Patient Details Name: CHARLIENE INOUE MRN: [id number] DOB: 09-11-1924   Cancelled Treatment:    Reason Eval/Treat Not Completed: Patient declined, no reason specified;Other (comment). Treatment attempted; pt refused. Per chart review, pt is going to LTAC post acute care stay and palliative services have been consulted. PT order active and will re attempt at a later date, as the schedule allows.    Larae Grooms, PTA 09/28/2016, 2:52 PM

## 2016-09-28 NOTE — Consult Note (Signed)
MEDICATION RELATED CONSULT NOTE -FOLLOW UP  Pharmacy Consult for electrolytes Indication: low phosphorus (refeeding)  Allergies  Allergen Reactions  . Carafate [Sucralfate] Other (See Comments)    Unknown, pt does not remember  . Lipitor [Atorvastatin] Other (See Comments)    Unknown, pt can't remember   . Mobic [Meloxicam] Other (See Comments)    Pt does not remember  . Penicillins Other (See Comments)    Pt does not remember  . Tramadol Other (See Comments)    Pt does not remember    Patient Measurements: Height: 5\' 4"  (162.6 cm) Weight: 103 lb 9.6 oz (47 kg) IBW/kg (Calculated) : 54.7 Adjusted Body Weight:   Vital Signs: Temp: 98.6 F (37 C) (09/18 0536) Temp Source: Oral (09/18 0536) BP: 132/49 (09/18 0536) Pulse Rate: 82 (09/18 0536) Intake/Output from previous day: 09/17 0701 - 09/18 0700 In: 1507 [NG/GT:1191; IV Piggyback:226] Out: 710 [Drains:710] Intake/Output from this shift: No intake/output data recorded.  Labs:  Recent Labs  09/26/16 0413 09/27/16 0330 09/28/16 0531  WBC 14.9* 14.8*  --   HGB 9.5* 9.9*  --   HCT 27.9* 29.4*  --   PLT 776* 940*  --   APTT  --  37*  --   CREATININE 0.75 0.85 0.91  MG  --  1.9  --   PHOS 3.1 3.0 3.0  ALBUMIN  --  1.5*  --   PROT  --  5.6*  --   AST  --  16  --   ALT  --  10*  --   ALKPHOS  --  68  --   BILITOT  --  0.4  --    Estimated Creatinine Clearance: 30.5 mL/min (by C-G formula based on SCr of 0.91 mg/dL).   Microbiology: Recent Results (from the past 720 hour(s))  Urine Culture     Status: Abnormal   Collection Time: 09/16/16 10:23 AM  Result Value Ref Range Status   Specimen Description URINE, RANDOM  Final   Special Requests NONE  Final   Culture >=100,000 COLONIES/mL ESCHERICHIA COLI (A)  Final   Report Status 09/18/2016 FINAL  Final   Organism ID, Bacteria ESCHERICHIA COLI (A)  Final      Susceptibility   Escherichia coli - MIC*    AMPICILLIN >=32 RESISTANT Resistant     CEFAZOLIN 16  SENSITIVE Sensitive     CEFTRIAXONE <=1 SENSITIVE Sensitive     CIPROFLOXACIN <=0.25 SENSITIVE Sensitive     GENTAMICIN <=1 SENSITIVE Sensitive     IMIPENEM <=0.25 SENSITIVE Sensitive     NITROFURANTOIN <=16 SENSITIVE Sensitive     TRIMETH/SULFA <=20 SENSITIVE Sensitive     AMPICILLIN/SULBACTAM >=32 RESISTANT Resistant     PIP/TAZO <=4 SENSITIVE Sensitive     Extended ESBL NEGATIVE Sensitive     * >=100,000 COLONIES/mL ESCHERICHIA COLI  MRSA PCR Screening     Status: None   Collection Time: 09/16/16  7:56 PM  Result Value Ref Range Status   MRSA by PCR NEGATIVE NEGATIVE Final    Comment:        The GeneXpert MRSA Assay (FDA approved for NASAL specimens only), is one component of a comprehensive MRSA colonization surveillance program. It is not intended to diagnose MRSA infection nor to guide or monitor treatment for MRSA infections.   C difficile quick scan w PCR reflex     Status: Abnormal   Collection Time: 09/22/16  5:58 PM  Result Value Ref Range Status   C Diff antigen  POSITIVE (A) NEGATIVE Final   C Diff toxin NEGATIVE NEGATIVE Final   C Diff interpretation Results are indeterminate. See PCR results.  Final  Clostridium Difficile by PCR     Status: Abnormal   Collection Time: 09/22/16  5:58 PM  Result Value Ref Range Status   Toxigenic C Difficile by pcr POSITIVE (A) NEGATIVE Final    Comment: Positive for toxigenic C. difficile with little to no toxin production. Only treat if clinical presentation suggests symptomatic illness.    Medical History: Past Medical History:  Diagnosis Date  . Arthritis   . Blood clot embolism during pregnancy, antepartum 1957   left thigh  . Fibrocystic breast    left breast  . Gastritis   . History of arthroscopy of knee 1980's   right  . Hyperlipemia   . Lumbar stenosis    2 buldging disc  . Myocardial infarction ([place]) 2008    Medications:  Scheduled:  . enoxaparin (LOVENOX) injection  40 mg Subcutaneous Q24H  . free  water  30 mL Per Tube Q6H  . insulin aspart  0-9 Units Subcutaneous Q4H  . octreotide  50 mcg Subcutaneous Q8H  . pantoprazole (PROTONIX) IV  40 mg Intravenous Q12H  . vancomycin  125 mg Per Tube Q6H    Assessment: Patient is a 83 year old female with a bowl perforation s/p gastrectomy w/ GJ tube. Pt started on tube feeds. Pt has severe malnutrition and is at risk for refeeding syndrome. Pharmacy consulted to manage electrolytes.  All labs within normal limits.   Goal of Therapy:  Normalization of electrolytes  Plan: No supplementation warranted @ this time. Recheck labs in 2 days.   9/18 0531 K 4.4, Ca 7.3, albumun 1.5, adjusted Ca 9.3, Phos 3, Mg not assessed. No further supplement warranted at this time. Will recheck labs in 2 days.  Jr Milliron A. Jordan Hawks, PharmD, BCPS Clinical Pharmacist 09/28/2016 7:17 AM

## 2016-09-29 DIAGNOSIS — Z9889 Other specified postprocedural states: Secondary | ICD-10-CM

## 2016-09-29 DIAGNOSIS — K3189 Other diseases of stomach and duodenum: Secondary | ICD-10-CM

## 2016-09-29 DIAGNOSIS — M1990 Unspecified osteoarthritis, unspecified site: Secondary | ICD-10-CM

## 2016-09-29 DIAGNOSIS — E785 Hyperlipidemia, unspecified: Secondary | ICD-10-CM

## 2016-09-29 DIAGNOSIS — Z87891 Personal history of nicotine dependence: Secondary | ICD-10-CM

## 2016-09-29 DIAGNOSIS — I252 Old myocardial infarction: Secondary | ICD-10-CM

## 2016-09-29 DIAGNOSIS — K265 Chronic or unspecified duodenal ulcer with perforation: Secondary | ICD-10-CM

## 2016-09-29 DIAGNOSIS — K316 Fistula of stomach and duodenum: Secondary | ICD-10-CM

## 2016-09-29 DIAGNOSIS — D649 Anemia, unspecified: Secondary | ICD-10-CM

## 2016-09-29 DIAGNOSIS — F0390 Unspecified dementia without behavioral disturbance: Secondary | ICD-10-CM

## 2016-09-29 DIAGNOSIS — Z79899 Other long term (current) drug therapy: Secondary | ICD-10-CM

## 2016-09-29 DIAGNOSIS — D473 Essential (hemorrhagic) thrombocythemia: Secondary | ICD-10-CM

## 2016-09-29 LAB — COMPREHENSIVE METABOLIC PANEL
ALBUMIN: 1.3 g/dL — AB (ref 3.5–5.0)
ALT: 8 U/L — ABNORMAL LOW (ref 14–54)
ANION GAP: 4 — AB (ref 5–15)
AST: 16 U/L (ref 15–41)
Alkaline Phosphatase: 60 U/L (ref 38–126)
BUN: 12 mg/dL (ref 6–20)
CALCIUM: 7.4 mg/dL — AB (ref 8.9–10.3)
CO2: 29 mmol/L (ref 22–32)
Chloride: 102 mmol/L (ref 101–111)
Creatinine, Ser: 0.75 mg/dL (ref 0.44–1.00)
GFR calc Af Amer: >60 mL/min (ref >60)
GFR calc non Af Amer: >60 mL/min (ref >60)
GLUCOSE: 138 mg/dL — AB (ref 65–99)
POTASSIUM: 4.3 mmol/L (ref 3.5–5.1)
Sodium: 135 mmol/L (ref 135–145)
TOTAL PROTEIN: 5.2 g/dL — AB (ref 6.5–8.1)
Total Bilirubin: 0.4 mg/dL (ref 0.3–1.2)

## 2016-09-29 LAB — GLUCOSE, CAPILLARY
GLUCOSE-CAPILLARY: 124 mg/dL — AB (ref 65–99)
GLUCOSE-CAPILLARY: 128 mg/dL — AB (ref 65–99)
GLUCOSE-CAPILLARY: 146 mg/dL — AB (ref 65–99)
GLUCOSE-CAPILLARY: 153 mg/dL — AB (ref 65–99)
GLUCOSE-CAPILLARY: 197 mg/dL — AB (ref 65–99)
GLUCOSE-CAPILLARY: 204 mg/dL — AB (ref 65–99)
Glucose-Capillary: 185 mg/dL — ABNORMAL HIGH (ref 65–99)

## 2016-09-29 LAB — CBC
HCT: 26.1 % — ABNORMAL LOW (ref 35.0–47.0)
Hemoglobin: 8.7 g/dL — ABNORMAL LOW (ref 12.0–16.0)
MCH: 28.6 pg (ref 26.0–34.0)
MCHC: 33.4 g/dL (ref 32.0–36.0)
MCV: 85.6 fL (ref 80.0–100.0)
PLATELETS: 946 10*3/uL — AB (ref 150–440)
RBC: 3.04 MIL/uL — ABNORMAL LOW (ref 3.80–5.20)
RDW: 15.2 % — AB (ref 11.5–14.5)
WBC: 12.3 10*3/uL — AB (ref 3.6–11.0)

## 2016-09-29 LAB — IRON AND TIBC: Iron: 8 ug/dL — ABNORMAL LOW (ref 28–170)

## 2016-09-29 LAB — FERRITIN: Ferritin: 257 ng/mL (ref 11–307)

## 2016-09-29 MED ORDER — DEXTROSE 5 % IV SOLN
1.0000 g | INTRAVENOUS | Status: DC
Start: 2016-09-29 — End: 2016-10-01
  Administered 2016-09-29 – 2016-09-30 (×2): 1 g via INTRAVENOUS
  Filled 2016-09-29 (×4): qty 10

## 2016-09-29 MED ORDER — OSMOLITE 1.5 CAL PO LIQD: 1000.0000 mL | ORAL | Status: DC

## 2016-09-29 MED ORDER — OSMOLITE 1.2 CAL PO LIQD
1000.0000 mL | ORAL | Status: DC
  Administered 2016-09-29 (×2): 1000 mL

## 2016-09-29 MED ORDER — ENOXAPARIN SODIUM 30 MG/0.3ML ~~LOC~~ SOLN
30.0000 mg | SUBCUTANEOUS | Status: DC
  Administered 2016-09-29 – 2016-09-30 (×2): 30 mg via SUBCUTANEOUS
  Filled 2016-09-29 (×2): qty 0.3

## 2016-09-29 NOTE — Progress Notes (Signed)
Physical Therapy Treatment Patient Details Name: Misty Lucas MRN: [id number] DOB: Jul 05, 1924 Today's Date: 09/29/2016    History of Present Illness 83 y.o. female who presents with abdominal pain.  She has a history of Alzheimer's Dementia. She has been dealing with constipation issues for a long time and has been having more recently upper abdominal pain for the past week.  She had been recently seen in the emergency room and was noted to be constipated with fecal impaction. She was disimpacted and her pain at that point had subsided but now has worsened again. She was initially admitted to the medical team today with concerns for possible UTI which is positive on her urinalysis. There was a concern for possible EKG changes compared to the EKG from last week. However CT scan of the abdomen was done which showed pneumoperitoneum with possible perforated peptic ulcer. Pt underwent emergency surgery and is now POD#2 s/p exploratory laparotomy, distal gastrectomy, gastrojejunostomy, duodenal stump closure with omental patch, duodenal drainage tube placement, and moss feeding tube placement. Pt has been more confused than her baseline and is currently with a sitter. Of note pt suffered a L hip fracture in August 2018 and underwent anterior approach [facility]arthro[facility] at [facility]. She was discharged to [facility]. During prior admission she was WBAT with anterior precautions.    PT Comments    Pt agreeable to PT with encouragement. Pt notes left abdominal and flank pain the is mild to moderate at times. Pt with generalized weakness; participates in supine exercises with assist and rest periods as needed. Pt encouraged to perform exercises several times per day. Pt did not wish to sit up in the recline (MD note wishes pt out of bed), but with encouragement agreeable to edge of bed. Bed mobility attempted with Max A; however, pt has loose bowel movement without warning. Pt upset; emotional support provided and  pt returned to supine comfortably in bed. Nursing assistant called on pt's behalf. Continue PT to progress strength, endurance and encourage up in/out of bed to improve functional mobility.    Follow Up Recommendations  SNF     Equipment Recommendations  None recommended by PT    Recommendations for Other Services       Precautions / Restrictions Precautions Precautions: Fall;Anterior Hip Restrictions Weight Bearing Restrictions: No    Mobility  Bed Mobility               General bed mobility comments: Attempted; pt had accidental loose bowel movement. Pt retuned to bed and nsg assistant called. Attempt was Max A  Transfers                    Ambulation/Gait                 Stairs            Wheelchair Mobility    Modified Rankin (Stroke Patients Only)       Balance                                            Cognition Arousal/Alertness: Awake/alert Behavior During Therapy: WFL for tasks assessed/performed;Agitated Overall Cognitive Status: History of cognitive impairments - at baseline  Exercises General Exercises - Lower Extremity Ankle Circles/Pumps: AROM;Both;20 reps;Supine Quad Sets: Strengthening;Both;20 reps;Supine Gluteal Sets: Strengthening;Both;20 reps;Supine Short Arc Quad: AROM;AAROM;Both;20 reps;Supine Heel Slides: AAROM;Both;20 reps;Supine Hip ABduction/ADduction: AAROM;Both;20 reps;Supine Straight Leg Raises: AAROM;Both;10 reps;Supine (2 sets)    General Comments        Pertinent Vitals/Pain Pain Assessment: Faces Faces Pain Scale: Hurts little more Pain Location: L flank/abdomen Pain Descriptors / Indicators: Aching;Sore Pain Intervention(s): Monitored during session;Limited activity within patient's tolerance    Home Living                      Prior Function            PT Goals (current goals can now be found in the  care plan section) Progress towards PT goals: Progressing toward goals    Frequency    Min 2X/week      PT Plan Current plan remains appropriate    Co-evaluation              AM-PAC PT "6 Clicks" Daily Activity  Outcome Measure  Difficulty turning over in bed (including adjusting bedclothes, sheets and blankets)?: Unable Difficulty moving from lying on back to sitting on the side of the bed? : Unable Difficulty sitting down on and standing up from a chair with arms (e.g., wheelchair, bedside commode, etc,.)?: Unable Help needed moving to and from a bed to chair (including a wheelchair)?: Total Help needed walking in hospital room?: Total Help needed climbing 3-5 steps with a railing? : Total 6 Click Score: 6    End of Session   Activity Tolerance: Patient tolerated treatment well;Patient limited by fatigue;Patient limited by pain;Other (comment) (loose bowels) Patient left: in bed;with call bell/phone within reach;with bed alarm set Nurse Communication: Other (comment) (called nsg asst for personal hygiene) PT Visit Diagnosis: Unsteadiness on feet (R26.81);Muscle weakness (generalized) (M62.81);History of falling (Z91.81);Pain;Difficulty in walking, not elsewhere classified (R26.2) Pain - Right/Left: Left Pain - part of body: Hip     Time: 9485-4627 PT Time Calculation (min) (ACUTE ONLY): 34 min  Charges:  $Therapeutic Exercise: 8-22 mins $Therapeutic Activity: 8-22 mins                    G Codes:        Larae Grooms, PTA 09/29/2016, 11:10 AM

## 2016-09-29 NOTE — Progress Notes (Signed)
CH made a follow up visit. Pt stated she is feeling 50/50. Pt stated that she is tired of the same thing every day. (Get up, eat breakfast, go to bed) [place] asked what she liked to do for fun? Pt smiled and said she used to do puzzles. [place] asked if there is a way to inject "doing puzzles" into her day. Pt said "It might be worth a try"  [place] challenged the Pt to think of other activities she could do as a form of self healing. CH offered prayer. [place] will follow up as needed.    09/29/16 1100  Clinical Encounter Type  Visited With Patient;Health care provider  Visit Type Follow-up;Spiritual support  Referral From Palliative care team  Consult/Referral To Chaplain  Spiritual Encounters  Spiritual Needs Prayer

## 2016-09-29 NOTE — Progress Notes (Signed)
Pharmacist - Prescriber Communication  Enoxaparin dose modified from 40 mg subcutaneously once daily to 30 mg subcutaneously once daily due to creatinine clearance less than 30 mL/min and actual body weight less than 45 kg.   Biana Haggar A. [place], [place].D., BCPS Clinical Pharmacist 09/29/2016 08:39

## 2016-09-29 NOTE — Progress Notes (Signed)
[facility] Day(s): 13.   Post op day(s): 13 Days Post-Op.   Interval History: Patient seen and examined, no acute events or new complaints overnight. Patient reports ongoing +flatus and +BM, denies abdominal pain, N/V, fever/chills, CP, or SOB and has been tolerating jejunal tube feeds. Bilious drainage from apex of patient's midline vertical laparotomy wound has increased since small amount first noted by RN yesterday afternoon, but not appreciated on repeat exam yesterday. When goals of care discussed with patient this morning, including focus on comfort vs continuing current therapy, patient states "I want to get better" (within the context of patient's chronic dementia and uncertain appreciation of risks/challenges).  Review of Systems:  Constitutional: denies fever, chills  HEENT: denies cough or congestion  Respiratory: denies any shortness of breath  Cardiovascular: denies chest pain or palpitations  Gastrointestinal: abdominal pain, N/V, and bowel function as per interval history Genitourinary: denies burning with urination or urinary frequency Musculoskeletal: denies pain, decreased motor or sensation Integumentary: denies any other rashes or skin discolorations except post-surgical abdominal wounds Neurological: denies HA or vision/hearing changes   Vital signs in last 24 hours: [min-max] current  Temp:  [98.2 F (36.8 C)-98.9 F (37.2 C)] 98.2 F (36.8 C) (09/19 0438) Pulse Rate:  [79-86] 79 (09/19 0438) Resp:  [16-24] 20 (09/19 0438) BP: (119-137)/(44-54) 129/54 (09/19 0438) SpO2:  [96 %-97 %] 96 % (09/19 0438) Weight:  [98 lb (44.5 kg)] 98 lb (44.5 kg) (09/19 0500)     Height: 5\' 4"  (162.6 cm) Weight: 98 lb (44.5 kg) BMI (Calculated): 16.81   Intake/Output this shift:  No intake/output data recorded.   Intake/Output last 2 shifts:  @IOLAST2SHIFTS @   Physical Exam:  Constitutional: alert, cooperative and no distress  HENT: normocephalic  without obvious abnormality  Eyes: PERRL, EOM's grossly intact and symmetric  Neuro: CN II - XII grossly intact and symmetric without deficit  Respiratory: breathing non-labored at rest  Cardiovascular: regular rate and sinus rhythm  Gastrointestinal: soft, completelynon-tender and non-distended, midline surgical incision well-approximated without erythema, non-purulent thin yellow-green fluid draining/expressable from the apex of patient's midline vertical laparotomy wound, multiple drains and gastro-jejunal decompression/feeding tube well-secured despite abdominal binder no longer applied, tube feeds ongoing Musculoskeletal: UE and LE FROM, no edema or wounds, motor and sensation grossly intact, NT   Labs:  CBC Latest Ref Rng & Units 09/27/2016 09/26/2016 09/25/2016  WBC 3.6 - 11.0 K/uL 14.8(H) 14.9(H) 17.2(H)  Hemoglobin 12.0 - 16.0 g/dL 9.9(L) 9.5(L) 9.7(L)  Hematocrit 35.0 - 47.0 % 29.4(L) 27.9(L) 28.5(L)  Platelets 150 - 440 K/uL 940(HH) 776(H) 685(H)   CMP Latest Ref Rng & Units 09/28/2016 09/27/2016 09/26/2016  Glucose 65 - 99 mg/dL 176(H) 125(H) 190(H)  BUN 6 - 20 mg/dL 12 10 10   Creatinine 0.44 - 1.00 mg/dL 0.91 0.85 0.75  Sodium 135 - 145 mmol/L 135 134(L) 135  Potassium 3.5 - 5.1 mmol/L 4.4 4.3 4.6  Chloride 101 - 111 mmol/L 100(L) 99(L) 100(L)  CO2 22 - 32 mmol/L 30 29 29   Calcium 8.9 - 10.3 mg/dL 7.3(L) 7.5(L) 7.3(L)  Total Protein 6.5 - 8.1 g/dL - 5.6(L) -  Total Bilirubin 0.3 - 1.2 mg/dL - 0.4 -  Alkaline Phos 38 - 126 U/L - 68 -  AST 15 - 41 U/L - 16 -  ALT 14 - 54 U/L - 10(L) -   Imaging studies: No new pertinent imaging studies   Assessment/Plan:(ICD-10's: K26.5) 83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 13 Days Post-Ops/p distal gastrectomy, oversewing/closure  of duodenal stump with omental patch, and placement of multiple intraperitoneal drains and retrograde gastro-jejuno-duodenal drain with gastro-jejunal feeding tubefor large perforation of acute on  chronic duodenal ulceration (1st - 2nd segment) with marked surrounding inflammation and scar tissue, but no evidence of malignancy on surgical pathology, complicated by pertinent comorbidities including advanced age, severe malnutrition, Alzheimer's dementia, HLD, CAD s/p MI, GERD with gastritis, fibrocystic Left breast disease, lumbar spinal stenosis, osteoarthritis, and a history of post-partum LLE DVT.  - continue NPO except few ice chips             - abdominal binder to protect surgically placed drains - antibiotics, including Flagyl and enteric vancomycin with jejunal tube feeds - monitor and record/trend bilious drainage from duodenal drain and Right JP drain in particular  - if requiring frequent dressing changes at apex of incision to keep surrounding skin clean/dry, consider wound/ostomy consult - continue jejunal tube feeds and octreotide for indefinite duration, possibly until duodenal stump heals - discharge planning, though family has expressed difficulty with determining goals of care - out of bed to chair, frequent repositioning to reduce risk of pressure wounds - medical management of co-morbidities - DVT prophylaxis  All of the above findings and recommendations were discussed with the patient, her RN, and case management, and all of patient's and family's questions were answered to their expressed satisfaction.  -- Marilynne Drivers Rosana Hoes, MD, [place]: [facility] - Partnering for exceptional care. Office: [phone]

## 2016-09-29 NOTE — Consult Note (Signed)
[facility] CONSULT NOTE  Patient Care Team: Rusty Aus, MD as PCP - General (Internal Medicine)  CHIEF COMPLAINTS/PURPOSE OF CONSULTATION: Thrombocytosis   HISTORY OF PRESENTING ILLNESS: Poor historian/ question dementia'no family at bedside Misty Lucas 83 y.o.  female presented to the hospital when of August 2018- for abdominal pain and CT scan noted to have duodenal perforation. Patient promptly underwent surgery- complicated by infection/long hospital stay.   Patient noted to have elevated platelets in the range of 900; reviewed the counts show more recently in early August 2018 her platelet count was normal. Patient also noted to have anemia again acutely in the beginning of August 2018.   ROS: Patient poor historian.   MEDICAL HISTORY:  Past Medical History:  Diagnosis Date  . Arthritis   . Blood clot embolism during pregnancy, antepartum 1957   left thigh  . Fibrocystic breast    left breast  . Gastritis   . History of arthroscopy of knee 1980's   right  . Hyperlipemia   . Lumbar stenosis    2 buldging disc  . Myocardial infarction [facility]) 2008    SURGICAL HISTORY: Past Surgical History:  Procedure Laterality Date  . ABDOMINAL HYSTERECTOMY    . APPENDECTOMY    . BREAST SURGERY    . CARPAL TUNNEL RELEASE Bilateral 2007  . GUM SURGERY  1980's  . HIP ARTHROPLASTY Left 08/14/2016   Procedure: ARTHROPLASTY BIPOLAR HIP (HEMIARTHROPLASTY);  Surgeon: Lovell Sheehan, MD;  Location: [facility] ORS;  Service: Orthopedics;  Laterality: Left;  . KNEE ARTHROSCOPY Bilateral 1980's  . LAPAROTOMY N/A 09/16/2016   Procedure: EXPLORATORY LAPAROTOMY, DISTAL GASTRECTOMY WITH OVERSEWING OF STOMACH, GASTROJEJUNOSTOMY, PLACEMENT OF DUODENAL DRAIN AND GJTUBE;  Surgeon: Olean Ree, MD;  Location: [facility] ORS;  Service: General;  Laterality: N/A;    SOCIAL HISTORY: Social History   Social History  . Marital status: Widowed    Spouse name: N/A  . Number of children: N/A  .  Years of education: N/A   Occupational History  . Not on file.   Social History Main Topics  . Smoking status: Former Smoker    Types: Cigarettes    Quit date: 03/26/1967  . Smokeless tobacco: Never Used  . Alcohol use No  . Drug use: No  . Sexual activity: Not on file   Other Topics Concern  . Not on file   Social History Narrative   Lives at [place]: Family History  Problem Relation Age of Onset  . Heart attack Mother   . Tuberculosis Father   . Dementia Father     ALLERGIES:  is allergic to carafate [sucralfate]; lipitor [atorvastatin]; mobic [meloxicam]; penicillins; and tramadol.  MEDICATIONS:  Current Facility-Administered Medications  Medication Dose Route Frequency Provider Last Rate Last Dose  . cefTRIAXone (ROCEPHIN) 1 g in dextrose 5 % 50 mL IVPB  1 g Intravenous Q24H Vickie Epley, MD   Stopped at 09/29/16 1411  . enoxaparin (LOVENOX) injection 30 mg  30 mg Subcutaneous Q24H Piscoya, Jose, MD   30 mg at 09/29/16 0924  . feeding supplement (OSMOLITE 1.2 CAL) liquid 1,000 mL  1,000 mL Per Tube Continuous Piscoya, Jose, MD 55 mL/hr at 09/29/16 1030 1,000 mL at 09/29/16 1030  . free water 30 mL  30 mL Per Tube Q6H Piscoya, Jose, MD   30 mL at 09/29/16 1800  . hydrALAZINE (APRESOLINE) injection 10 mg  10 mg Intravenous Q6H PRN Bettey Costa, MD      .  insulin aspart (novoLOG) injection 0-9 Units  0-9 Units Subcutaneous Q4H Olean Ree, MD   1 Units at 09/29/16 2049  . menthol-cetylpyridinium (CEPACOL) lozenge 3 mg  1 lozenge Oral PRN Piscoya, Jose, MD      . morphine 2 MG/ML injection 2 mg  2 mg Intravenous Q2H PRN Dellinger, Marianne L, PA-C   2 mg at 09/26/16 2307  . octreotide (SANDOSTATIN) injection 50 mcg  50 mcg Subcutaneous Q8H Piscoya, Jose, MD   50 mcg at 09/29/16 2050  . ondansetron (ZOFRAN) tablet 4 mg  4 mg Oral Q6H PRN Bettey Costa, MD       Or  . ondansetron (ZOFRAN) injection 4 mg  4 mg Intravenous Q6H PRN Bettey Costa, MD   4 mg  at 09/19/16 0339  . pantoprazole (PROTONIX) injection 40 mg  40 mg Intravenous Q12H Bettey Costa, MD   40 mg at 09/29/16 2119  . vancomycin (VANCOCIN) 50 mg/mL oral solution 125 mg  125 mg Per Tube Q6H Pabon, Diego F, MD   125 mg at 09/29/16 2119      .  PHYSICAL EXAMINATION:  Vitals:   09/29/16 1210 09/29/16 2010  BP: (!) 132/51 (!) 143/57  Pulse: 75 84  Resp:  16  Temp: 98.2 F (36.8 C) 98.4 F (36.9 C)  SpO2: 97% 95%   Filed Weights   09/27/16 0500 09/28/16 0300 09/29/16 0500  Weight: 107 lb 12.8 oz (48.9 kg) 103 lb 9.6 oz (47 kg) 98 lb (44.5 kg)    GENERAL: Thin built moderately nourished female patient Alert, no distress and comfortable.   She is alone. EYES: no pallor or icterus OROPHARYNX: no thrush or ulceration. NECK: supple, no masses felt LYMPH:  no palpable lymphadenopathy in the cervical, axillary or inguinal regions LUNGS: decreased breath sounds to auscultation at bases and  No wheeze or crackles HEART/CVS: regular rate & rhythm and no murmurs; No lower extremity edema ABDOMEN: abdomen soft, non-tender and normal bowel sounds. Bandages noted. Drains in place. Musculoskeletal:no cyanosis of digits and no clubbing  PSYCH: alert & oriented x 2-3 flat affect NEURO: no focal motor/sensory deficits SKIN:  no rashes or significant lesions  LABORATORY DATA:  I have reviewed the data as listed Lab Results  Component Value Date   WBC 12.3 (H) 09/29/2016   HGB 8.7 (L) 09/29/2016   HCT 26.1 (L) 09/29/2016   MCV 85.6 09/29/2016   PLT 946 (HH) 09/29/2016    Recent Labs  09/08/16 2043  09/24/16 0427  09/27/16 0330 09/28/16 0531 09/29/16 0955  NA 131*  < > 136  < > 134* 135 135  K 4.2  < > 3.8  < > 4.3 4.4 4.3  CL 97*  < > 104  < > 99* 100* 102  CO2 25  < > 25  < > 29 30 29   GLUCOSE 144*  < > 189*  < > 125* 176* 138*  BUN 18  < > 13  < > 10 12 12   CREATININE 0.75  < > 0.61  < > 0.85 0.91 0.75  CALCIUM 9.1  < > 7.3*  < > 7.5* 7.3* 7.4*  GFRNONAA >60  < >  >60  < > 59* 54* >60  GFRAA >60  < > >60  < > >60 >60 >60  PROT 7.2  < > 5.4*  --  5.6*  --  5.2*  ALBUMIN 3.0*  < > 1.2*  --  1.5*  --  1.3*  AST 23  < >  20  --  16  --  16  ALT 15  < > 14  --  10*  --  8*  ALKPHOS 80  < > 82  --  68  --  60  BILITOT 0.5  < > 0.4  --  0.4  --  0.4  BILIDIR <0.1*  --   --   --   --   --   --   IBILI NOT CALCULATED  --   --   --   --   --   --   < > = values in this interval not displayed.  RADIOGRAPHIC STUDIES: I have personally reviewed the radiological images as listed and agreed with the findings in the report. Ct Angio Chest Pe W And/or Wo Contrast  Result Date: 08/31/2016 CLINICAL DATA:  Chest pain. EXAM: CT ANGIOGRAPHY CHEST WITH CONTRAST TECHNIQUE: Multidetector CT imaging of the chest was performed using the standard protocol during bolus administration of intravenous contrast. Multiplanar CT image reconstructions and MIPs were obtained to evaluate the vascular anatomy. CONTRAST:  75 mL of Isovue 370 intravenously. COMPARISON:  Radiographs of same day. FINDINGS: Cardiovascular: Satisfactory opacification of the pulmonary arteries to the segmental level. No evidence of pulmonary embolism. Normal heart size. No pericardial effusion. Atherosclerosis of thoracic aorta is noted. Mediastinum/Nodes: 2 cm left thyroid nodule is noted. No mediastinal adenopathy is noted. The esophagus is unremarkable. Lungs/Pleura: Lungs are clear. No pleural effusion or pneumothorax. Upper Abdomen: No acute abnormality. Musculoskeletal: No chest wall abnormality. No acute or significant osseous findings. Review of the MIP images confirms the above findings. IMPRESSION: No definite evidence of pulmonary embolus. 2 cm left thyroid nodule is noted. Thyroid ultrasound is recommended for further evaluation. Aortic Atherosclerosis (ICD10-I70.0). Electronically Signed   By: Marijo Conception, M.D.   On: 08/31/2016 22:29   Dg Chest Port 1 View  Result Date: 09/08/2016 CLINICAL DATA:  Chest  pain x3 days and dyspnea EXAM: PORTABLE CHEST 1 VIEW COMPARISON:  08/13/2016 FINDINGS: Stable cardiomegaly with aortic atherosclerosis. Clear lungs. Scoliotic curvature of the lower thoracic spine convex to the left is stable appearance. IMPRESSION: 1. Cardiomegaly with aortic atherosclerosis. 2. No acute pulmonary disease. 3. Levoscoliosis of the lower thoracic spine, stable in appearance. Electronically Signed   By: Ashley Royalty M.D.   On: 09/08/2016 20:55   Dg Abd 2 Views  Result Date: 09/16/2016 CLINICAL DATA:  Diffuse abdominal pain starting this morning, constipation and abdominal pain for several months but severely worse today, history gastritis, MI, former smoker EXAM: ABDOMEN - 2 VIEW COMPARISON:  08/31/2016 FINDINGS: Lung bases clear. Increased stool throughout colon. Nonobstructive bowel gas pattern. No bowel dilatation or bowel wall thickening. No free intraperitoneal air. Osseous demineralization with biconvex thoracolumbar scoliosis and note of a LEFT hip prosthesis. Scattered costal cartilaginous calcifications without definite urinary tract calcifications. IMPRESSION: Increased stool throughout colon. Otherwise negative exam. Electronically Signed   By: Lavonia Dana M.D.   On: 09/16/2016 09:36   Dg Abdomen Acute W/chest  Result Date: 08/31/2016 CLINICAL DATA:  Pain. EXAM: DG ABDOMEN ACUTE W/ 1V CHEST COMPARISON:  None. FINDINGS: There is no evidence of dilated bowel loops or free intraperitoneal air. No radiopaque calculi or other significant radiographic abnormality is seen. Heart size and mediastinal contours are within normal limits. Both lungs are clear. IMPRESSION: Negative abdominal radiographs.  No acute cardiopulmonary disease. Electronically Signed   By: Dorise Bullion III M.D   On: 08/31/2016 19:32   Ct Angio Abd/pel  W/ And/or W/o  Result Date: 09/16/2016 CLINICAL DATA:  83 year old female with a history of ongoing abdominal pain EXAM: CTA ABDOMEN AND PELVIS WITH CONTRAST  TECHNIQUE: Multidetector CT imaging of the abdomen and pelvis was performed using the standard protocol during bolus administration of intravenous contrast. Multiplanar reconstructed images and MIPs were obtained and reviewed to evaluate the vascular anatomy. CONTRAST:  80 cc Isovue 370 COMPARISON:  None. FINDINGS: VASCULAR Aorta: Mild tortuosity of the lower thoracic and abdominal aorta. No aneurysm. No dissection flap. Scattered calcifications of the abdominal aorta. No periaortic fluid. Celiac: Moderate atherosclerotic changes at the celiac artery origin which is patent. Branch vessels are patent. SMA: Moderate atherosclerotic changes at the origin of the superior mesenteric artery which remains patent. Renals: Single bilateral renal arteries with mild atherosclerotic changes at the origin of the left renal artery. IMA: Inferior mesenteric artery is patent. Right lower extremity: Tortuous right-sided iliac system, with a regular beading appearance of the proximal external iliac artery, unchanged from prior. No significant stenosis. No dissection. Proximal femoral vasculature patent. Hypogastric artery patent. Left lower extremity: Tortuous left-sided iliac system. Hypogastric artery patent. Proximal femoral vasculature patent. Veins: Unremarkable appearance of the venous system. Review of the MIP images confirms the above findings. NON-VASCULAR Lower chest: Unremarkable. Hepatobiliary: Unremarkable appearance of liver parenchyma. Unremarkable gallbladder. Pancreas: Unremarkable pancreas Spleen: Unremarkable spleen Adrenals/Urinary Tract: Unremarkable adrenal glands Right: No hydronephrosis. Symmetric perfusion to the left. No nephrolithiasis. Unremarkable course of the right ureter. Left: No hydronephrosis. Symmetric perfusion to the right. No nephrolithiasis. Unremarkable course of the left ureter. Unremarkable appearance of the urinary bladder . Stomach/Bowel: Small hiatal hernia. Circumferential thickening of  the very pyloric region, distal stomach, with focal gaseous outpouching on the anterior wall of the proximal duodenum compatible with perforation of ulcer. This is in direct communication to free air on the anterior surface of the liver. Relatively decompressed small bowel. Moderate to large stool burden throughout the length of the colon. Fluid filled distal colon with no significant burden of formed stool in the rectum. No transition. Appendix not identified. No significant inflammatory changes adjacent to the cecum. Lymphatic: No lymphadenopathy. Mesenteric: Significant volume of free air within the abdomen. Small foci in the hepatic hilum and adjacent to the proximal duodenum, compatible with proximal small bowel perforation. Free air layers inferiorly along the anterior abdominal wall to the lower abdomen/ pelvis. Small amount of free fluid within the dependent abdomen. Reproductive: Hysterectomy Other: No abdominal hernia. Musculoskeletal: Avulsion fracture of the left anterior inferior iliac spine. Multilevel degenerative changes of the visualized thoracolumbar spine. S-shaped curvature, with right apex curvature of the lumbar spine centered at L3. Associated degenerative disc disease. Surgical changes of left hip arthroplasty IMPRESSION: Acute peri-pyloric/proximal duodenum ulcer perforation with free peritoneal air. There is associated inflammatory changes of proximal duodenum and distal stomach. These preliminary results were called by telephone at the time of interpretation on 09/16/2016 at 12:43 pm to Dr. Lenise Arena , who verbally acknowledged these results. Atherosclerotic changes of the abdominal aorta and mesenteric vessels, without high-grade stenosis of celiac artery, superior mesenteric artery, or inferior mesenteric artery. Tortuous bilateral iliac system. Irregular beaded appearance of the proximal right external iliac artery, potentially representing fibromuscular dysplasia, although an  unusual distribution. Avulsion fracture of the left anterior inferior iliac spine, present on the comparison CT. Left hip arthroplasty. Electronically Signed   By: Corrie Mckusick D.O.   On: 09/16/2016 12:54   Ct Angio Abd/pel W And/or Wo Contrast  Result Date:  09/08/2016 CLINICAL DATA:  Lower abdominal pain with constipation for 3 days EXAM: CTA ABDOMEN AND PELVIS wITHOUT AND WITH CONTRAST TECHNIQUE: Multidetector CT imaging of the abdomen and pelvis was performed using the standard protocol during bolus administration of intravenous contrast. Multiplanar reconstructed images and MIPs were obtained and reviewed to evaluate the vascular anatomy. CONTRAST:  100 mL Isovue 370 intravenous COMPARISON:  Radiograph 821 1,018 FINDINGS: VASCULAR Aorta: No aneurysmal dilatation. Atherosclerotic calcifications. No dissection is seen. Celiac: Calcification at the origin with mild narrowing. Distal vessel branch vessels are patent SMA: Small amount of calcification at the origin. No significant stenosis. No filling defects Renals: Single right and single left renal artery. Mild narrowing at the origin of left renal artery. Normal right renal artery. IMA: Patent without evidence of aneurysm, dissection, vasculitis or significant stenosis. Inflow: Atherosclerotic calcifications. No hemodynamically significant stenosis or occlusive disease. Proximal Outflow: Bilateral common femoral and visualized portions of the superficial and profunda femoral arteries are patent without evidence of aneurysm, dissection, vasculitis or significant stenosis. Veins: No obvious venous abnormality within the limitations of this arterial phase study. Review of the MIP images confirms the above findings. NON-VASCULAR Lower chest: Calcified granuloma in the right lower lobe. No consolidation or effusion. Mitral calcification. Normal heart size. Hepatobiliary: No focal liver abnormality is seen. No gallstones, gallbladder wall thickening, or biliary  dilatation. Pancreas: Unremarkable. No pancreatic ductal dilatation or surrounding inflammatory changes. Spleen: Normal in size without focal abnormality. Adrenals/Urinary Tract: Adrenal glands are unremarkable. Kidneys are normal, without renal calculi, focal lesion, or hydronephrosis. Bladder is unremarkable. Prominent extrarenal pelvis bilaterally. Stomach/Bowel: Questionable wall thickening of the distal stomach with mild mucosal enhancement. No dilated small bowel. Diffuse fluid-filled colon. Moderate stool in the left colon. Moderate feces retention in the rectum. No wall thickening or intramural air. Nonvisualized appendix Lymphatic: No significantly enlarged abdominal or pelvic lymph nodes Reproductive: Status post hysterectomy. No adnexal masses. Other: Negative for free air or free fluid Musculoskeletal: Scoliosis and degenerative changes. No acute or suspicious bone lesion. Status post left hip replacement with artifact IMPRESSION: VASCULAR 1. Scattered atherosclerotic calcifications of the aorta and branch vessels but no evidence for hemodynamically significant stenosis or occlusive disease. NON-VASCULAR 1. Fluid-filled right and transverse colon with moderate stool in the left colon and moderate feces retention in the rectum. Negative for bowel obstruction or colon wall thickening. Negative for intramural air, mesenteric venous gas, or free air 2. Suggested mild wall thickening of the distal stomach with prominent mucosal enhancement, query gastritis Electronically Signed   By: Donavan Foil M.D.   On: 09/08/2016 22:47    ASSESSMENT & PLAN:   # 83 year old female patient with mild-to-moderate dementia- currently admitted to the hospital for duodenal perforation status post laparotomy.  # Thrombocytosis-900 today; likely reactive- secondary to underlying infection/recent surgery/blood loss/iron deficiency. Rule out myeloproliferative process- check BCR ABL; jack 2 with reflex. Iron studies  ferritin. As this is likely reactive no new recommendations from hematology standpoint. We'll watch out for the results of myeloproliferative panels.   # Anemia multifactorial- awaiting iron studies; if hemoglobin less than 8 transfusion. If iron studies suggestive of deficiency recommend IV iron transfusion.  # Duodenal perforation status post surgery. As per primary team.  Thank you Dr.Piscoya for allowing me to participate in the care of your pleasant patient. Please do not hesitate to contact me with questions or concerns in the interim.   All questions were answered. The patient knows to call the clinic with any problems, questions  or concerns.    Cammie Sickle, MD 09/29/2016 9:53 PM

## 2016-09-29 NOTE — Progress Notes (Addendum)
Brief Nutrition Note  We are running low on Osmolite 1.2. If there is no Osmolite 1.2 available can switch pt to Osmolite 1.5 at goal rate of 47ml/hr. Regimen provides 1800kcal/day, 75g/day protein, and 932ml/day free water.   Koleen Distance MS, RD, LDN Pager #- [phone] After Hours Pager: [phone]

## 2016-09-29 NOTE — Progress Notes (Signed)
[place] [facility] [place] NAME: Misty Lucas    MR#:  [id number]  DATE OF BIRTH:  11/05/1924  Has no shortness of breath. No fever. Still has copious amount of drainage from abdominal drains. Nothing by mouth now, tolerating the tube feeding.   CHIEF COMPLAINT:   Chief Complaint  Patient presents with  . Abdominal Pain  . Constipation    REVIEW OF SYSTEMS:   Review of Systems  Constitutional: Negative for chills, fever and malaise/fatigue.  HENT: Negative for hearing loss, nosebleeds and tinnitus.   Eyes: Negative for blurred vision, double vision and photophobia.  Respiratory: Negative for cough, hemoptysis and shortness of breath.   Cardiovascular: Negative for chest pain, palpitations, orthopnea and leg swelling.  Gastrointestinal: Negative for abdominal pain, diarrhea, nausea and vomiting.  Genitourinary: Negative for dysuria and urgency.  Musculoskeletal: Negative for myalgias and neck pain.  Skin: Negative for itching and rash.  Neurological: Negative for dizziness, tingling, focal weakness, seizures, weakness and headaches.  Psychiatric/Behavioral: Negative for memory loss. The patient does not have insomnia.    confuse DRUG ALLERGIES:   Allergies  Allergen Reactions  . Carafate [Sucralfate] Other (See Comments)    Unknown, pt does not remember  . Lipitor [Atorvastatin] Other (See Comments)    Unknown, pt can't remember   . Mobic [Meloxicam] Other (See Comments)    Pt does not remember  . Penicillins Other (See Comments)    Pt does not remember  . Tramadol Other (See Comments)    Pt does not remember    VITALS:  Blood pressure (!) 129/54, pulse 79, temperature 98.2 F (36.8 C), temperature source Oral, resp. rate 20, height 5\' 4"  (1.626 m), weight 44.5 kg (98 lb), SpO2 96 %.  PHYSICAL EXAMINATION:  GENERAL:  83 y.o.-year-old patient lying in the bed with no acute distress.overall all appears very weak. EYES:  Pupils equal, round, reactive to light  . No scleral icterus.  HEENT: Head atraumatic, normocephalic. Oropharynx and nasopharynx clear.  NECK:  Supple, no jugular venous distention. No thyroid enlargement, no tenderness.  LUNGS: Normal breath sounds bilaterally, no wheezing, rales,rhonchi or crepitation. No use of accessory muscles of respiration.  CARDIOVASCULAR: S1, S2 normal. No murmurs, rubs, or gallops.  ABDOMEN;midline incision is clean under Honeycomb dressing. Abdominal binder is present Right-sided JP drain has blood-tinged fluid. Left JP   drain  Is serosanguineous.duodenal  Drain is bilious. Midline also is draining right now. EXTREMITIES: No pedal edema, cyanosis, or clubbing.  NEUROLOGIC: Cranial nerves II through XII are intact. Muscle strength 3/5 in all extremities. Sensation intact. Gait not checked.  PSYCHIATRIC: The patient is alert and oriented x 0.  SKIN: No obvious rash, lesion, or ulcer.    LABORATORY PANEL:   CBC  Recent Labs Lab 09/27/16 0330  WBC 14.8*  HGB 9.9*  HCT 29.4*  PLT 940*   ------------------------------------------------------------------------------------------------------------------  Chemistries   Recent Labs Lab 09/27/16 0330 09/28/16 0531  NA 134* 135  K 4.3 4.4  CL 99* 100*  CO2 29 30  GLUCOSE 125* 176*  BUN 10 12  CREATININE 0.85 0.91  CALCIUM 7.5* 7.3*  MG 1.9  --   AST 16  --   ALT 10*  --   ALKPHOS 68  --   BILITOT 0.4  --    ------------------------------------------------------------------------------------------------------------------  Cardiac Enzymes No results for input(s): TROPONINI in the last 168 hours. ------------------------------------------------------------------------------------------------------------------  RADIOLOGY:  No results found.  EKG:   Orders  placed or performed during the hospital encounter of 09/16/16  . EKG 12-Lead  . EKG 12-Lead    ASSESSMENT AND PLAN:   # Acute abdominal  pain  secondary to bowel perforation status post emergency surgery for duodenal perforation, patient had gastrectomy with gastrojejunostomy with the duodenaldrain. Patient has baseline  dementia.  Continue jejunal feeds, tolerating them well. Management for surgery . Octreotide drip, possible transfer to [facility] as per case manager note. Pathology result confirmed no cancer.  # sepsis secondary to #1. Continue Zosyn , vanc , flagyl # c diff colitis   Vanc via feeding tube is started. Already on Flagyl also. Leukocytosis is improving patient has thrombocytosis now. # hypo-phosphetemia/hypokalemia   Replete, pharmacy consulted for electrolite management as needed basis  # elevated troponin secondary to #1.  # Alzheimer's dementia.; Currently patient is alert and communicating. Palliative care consulted- plan was to sent to hospice, but as no malignancy on report, management as per surgery.  Physical therapy recommended skilled nursing, family [facility]. Because of her tube feedings, octreotide . patient is to go to LTAC.  Thrombocytosis; likely due to sepsis. Recommend hematology evaluation.  We'll sign off, recall if needed. No medical intervention needed at this time. CODE STATUS is DNR  Poor prognosis    All the records are reviewed and case discussed with Care Management/Social Worker.  CODE STATUS: DNR TOTAL TIME TAKING CARE OF THIS PATIENT: 35 minutes.   POSSIBLE D/C IN 1-2 DAYS, DEPENDING ON CLINICAL CONDITION. Prognosis is very poor.  Epifanio Lesches M.D on 09/29/2016 at 9:52 AM  Between 7am to 6pm - Pager - [phone].  After 6pm go to [email or web address] - password [email or web address] [facility]  Office  [phone]  CC: Primary care physician; Rusty Aus, MD   Note: This dictation was prepared with Dragon dictation along with smaller phrase technology. Any transcriptional errors that result from this process are unintentional.

## 2016-09-30 LAB — BASIC METABOLIC PANEL
ANION GAP: 6 (ref 5–15)
BUN: 11 mg/dL (ref 6–20)
CO2: 28 mmol/L (ref 22–32)
Calcium: 7.4 mg/dL — ABNORMAL LOW (ref 8.9–10.3)
Chloride: 100 mmol/L — ABNORMAL LOW (ref 101–111)
Creatinine, Ser: 0.79 mg/dL (ref 0.44–1.00)
Glucose, Bld: 218 mg/dL — ABNORMAL HIGH (ref 65–99)
POTASSIUM: 4.7 mmol/L (ref 3.5–5.1)
SODIUM: 134 mmol/L — AB (ref 135–145)

## 2016-09-30 LAB — CBC
HCT: 28.1 % — ABNORMAL LOW (ref 35.0–47.0)
HEMOGLOBIN: 9.4 g/dL — AB (ref 12.0–16.0)
MCH: 28.3 pg (ref 26.0–34.0)
MCHC: 33.6 g/dL (ref 32.0–36.0)
MCV: 84.4 fL (ref 80.0–100.0)
PLATELETS: 1052 10*3/uL — AB (ref 150–440)
RBC: 3.33 MIL/uL — AB (ref 3.80–5.20)
RDW: 15.3 % — ABNORMAL HIGH (ref 11.5–14.5)
WBC: 11.3 10*3/uL — AB (ref 3.6–11.0)

## 2016-09-30 LAB — GLUCOSE, CAPILLARY
GLUCOSE-CAPILLARY: 190 mg/dL — AB (ref 65–99)
GLUCOSE-CAPILLARY: 157 mg/dL — AB (ref 65–99)
GLUCOSE-CAPILLARY: 175 mg/dL — AB (ref 65–99)

## 2016-09-30 LAB — PHOSPHORUS: PHOSPHORUS: 3.2 mg/dL (ref 2.5–4.6)

## 2016-09-30 LAB — MAGNESIUM: MAGNESIUM: 2.1 mg/dL (ref 1.7–2.4)

## 2016-09-30 MED ORDER — ONDANSETRON HCL 4 MG PO TABS: 4.0000 mg | ORAL_TABLET | Freq: Four times a day (QID) | ORAL | 0 refills | Status: AC | PRN

## 2016-09-30 MED ORDER — OCTREOTIDE ACETATE 100 MCG/ML IJ SOLN: 50.0000 ug | Freq: Three times a day (TID) | INTRAMUSCULAR | Status: AC

## 2016-09-30 MED ORDER — FREE WATER: 30.0000 mL | Freq: Four times a day (QID) | Status: AC

## 2016-09-30 MED ORDER — ENOXAPARIN SODIUM 40 MG/0.4ML ~~LOC~~ SOLN: 40.0000 mg | SUBCUTANEOUS | Status: DC

## 2016-09-30 MED ORDER — DEXTROSE 5 % IV SOLN
1.0000 g | INTRAVENOUS | 0 refills | Status: AC
Start: 2016-10-01 — End: ?

## 2016-09-30 MED ORDER — MENTHOL 3 MG MT LOZG: 1.0000 | LOZENGE | OROMUCOSAL | 12 refills | Status: AC | PRN

## 2016-09-30 MED ORDER — HYDRALAZINE HCL 20 MG/ML IJ SOLN: 10.0000 mg | Freq: Four times a day (QID) | INTRAMUSCULAR | Status: AC | PRN

## 2016-09-30 MED ORDER — VANCOMYCIN 50 MG/ML ORAL SOLUTION
125.0000 mg | Freq: Four times a day (QID) | ORAL | Status: AC
Start: 2016-09-30 — End: ?

## 2016-09-30 MED ORDER — ENOXAPARIN SODIUM 40 MG/0.4ML ~~LOC~~ SOLN: 40.0000 mg | SUBCUTANEOUS | Status: AC

## 2016-09-30 MED ORDER — INSULIN ASPART 100 UNIT/ML ~~LOC~~ SOLN: 0.0000 [IU] | SUBCUTANEOUS | 11 refills | Status: AC

## 2016-09-30 MED ORDER — PANTOPRAZOLE SODIUM 40 MG IV SOLR: 40.0000 mg | Freq: Two times a day (BID) | INTRAVENOUS | Status: AC

## 2016-09-30 MED ORDER — OSMOLITE 1.2 CAL PO LIQD: 1000.0000 mL | ORAL | 0 refills | Status: AC

## 2016-09-30 MED ORDER — MORPHINE SULFATE (PF) 2 MG/ML IV SOLN: 2.0000 mg | INTRAVENOUS | 0 refills | Status: AC | PRN

## 2016-09-30 NOTE — Progress Notes (Signed)
Pharmacist - Prescriber Communication  Enoxaparin dose modified from 40 mg subcutaneously once daily to 30 mg subcutaneously once daily due to creatinine clearance less than 30 mL/min and actual body weight less than 45 kg.    9/20 08:48 Enoxaparin dose modified from 30 mg subcutaneously once daily to 40 mg subcutaneously once daily due to creatinine clearance greater than 30 mL/min and actual body weight greater than 45 kg.   Ziyan Schoon A. Force, [place].D., BCPS Clinical Pharmacist 09/29/2016 08:39

## 2016-09-30 NOTE — Care Management Note (Signed)
Case Management Note  Patient Details  Name: ASJIA BERRIOS MRN: [id number] Date of Birth: June 19, 1924   Patient to discharge to [facility] today.  Bedside RN has called report, and EMS transportation. RNCM signing off  Subjective/Objective:                    Action/Plan:   Expected Discharge Date:                  Expected Discharge Plan:  Long Term Acute Care (LTAC)  In-House Referral:     Discharge planning Services  CM Consult  Post Acute Care Choice:  Long Term Acute Care (LTAC) Choice offered to:  Adult Children  DME Arranged:    DME Agency:     HH Arranged:    HH Agency:     Status of Service:  Completed, signed off  If discussed at [place] of Stay Meetings, dates discussed:    Additional Comments:  Beverly Sessions, RN 09/30/2016, 3:02 PM

## 2016-09-30 NOTE — Progress Notes (Signed)
Patient was discharged today. EMS picked up patient around 16:00. Report given to receiving nurse at [place]. Discharge instructions faxed to [facility]. MD to complete the discharge summary.

## 2016-09-30 NOTE — Care Management Important Message (Signed)
Important Message  Patient Details  Name: Misty Lucas MRN: [id number] Date of Birth: July 17, 1924   Medicare Important Message Given:  Yes    Beverly Sessions, RN 09/30/2016, 11:31 AM

## 2016-09-30 NOTE — Discharge Summary (Signed)
Physician Discharge Summary  Patient ID: Misty Lucas MRN: [id number] DOB/AGE: [birth year] 81 y.o.  Admit date: 09/16/2016 Discharge date: 09/30/2016  Admission Diagnoses:  Discharge Diagnoses:  Active Problems:   Sepsis ([place])   Elevated troponin I level   Palliative care by specialist   Encounter for hospice care discussion   Discharged Condition: fair  Hospital Course: 83 year old female with chronic dementia and a history of GERD no longer on any medication for GERD presented to [facility] ED with upper abdominal pain x 1 week and was diagnosed with perforated duodenum with large volume pneumoperitoneum. Perforated duodenal ulcer requiring Phillip Heal omental patch was anticipated, but intra-operatively complete disruption of 1st - 2nd segment of duodenum was found along with extensive associated thick firm duodenal and peri-duodenal tissue, concerning for malignancy. Due to the proximity of biliopancreatic ampulla of Vater from the CBD into the duodenum not all of the widely disrupted and perforated tissue could be resected along with distal gastrectomy and 1st segment of duodenum. 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 Post-operatively, patient was extubated and transferred from the ICU, and her pain was able to be well-controlled. When anticipated duodenal fistula developed, intra-duodenal drain was placed to low intermittent suction, octreotide was started, other tubes remained to bulb and gravity suction, respectively, and patient tolerated NPO with jejunal tube feeds without difficulty. Additional Prevena-like negative pressure dressing was applied over Adaptec petroleum gauze and well-approximated midline laparotomy wound to control thin bilious drainage and  protect surrounding skin effectively, and leukocytosis resolved. With patient remaining stable as described above, discharge planning was initiated accordingly, and patient was safely able to be discharged to [facility] with appropriate discharge instructions and outpatient surgical follow-up after all of patient's and family's questions were answered to their expressed satisfaction.  Consults: cardiology, pulmonary/intensive care, ID and hematology/oncology  Significant Diagnostic Studies: radiology: CT scan: duodenal perforation with pneumoperitoneum  Treatments: IV hydration, antibiotics: vancomycin, Zosyn, ceftriaxone and metronidazole and surgery: Exploratory laparotomy with distal gastrectomy; closure of duodenal stump with omental patch; retrocolic gastrojejunostomy; placement of G-J feeding tube into efferent gastrojejunostomy limb, retrograde intra-duodenal drainage tube, and peri-duodenal drains x2; subsequent bedside application of superficial negative pressure dressing over [employer] (petroleum gauze) and closed wound to control incompletely controlled duodenal-cutaneous fistula  Discharge Exam: Blood pressure (!) 154/58, pulse 87, temperature 98.3 F (36.8 C), temperature source Oral, resp. rate (!) 24, height 5\' 4"  (1.626 m), weight 99 lb 12.8 oz (45.3 kg), SpO2 100 %. General appearance: alert, cooperative, no distress and chronic dementia GI: soft, completely non-tender and non-distended, midline surgical incision well-approximated without erythema, VAC negative pressure dressing applied over Adaptec petroleum gauze to close midline laparotomy wound for control of otherwise incompletely controlled duodenal-cutaneous fistula, multiple drains and gastro-jejunal decompression/feeding tube well-secured, abdominal binder replaced, tube feeds ongoing  Disposition: 01-Home or Self Care   Allergies as of 09/30/2016      Reactions   Carafate [sucralfate] Other (See Comments)   Unknown, pt  does not remember   Lipitor [atorvastatin] Other (See Comments)   Unknown, pt can't remember   Mobic [meloxicam] Other (See Comments)   Pt does not remember   Penicillins Other (See Comments)   Pt does not remember   Tramadol Other (See Comments)   Pt does not remember      Medication  List    STOP taking these medications   acetaminophen 500 MG tablet Commonly known as:  TYLENOL   aspirin 325 MG EC tablet   CENTRUM SILVER ULTRA WOMENS PO   diclofenac 50 MG EC tablet Commonly known as:  VOLTAREN   docusate sodium 100 MG capsule Commonly known as:  COLACE   hydrochlorothiazide 25 MG tablet Commonly known as:  HYDRODIURIL   metoprolol tartrate 25 MG tablet Commonly known as:  LOPRESSOR   oxyCODONE 5 MG immediate release tablet Commonly known as:  Oxy IR/ROXICODONE   oyster calcium 500 MG Tabs tablet   senna 8.6 MG Tabs tablet Commonly known as:  SENOKOT   sertraline 25 MG tablet Commonly known as:  ZOLOFT     TAKE these medications   cefTRIAXone 1 g in dextrose 5 % 50 mL Inject 1 g into the vein daily.   enoxaparin 40 MG/0.4ML injection Commonly known as:  LOVENOX Inject 0.4 mLs (40 mg total) into the skin daily.   feeding supplement (OSMOLITE 1.2 CAL) Liqd Place 1,000 mLs into feeding tube continuous.   free water Soln Place 30 mLs into feeding tube every 6 (six) hours.   hydrALAZINE 20 MG/ML injection Commonly known as:  APRESOLINE Inject 0.5 mLs (10 mg total) into the vein every 6 (six) hours as needed (SBP>180).   insulin aspart 100 UNIT/ML injection Commonly known as:  novoLOG Inject 0-9 Units into the skin every 4 (four) hours.   menthol-cetylpyridinium 3 MG lozenge Commonly known as:  CEPACOL Take 1 lozenge (3 mg total) by mouth as needed for sore throat.   morphine 2 MG/ML injection Inject 1 mL (2 mg total) into the vein every 2 (two) hours as needed (SOB).   octreotide 100 MCG/ML Soln injection Commonly known as:  SANDOSTATIN Inject  0.5 mLs (50 mcg total) into the skin every 8 (eight) hours.   ondansetron 4 MG tablet Commonly known as:  ZOFRAN Take 1 tablet (4 mg total) by mouth every 6 (six) hours as needed for nausea.   pantoprazole 40 MG injection Commonly known as:  PROTONIX Inject 40 mg into the vein every 12 (twelve) hours.   vancomycin 50 mg/mL oral solution Commonly known as:  VANCOCIN Place 2.5 mLs (125 mg total) into feeding tube every 6 (six) hours.            Discharge Care Instructions        Start     Ordered   10/01/16 0000  Water For Irrigation, Sterile (FREE WATER) SOLN  Every 6 hours     09/30/16 1859   10/01/16 0000  cefTRIAXone 1 g in dextrose 5 % 50 mL  Every 24 hours     09/30/16 1859   10/01/16 0000  enoxaparin (LOVENOX) 40 MG/0.4ML injection  Every 24 hours     09/30/16 1859   09/30/16 0000  hydrALAZINE (APRESOLINE) 20 MG/ML injection  Every 6 hours PRN     09/30/16 1859   09/30/16 0000  ondansetron (ZOFRAN) 4 MG tablet  Every 6 hours PRN     09/30/16 1859   09/30/16 0000  pantoprazole (PROTONIX) 40 MG injection  Every 12 hours     09/30/16 1859   09/30/16 0000  menthol-cetylpyridinium (CEPACOL) 3 MG lozenge  As needed     09/30/16 1859   09/30/16 0000  insulin aspart (NOVOLOG) 100 UNIT/ML injection  Every 4 hours     09/30/16 1859   09/30/16 0000  morphine 2 MG/ML injection  Every 2 hours PRN     09/30/16 1859   09/30/16 0000  octreotide (SANDOSTATIN) 100 MCG/ML SOLN injection  Every 8 hours     09/30/16 1859   09/30/16 0000  vancomycin (VANCOCIN) 50 mg/mL oral solution  Every 6 hours     09/30/16 1859   09/30/16 0000  Nutritional Supplements (FEEDING SUPPLEMENT, OSMOLITE 1.2 CAL,) LIQD  Continuous     09/30/16 1859      Contact information for follow-up providers    [facility]. Schedule an appointment as soon as possible for a visit in 2 week(s).   Specialty:  General Surgery Contact information: [address] [place] [phone]           Contact information for after-discharge care    [facility] Follow up.   Specialty:  [place] information: [place] [phone]                  Signed: Vickie Epley 09/30/2016, 7:00 PM

## 2016-09-30 NOTE — Progress Notes (Signed)
Wound vac to abdomen removed and replaced with dry dressing to abdomen.

## 2016-09-30 NOTE — Care Management (Signed)
Patient authorized for admission at [facility].  Patient will go to room 321.  Accepting MD Dr. Reesa Chew.  Bedside RN to call report to [phone].  Message has been left with son [name] to update.  RNCM awaiting return call.

## 2016-09-30 NOTE — Consult Note (Signed)
MEDICATION RELATED CONSULT NOTE -FOLLOW UP  Pharmacy Consult for electrolytes Indication: low phosphorus (refeeding)  Allergies  Allergen Reactions  . Carafate [Sucralfate] Other (See Comments)    Unknown, pt does not remember  . Lipitor [Atorvastatin] Other (See Comments)    Unknown, pt can't remember   . Mobic [Meloxicam] Other (See Comments)    Pt does not remember  . Penicillins Other (See Comments)    Pt does not remember  . Tramadol Other (See Comments)    Pt does not remember    Patient Measurements: Height: 5\' 4"  (162.6 cm) Weight: 99 lb 12.8 oz (45.3 kg) IBW/kg (Calculated) : 54.7 Adjusted Body Weight:   Vital Signs: Temp: 97.9 F (36.6 C) (09/20 0457) Temp Source: Oral (09/19 2010) BP: 155/48 (09/20 0457) Pulse Rate: 80 (09/20 0457) Intake/Output from previous day: 09/19 0701 - 09/20 0700 In: 1325.5 [NG/GT:1325.5] Out: 355 [Drains:355] Intake/Output from this shift: No intake/output data recorded.  Labs:  Recent Labs  09/28/16 0531 09/29/16 0955 09/30/16 0512  WBC  --  12.3* 11.3*  HGB  --  8.7* 9.4*  HCT  --  26.1* 28.1*  PLT  --  946* 1,052*  CREATININE 0.91 0.75 0.79  MG  --   --  2.1  PHOS 3.0  --  3.2  ALBUMIN  --  1.3*  --   PROT  --  5.2*  --   AST  --  16  --   ALT  --  8*  --   ALKPHOS  --  60  --   BILITOT  --  0.4  --    Estimated Creatinine Clearance: 33.4 mL/min (by C-G formula based on SCr of 0.79 mg/dL).   Microbiology: Recent Results (from the past 720 hour(s))  Urine Culture     Status: Abnormal   Collection Time: 09/16/16 10:23 AM  Result Value Ref Range Status   Specimen Description URINE, RANDOM  Final   Special Requests NONE  Final   Culture >=100,000 COLONIES/mL ESCHERICHIA COLI (A)  Final   Report Status 09/18/2016 FINAL  Final   Organism ID, Bacteria ESCHERICHIA COLI (A)  Final      Susceptibility   Escherichia coli - MIC*    AMPICILLIN >=32 RESISTANT Resistant     CEFAZOLIN 16 SENSITIVE Sensitive      CEFTRIAXONE <=1 SENSITIVE Sensitive     CIPROFLOXACIN <=0.25 SENSITIVE Sensitive     GENTAMICIN <=1 SENSITIVE Sensitive     IMIPENEM <=0.25 SENSITIVE Sensitive     NITROFURANTOIN <=16 SENSITIVE Sensitive     TRIMETH/SULFA <=20 SENSITIVE Sensitive     AMPICILLIN/SULBACTAM >=32 RESISTANT Resistant     PIP/TAZO <=4 SENSITIVE Sensitive     Extended ESBL NEGATIVE Sensitive     * >=100,000 COLONIES/mL ESCHERICHIA COLI  MRSA PCR Screening     Status: None   Collection Time: 09/16/16  7:56 PM  Result Value Ref Range Status   MRSA by PCR NEGATIVE NEGATIVE Final    Comment:        The GeneXpert MRSA Assay (FDA approved for NASAL specimens only), is one component of a comprehensive MRSA colonization surveillance program. It is not intended to diagnose MRSA infection nor to guide or monitor treatment for MRSA infections.   C difficile quick scan w PCR reflex     Status: Abnormal   Collection Time: 09/22/16  5:58 PM  Result Value Ref Range Status   C Diff antigen POSITIVE (A) NEGATIVE Final   C Diff toxin  NEGATIVE NEGATIVE Final   C Diff interpretation Results are indeterminate. See PCR results.  Final  Clostridium Difficile by PCR     Status: Abnormal   Collection Time: 09/22/16  5:58 PM  Result Value Ref Range Status   Toxigenic C Difficile by pcr POSITIVE (A) NEGATIVE Final    Comment: Positive for toxigenic C. difficile with little to no toxin production. Only treat if clinical presentation suggests symptomatic illness.    Medical History: Past Medical History:  Diagnosis Date  . Arthritis   . Blood clot embolism during pregnancy, antepartum 1957   left thigh  . Fibrocystic breast    left breast  . Gastritis   . History of arthroscopy of knee 1980's   right  . Hyperlipemia   . Lumbar stenosis    2 buldging disc  . Myocardial infarction ([place]) 2008    Medications:  Scheduled:  . enoxaparin (LOVENOX) injection  30 mg Subcutaneous Q24H  . free water  30 mL Per Tube Q6H   . insulin aspart  0-9 Units Subcutaneous Q4H  . octreotide  50 mcg Subcutaneous Q8H  . pantoprazole (PROTONIX) IV  40 mg Intravenous Q12H  . vancomycin  125 mg Per Tube Q6H    Assessment: Patient is a 83 year old female with a bowl perforation s/p gastrectomy w/ GJ tube. Pt started on tube feeds. Pt has severe malnutrition and is at risk for refeeding syndrome. Pharmacy consulted to manage electrolytes.  All labs within normal limits.   Goal of Therapy:  Normalization of electrolytes  Plan: No supplementation warranted @ this time. Recheck labs in 2 days.   9/18 0531 K 4.4, Ca 7.3, albumun 1.5, adjusted Ca 9.3, Phos 3, Mg not assessed. No further supplement warranted at this time. Will recheck labs in 2 days.  9/20 0512 K 4.7, Ca 7.4, albumin 1.3, adjusted Ca 9.56, Phos 3.2, Mg 2.1. No further supplement warranted at this time. Will recheck labs in 3 days.   Misty Lucas A. Jordan Hawks, PharmD, BCPS Clinical Pharmacist 09/30/2016 7:25 AM

## 2016-10-01 ENCOUNTER — Telehealth: Payer: Self-pay

## 2016-10-01 NOTE — Telephone Encounter (Signed)
Post-op call made to patient at this time. Spoke with Atleigh Gruen (patient's son). He stated that his mother was transferred to [facility] yesterday. He stated that his mother is been taken care of and was not able to bring her for an appointment.  I will inform Dr. Hampton Abbot that patient was transferred to [facility] so he could tell me what to do for her.

## 2016-10-02 DIAGNOSIS — K316 Fistula of stomach and duodenum: Secondary | ICD-10-CM

## 2016-11-11 DEATH — deceased

## 2019-01-05 IMAGING — CR DG HIP (WITH PELVIS) OPERATIVE*L*
4 series · 4 of 4 positions shown · non-contrast
Comparison: None.

CLINICAL DATA: Intra-op left anterior hip replacement, fluoro time
12 sec

EXAM:
OPERATIVE LEFT HIP (WITH PELVIS IF PERFORMED)  VIEWS
TECHNIQUE: Fluoroscopic spot image(s) were submitted for interpretation
post-operatively.

[cont. (1 of 4)]
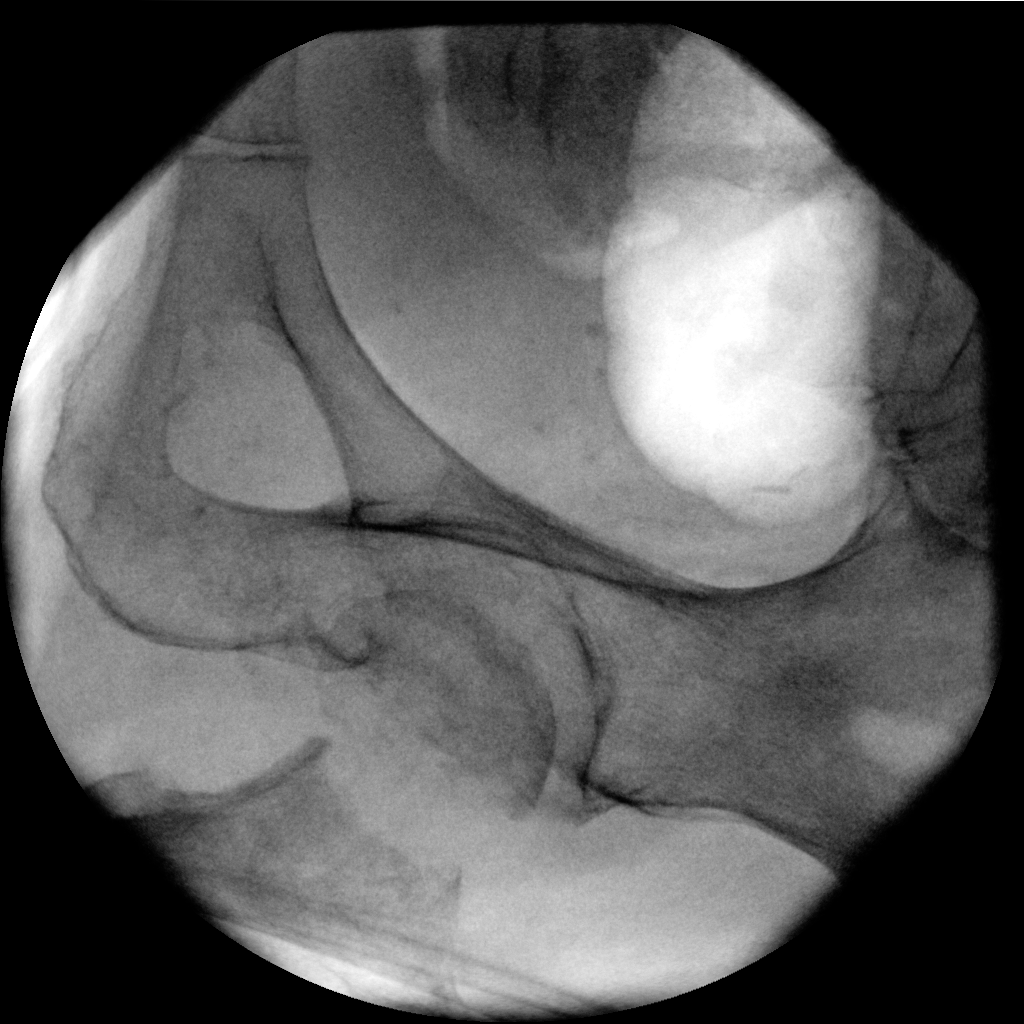

[cont. (2 of 4)]
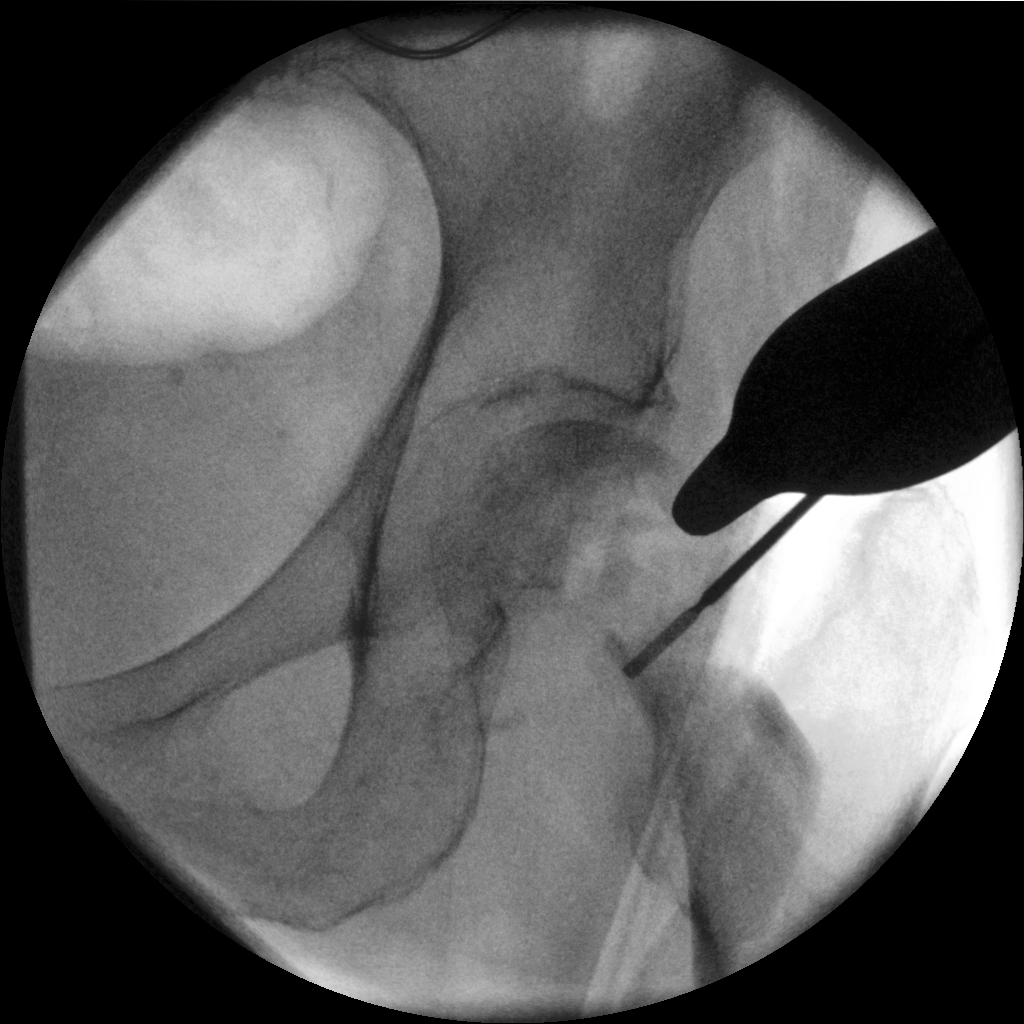

[cont. (3 of 4)]
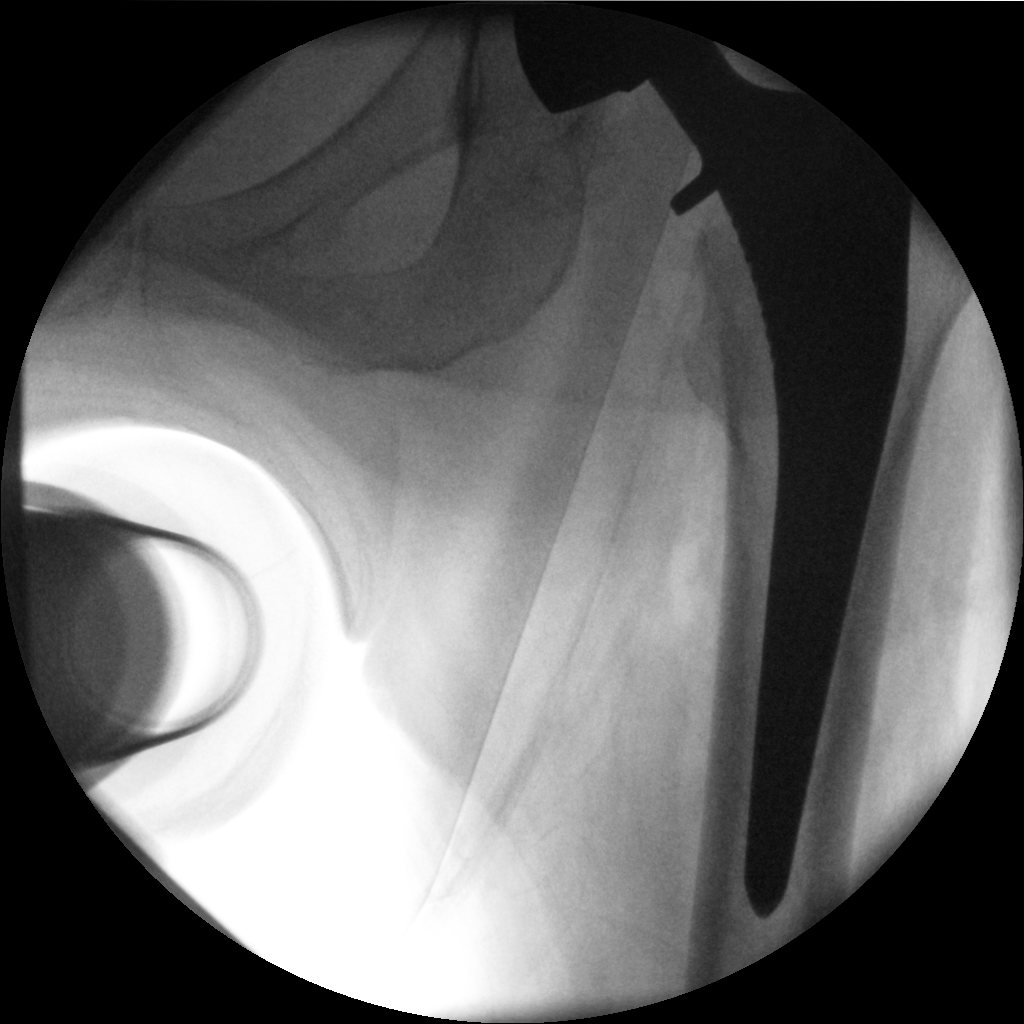

[cont. (4 of 4)]
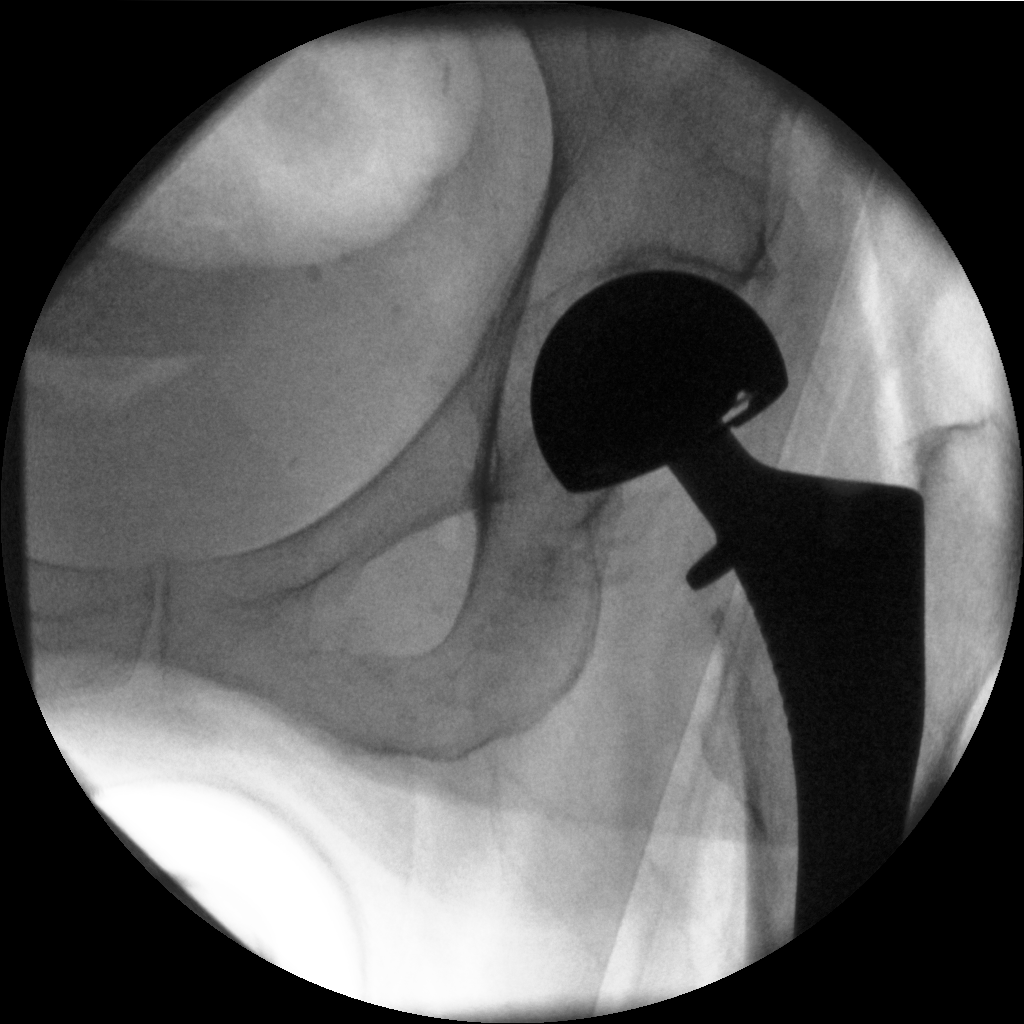

[4 of 4 positions shown; findings below may reference images not displayed]

FINDINGS: Four intraoperative fluoroscopic spot images are provided showing
placement of left hip arthroplasty hardware. Final images show the
hardware to be appropriately positioned. Adjacent osseous structures
appear anatomic in configuration.

Fluoroscopy provided for 12 seconds.
IMPRESSION: Intraoperative fluoroscopic spot images demonstrating left hip
arthroplasty. Hardware appears appropriately positioned. No evidence
of surgical complicating feature.

## 2019-01-22 IMAGING — CR DG ABDOMEN ACUTE W/ 1V CHEST
3 series · 3 of 3 positions shown · non-contrast
Comparison: None.

CLINICAL DATA: Pain.

EXAM:
DG ABDOMEN ACUTE W/ 1V CHEST

[abdomen erect]
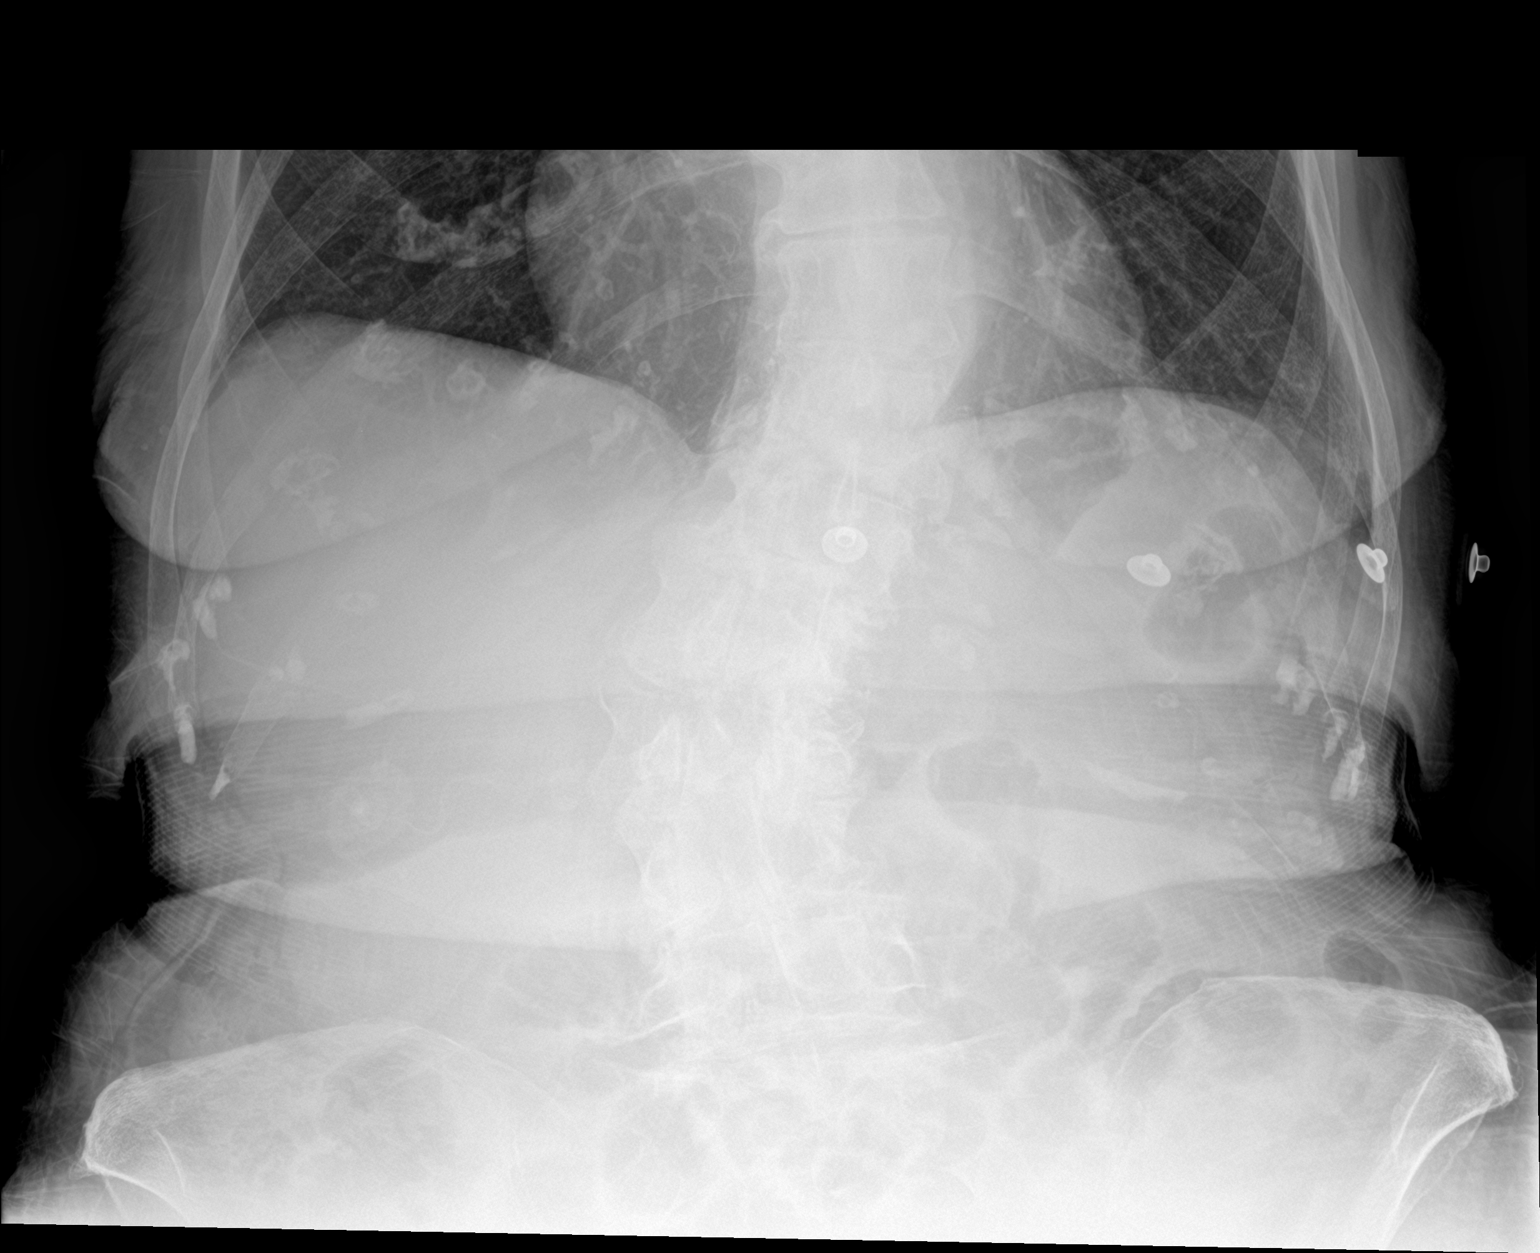

[abdomen supine]
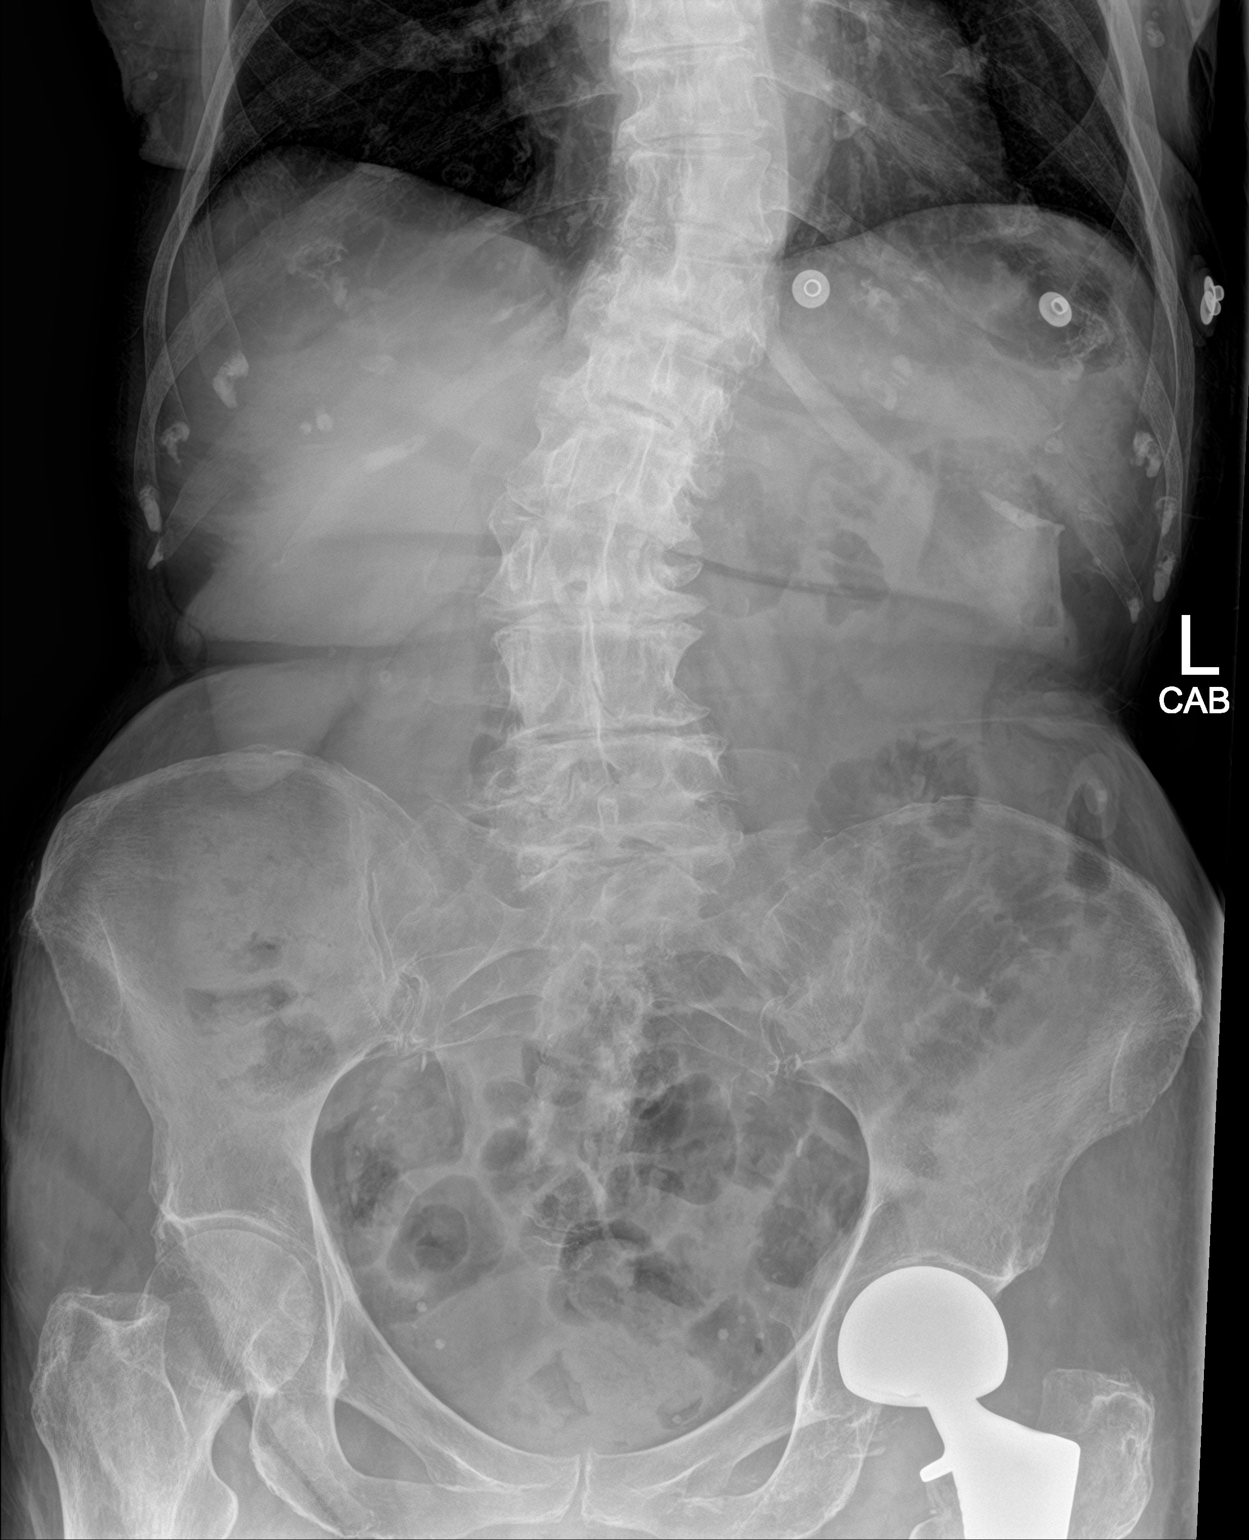

[chest ap]
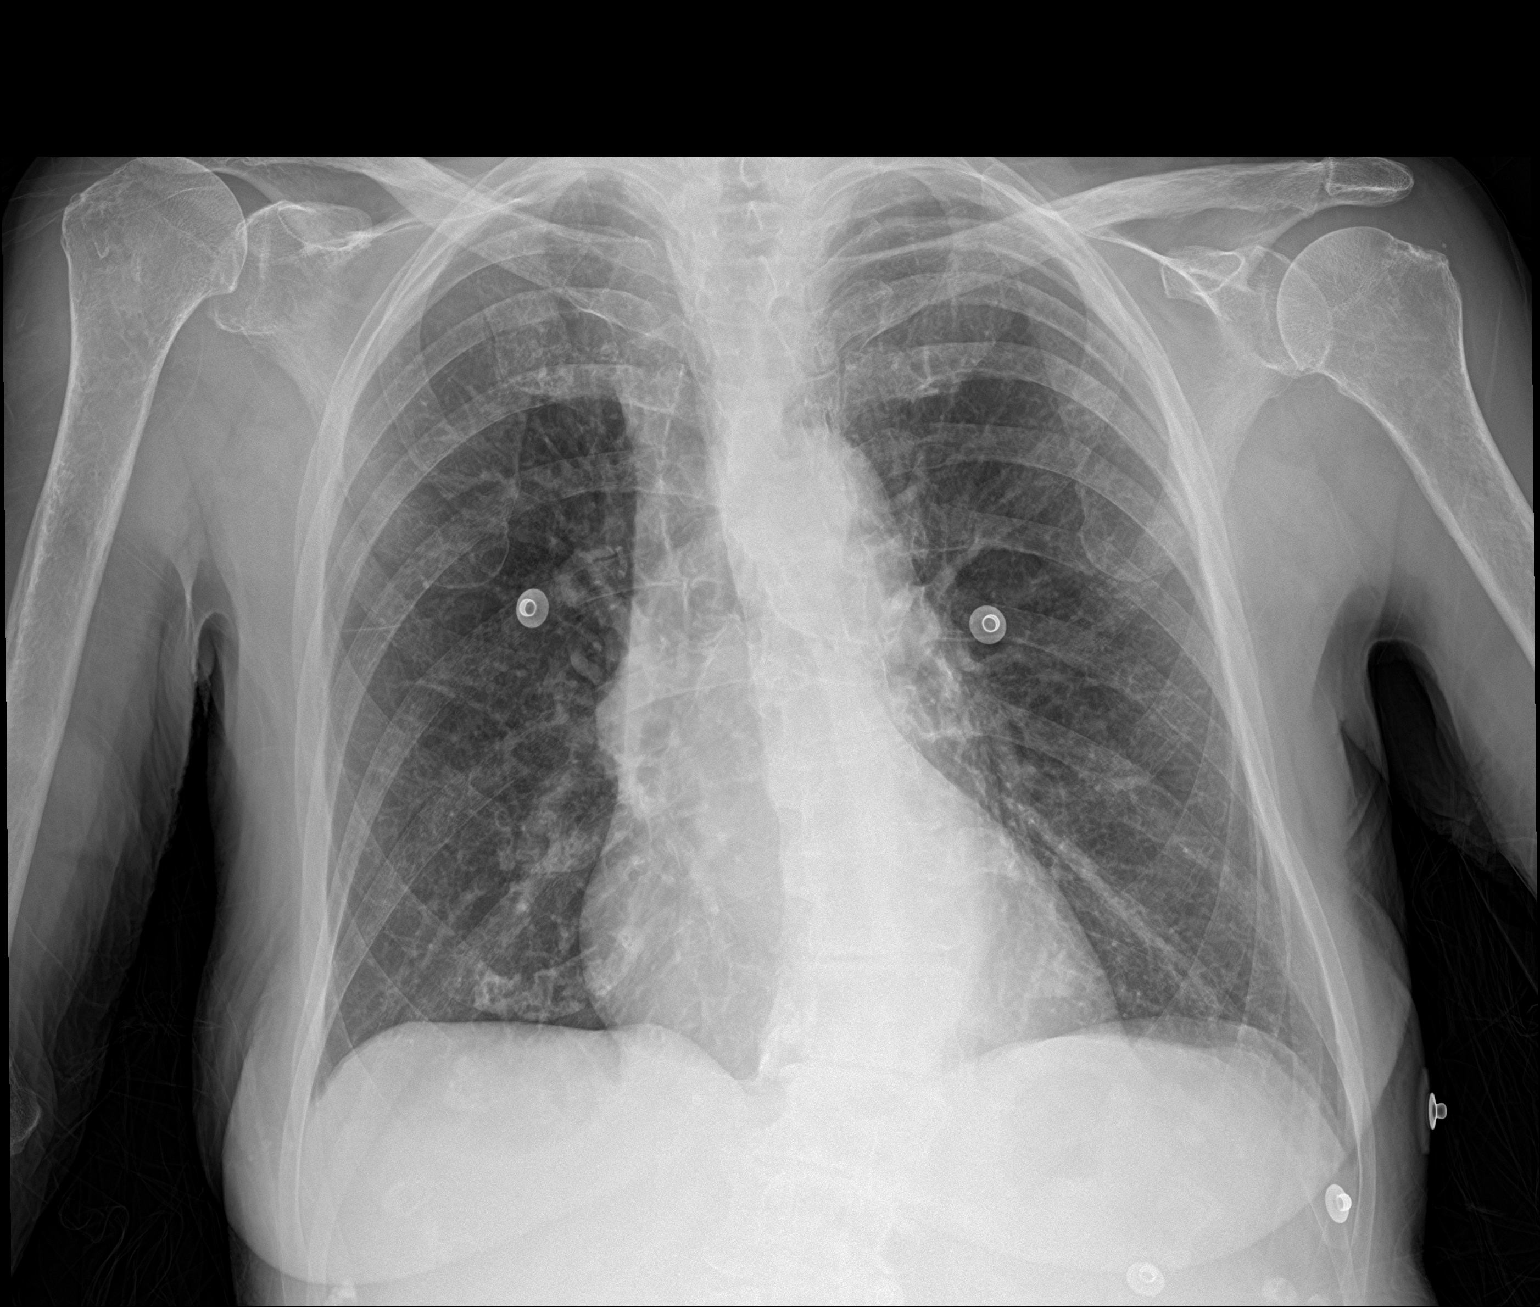

[3 of 3 positions shown; findings below may reference images not displayed]

FINDINGS: There is no evidence of dilated bowel loops or free intraperitoneal
air. No radiopaque calculi or other significant radiographic
abnormality is seen. Heart size and mediastinal contours are within
normal limits. Both lungs are clear.
IMPRESSION: Negative abdominal radiographs.  No acute cardiopulmonary disease.

## 2019-01-30 IMAGING — DX DG CHEST 1V PORT
1 series · 1 of 1 positions shown · non-contrast
Comparison: 08/13/2016

CLINICAL DATA: Chest pain x3 days and dyspnea

EXAM:
PORTABLE CHEST 1 VIEW

[chest ap]
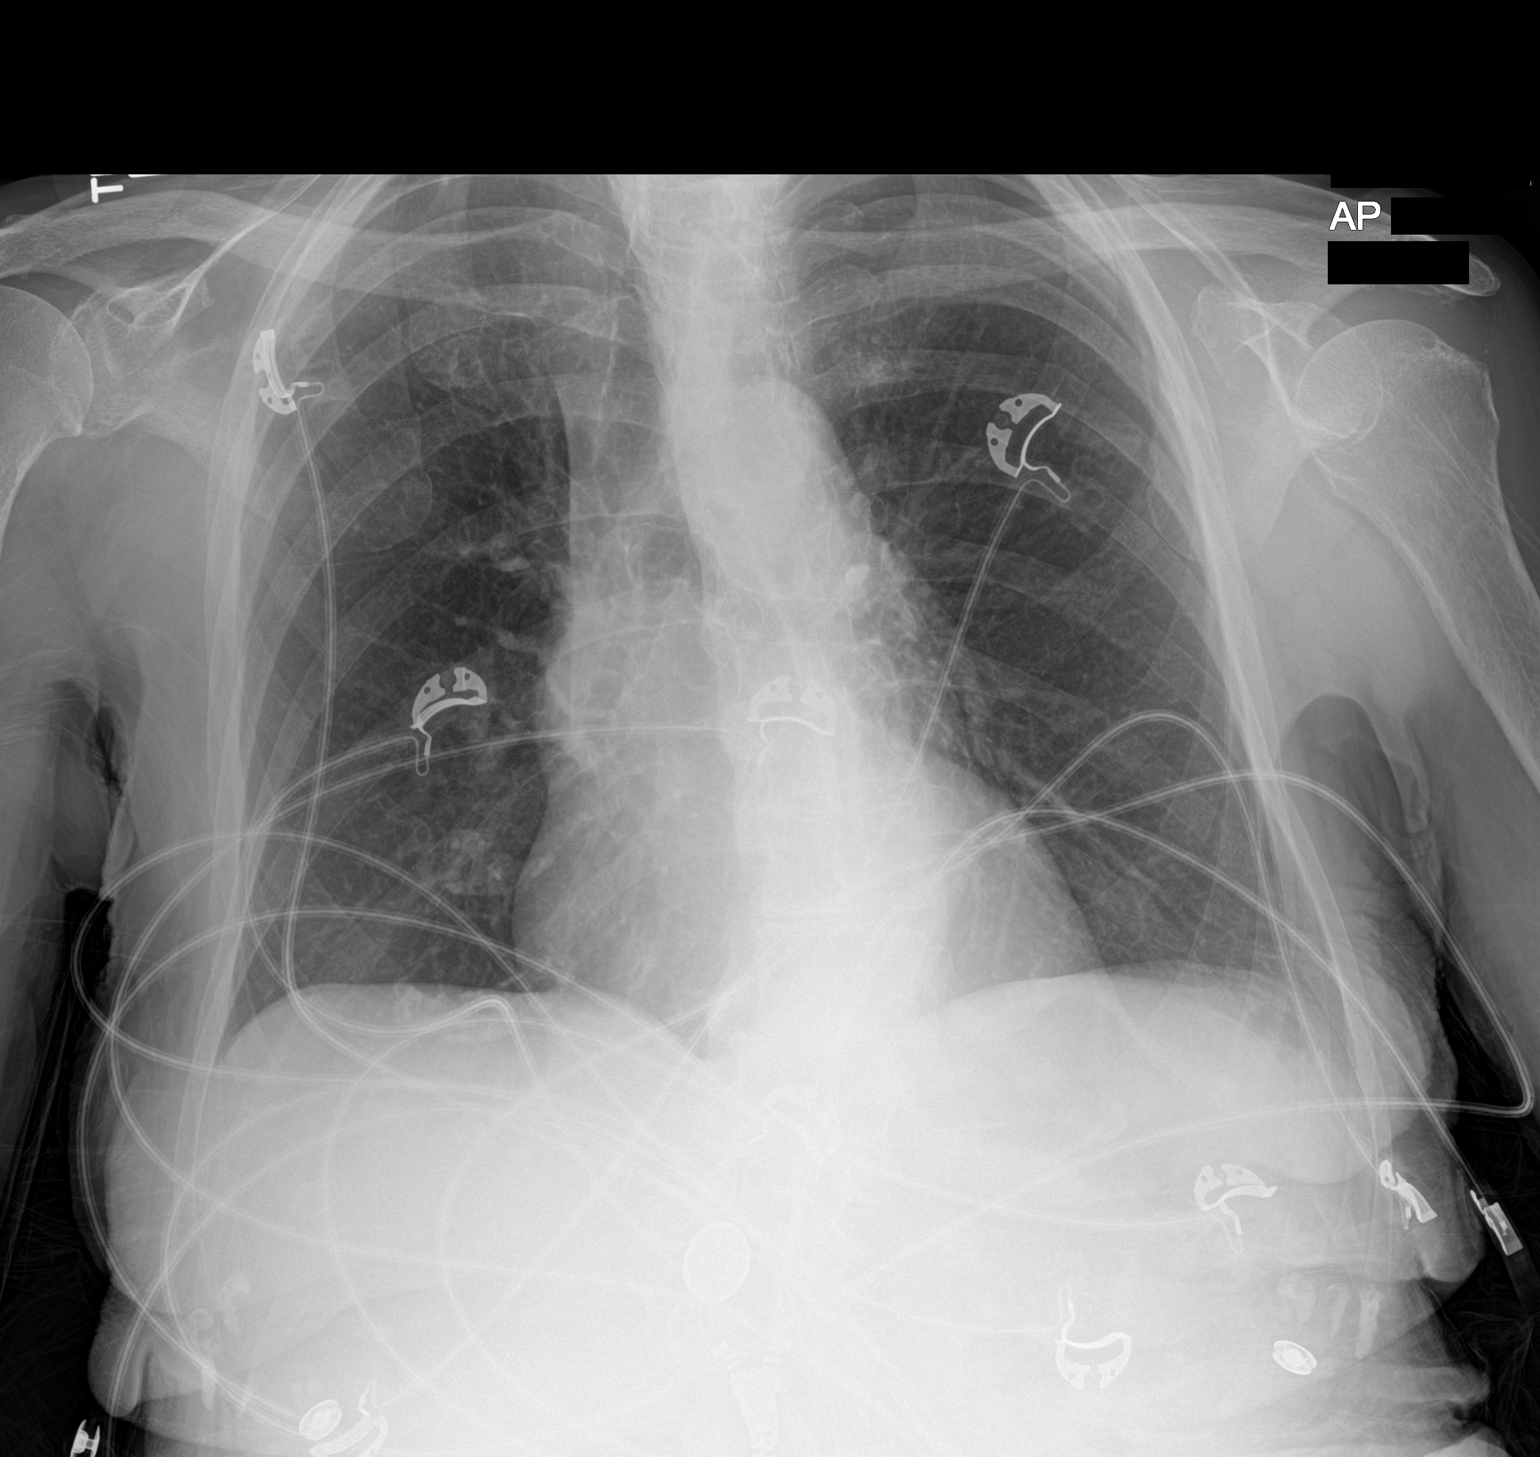

[1 of 1 positions shown; findings below may reference images not displayed]

FINDINGS: Stable cardiomegaly with aortic atherosclerosis. Clear lungs.
Scoliotic curvature of the lower thoracic spine convex to the left
is stable appearance.
IMPRESSION: 1. Cardiomegaly with aortic atherosclerosis.
2. No acute pulmonary disease.
3. Levoscoliosis of the lower thoracic spine, stable in appearance.
